# Patient Record
Sex: Male | Born: 1935 | Race: White | Hispanic: No | Marital: Married | Smoking: Never smoker
Health system: Southern US, Community
[De-identification: ages and names within clinical notes are randomized; demographics above are authoritative.]

## PROBLEM LIST (undated history)

## (undated) DIAGNOSIS — N183 Chronic kidney disease, stage 3 (moderate): Secondary | ICD-10-CM

## (undated) DIAGNOSIS — F0280 Dementia in other diseases classified elsewhere without behavioral disturbance: Secondary | ICD-10-CM

## (undated) DIAGNOSIS — Z951 Presence of aortocoronary bypass graft: Secondary | ICD-10-CM

## (undated) DIAGNOSIS — J9690 Respiratory failure, unspecified, unspecified whether with hypoxia or hypercapnia: Secondary | ICD-10-CM

## (undated) DIAGNOSIS — I779 Disorder of arteries and arterioles, unspecified: Secondary | ICD-10-CM

## (undated) DIAGNOSIS — R9430 Abnormal result of cardiovascular function study, unspecified: Secondary | ICD-10-CM

## (undated) DIAGNOSIS — E785 Hyperlipidemia, unspecified: Secondary | ICD-10-CM

## (undated) DIAGNOSIS — I509 Heart failure, unspecified: Secondary | ICD-10-CM

## (undated) DIAGNOSIS — I739 Peripheral vascular disease, unspecified: Secondary | ICD-10-CM

## (undated) DIAGNOSIS — G301 Alzheimer's disease with late onset: Secondary | ICD-10-CM

## (undated) DIAGNOSIS — E039 Hypothyroidism, unspecified: Secondary | ICD-10-CM

## (undated) DIAGNOSIS — E1140 Type 2 diabetes mellitus with diabetic neuropathy, unspecified: Secondary | ICD-10-CM

## (undated) DIAGNOSIS — I2510 Atherosclerotic heart disease of native coronary artery without angina pectoris: Secondary | ICD-10-CM

## (undated) DIAGNOSIS — R0602 Shortness of breath: Secondary | ICD-10-CM

## (undated) DIAGNOSIS — I10 Essential (primary) hypertension: Secondary | ICD-10-CM

## (undated) DIAGNOSIS — IMO0002 Reserved for concepts with insufficient information to code with codable children: Secondary | ICD-10-CM

## (undated) DIAGNOSIS — E119 Type 2 diabetes mellitus without complications: Secondary | ICD-10-CM

## (undated) HISTORY — DX: Dementia in other diseases classified elsewhere without behavioral disturbance: F02.80

## (undated) HISTORY — PX: CARPAL TUNNEL RELEASE: SHX101

## (undated) HISTORY — DX: Atherosclerotic heart disease of native coronary artery without angina pectoris: I25.10

## (undated) HISTORY — DX: Heart failure, unspecified: I50.9

## (undated) HISTORY — DX: Abnormal result of cardiovascular function study, unspecified: R94.30

## (undated) HISTORY — DX: Presence of aortocoronary bypass graft: Z95.1

## (undated) HISTORY — DX: Type 2 diabetes mellitus without complications: E11.9

## (undated) HISTORY — DX: Hyperlipidemia, unspecified: E78.5

## (undated) HISTORY — DX: Type 2 diabetes mellitus with diabetic neuropathy, unspecified: E11.40

## (undated) HISTORY — DX: Alzheimer's disease with late onset: G30.1

## (undated) HISTORY — DX: Reserved for concepts with insufficient information to code with codable children: IMO0002

## (undated) HISTORY — DX: Chronic kidney disease, stage 3 (moderate): N18.3

## (undated) HISTORY — DX: Respiratory failure, unspecified, unspecified whether with hypoxia or hypercapnia: J96.90

## (undated) HISTORY — PX: KNEE ARTHROPLASTY: SHX992

## (undated) HISTORY — DX: Hypothyroidism, unspecified: E03.9

## (undated) HISTORY — PX: CORONARY ARTERY BYPASS GRAFT: SHX141

## (undated) HISTORY — DX: Peripheral vascular disease, unspecified: I73.9

## (undated) HISTORY — DX: Shortness of breath: R06.02

## (undated) HISTORY — DX: Disorder of arteries and arterioles, unspecified: I77.9

## (undated) HISTORY — DX: Essential (primary) hypertension: I10

---

## 2000-03-29 DIAGNOSIS — I2510 Atherosclerotic heart disease of native coronary artery without angina pectoris: Secondary | ICD-10-CM

## 2000-03-29 HISTORY — DX: Atherosclerotic heart disease of native coronary artery without angina pectoris: I25.10

## 2000-04-22 ENCOUNTER — Ambulatory Visit (HOSPITAL_COMMUNITY): Admission: RE | Admit: 2000-04-22 | Discharge: 2000-04-22 | Payer: Self-pay | Admitting: Family Medicine

## 2000-04-22 ENCOUNTER — Encounter: Payer: Self-pay | Admitting: Family Medicine

## 2001-08-16 ENCOUNTER — Encounter: Payer: Self-pay | Admitting: Orthopedic Surgery

## 2001-08-16 ENCOUNTER — Encounter: Admission: RE | Admit: 2001-08-16 | Discharge: 2001-08-16 | Payer: Self-pay | Admitting: Orthopedic Surgery

## 2001-10-17 ENCOUNTER — Encounter: Payer: Self-pay | Admitting: Orthopedic Surgery

## 2001-10-26 ENCOUNTER — Inpatient Hospital Stay (HOSPITAL_COMMUNITY): Admission: RE | Admit: 2001-10-26 | Discharge: 2001-10-27 | Payer: Self-pay | Admitting: Orthopedic Surgery

## 2004-12-11 ENCOUNTER — Ambulatory Visit: Payer: Self-pay | Admitting: Cardiology

## 2006-04-28 ENCOUNTER — Ambulatory Visit: Payer: Self-pay | Admitting: Cardiology

## 2006-05-17 ENCOUNTER — Ambulatory Visit (HOSPITAL_BASED_OUTPATIENT_CLINIC_OR_DEPARTMENT_OTHER): Admission: RE | Admit: 2006-05-17 | Discharge: 2006-05-17 | Payer: Self-pay | Admitting: Urology

## 2006-06-30 ENCOUNTER — Inpatient Hospital Stay (HOSPITAL_COMMUNITY): Admission: EM | Admit: 2006-06-30 | Discharge: 2006-07-14 | Payer: Self-pay | Admitting: Emergency Medicine

## 2006-06-30 ENCOUNTER — Ambulatory Visit: Payer: Self-pay | Admitting: Cardiology

## 2006-06-30 ENCOUNTER — Ambulatory Visit: Payer: Self-pay | Admitting: Pulmonary Disease

## 2006-07-01 ENCOUNTER — Encounter: Payer: Self-pay | Admitting: Cardiology

## 2006-07-11 ENCOUNTER — Ambulatory Visit: Payer: Self-pay | Admitting: Physical Medicine & Rehabilitation

## 2006-08-09 ENCOUNTER — Encounter: Admission: RE | Admit: 2006-08-09 | Discharge: 2006-09-09 | Payer: Self-pay | Admitting: Family Medicine

## 2006-09-15 ENCOUNTER — Ambulatory Visit: Payer: Self-pay | Admitting: Critical Care Medicine

## 2007-04-14 ENCOUNTER — Ambulatory Visit (HOSPITAL_BASED_OUTPATIENT_CLINIC_OR_DEPARTMENT_OTHER): Admission: RE | Admit: 2007-04-14 | Discharge: 2007-04-14 | Payer: Self-pay | Admitting: Orthopedic Surgery

## 2007-06-02 ENCOUNTER — Ambulatory Visit (HOSPITAL_BASED_OUTPATIENT_CLINIC_OR_DEPARTMENT_OTHER): Admission: RE | Admit: 2007-06-02 | Discharge: 2007-06-02 | Payer: Self-pay | Admitting: Orthopedic Surgery

## 2007-06-13 ENCOUNTER — Ambulatory Visit: Payer: Self-pay | Admitting: Cardiology

## 2008-06-14 ENCOUNTER — Encounter: Payer: Self-pay | Admitting: Cardiology

## 2008-06-14 ENCOUNTER — Ambulatory Visit: Payer: Self-pay | Admitting: Cardiology

## 2008-11-19 ENCOUNTER — Encounter (INDEPENDENT_AMBULATORY_CARE_PROVIDER_SITE_OTHER): Payer: Self-pay | Admitting: *Deleted

## 2009-02-03 ENCOUNTER — Encounter: Payer: Self-pay | Admitting: Cardiology

## 2009-02-03 DIAGNOSIS — E119 Type 2 diabetes mellitus without complications: Secondary | ICD-10-CM | POA: Insufficient documentation

## 2009-02-04 ENCOUNTER — Ambulatory Visit: Payer: Self-pay | Admitting: Cardiology

## 2009-03-14 ENCOUNTER — Telehealth: Payer: Self-pay | Admitting: Cardiology

## 2009-03-31 ENCOUNTER — Ambulatory Visit: Payer: Self-pay | Admitting: Cardiology

## 2009-04-01 ENCOUNTER — Telehealth (INDEPENDENT_AMBULATORY_CARE_PROVIDER_SITE_OTHER): Payer: Self-pay | Admitting: *Deleted

## 2009-04-02 ENCOUNTER — Ambulatory Visit: Payer: Self-pay | Admitting: Cardiology

## 2009-04-02 ENCOUNTER — Ambulatory Visit: Payer: Self-pay

## 2009-04-02 ENCOUNTER — Encounter (HOSPITAL_COMMUNITY): Admission: RE | Admit: 2009-04-02 | Discharge: 2009-06-02 | Payer: Self-pay | Admitting: Cardiology

## 2009-04-24 ENCOUNTER — Ambulatory Visit: Payer: Self-pay | Admitting: Cardiology

## 2009-04-24 ENCOUNTER — Ambulatory Visit (HOSPITAL_COMMUNITY): Admission: RE | Admit: 2009-04-24 | Discharge: 2009-04-24 | Payer: Self-pay | Admitting: Cardiology

## 2009-04-24 ENCOUNTER — Ambulatory Visit: Payer: Self-pay

## 2009-04-24 ENCOUNTER — Encounter: Payer: Self-pay | Admitting: Cardiology

## 2010-04-14 ENCOUNTER — Encounter: Payer: Self-pay | Admitting: Cardiology

## 2010-04-15 ENCOUNTER — Encounter: Payer: Self-pay | Admitting: Cardiology

## 2010-04-15 ENCOUNTER — Ambulatory Visit
Admission: RE | Admit: 2010-04-15 | Discharge: 2010-04-15 | Payer: Self-pay | Source: Home / Self Care | Attending: Cardiology | Admitting: Cardiology

## 2010-04-27 ENCOUNTER — Encounter: Payer: Self-pay | Admitting: Cardiology

## 2010-04-27 ENCOUNTER — Ambulatory Visit: Admission: RE | Admit: 2010-04-27 | Discharge: 2010-04-27 | Payer: Self-pay | Source: Home / Self Care

## 2010-04-28 NOTE — Miscellaneous (Signed)
  Clinical Lists Changes  Problems: Added new problem of SHORTNESS OF BREATH (ICD-786.05) Added new problem of CORONARY ARTERY BYPASS GRAFT, HX OF (ICD-V45.81) Observations: Added new observation of PRIMARY MD: Paulita Cradle (03/31/2009 9:47)

## 2010-04-28 NOTE — Assessment & Plan Note (Signed)
Summary: F/U MYOVIEW 04-02-09  Medications Added METOPROLOL SUCCINATE 50 MG XR24H-TAB (METOPROLOL SUCCINATE) 1/2 tab two times a day      Allergies Added: NKDA  Visit Type:  Follow-up Primary Provider:  Paulita Cradle FNP   History of Present Illness: Patient is seen for cardiology followup.  He had some shortness of breath.  We know he has coronary disease.  He underwent a nuclear exercise test.  This showed no ischemia.  There was questionable wall motion abnormality.  2-D echo was done to reassess LV function and valvular function.  I reviewed this study myself today.  Patient has some mild dyskinesis of the basal posterior wall.  The other walls move well.  The ejection fraction is well maintained.  There are no major valvular abnormalities.  Patient also had a chest x-ray after the last visit.  There were no acute abnormalities.  Current Medications (verified): 1)  Lovastatin 20 Mg Tabs (Lovastatin) .... Take 1 Tab By Mouth Daily 2)  Glimepiride 2 Mg Tabs (Glimepiride) .Marland Kitchen.. 1 By Mouth Daily 3)  Metoprolol Succinate 50 Mg Xr24h-Tab (Metoprolol Succinate) .... 1/2 Tab Two Times A Day 4)  Multivitamins   Tabs (Multiple Vitamin) .Marland Kitchen.. 1 Tab Once Daily 5)  Aspirin 325 Mg  Tabs (Aspirin) .... Qd  Allergies (verified): No Known Drug Allergies  Past History:  Past Medical History: Hypertension Hyperlipidemia CAD   ... Cardiolite... 2002.. no ischemia /  nuclear.. January, 2011... no ischemia EF 50-55%... echo... April, 2008  /  echo.... January, 2011   report pending CABG... 1998 Diabetes diabetic neuropathy. Marland Kitchen.Possible vent dependent respiratory failure.... acquired pneumonia... 2008     Review of Systems       Patient denies fever, chills, headache, rash, sweats, change in vision, change in hearing, chest pain, nausea vomiting, urinary symptoms, musculoskeletal problems.  He does admit that he is gaining weight.  All of the systems are reviewed and are negative.  Vital  Signs:  Patient profile:   77 year old male Height:      69 inches Weight:      214 pounds Pulse rate:   64 / minute BP sitting:   138 / 68  (left arm) Cuff size:   regular  Vitals Entered By: Hardin Negus, RMA (April 24, 2009 11:11 AM)  Physical Exam  General:  patient is very stable in general. Eyes:  no xanthelasma. Neck:  no jugular venous distention. Lungs:  lungs are clear.  Respiratory effort is nonlabored. Heart:  cardiac exam reveals S1-S2.  There are no clicks or significant murmurs. Abdomen:  abdomen is soft.  He has gained weight. Extremities:  no peripheral edema. Psych:  patient is oriented to person time and place.  Affect is normal.  He is here with his son today.   Impression & Recommendations:  Problem # 1:  SHORTNESS OF BREATH (ICD-786.05)  His updated medication list for this problem includes:    Metoprolol Succinate 50 Mg Xr24h-tab (Metoprolol succinate) .Marland Kitchen... 1/2 tab two times a day    Aspirin 325 Mg Tabs (Aspirin) ..... Qd I have discussed the findings of the nuclear scan and the echo and the x-ray with the patient and his son.  There is no obvious cardiac problem at this time.  I've encouraged him to lose weight and to increase his activity.  I see him back in one year.  Problem # 2:  ESSENTIAL HYPERTENSION, BENIGN (ICD-401.1)  His updated medication list for this problem includes:  Metoprolol Succinate 50 Mg Xr24h-tab (Metoprolol succinate) .Marland Kitchen... 1/2 tab two times a day    Aspirin 325 Mg Tabs (Aspirin) ..... Qd Blood pressure is controlled today.  No change in therapy.  Problem # 3:  HYPERLIPIDEMIA (ICD-272.4)  The following medications were removed from the medication list:    Lipitor 40 Mg Tabs (Atorvastatin calcium) .Marland Kitchen... 1 tab once daily His updated medication list for this problem includes:    Lovastatin 20 Mg Tabs (Lovastatin) .Marland Kitchen... Take 1 tab by mouth daily Patient is receiving medication for his lipids.  Patient Instructions: 1)   Follow up in 1 year

## 2010-04-28 NOTE — Assessment & Plan Note (Signed)
Summary: Cardiology Nuclear Study  Nuclear Med Background Indications for Stress Test: Evaluation for Ischemia, Graft Patency   History: CABG, Echo, Heart Catheterization, Myocardial Perfusion Study  History Comments: '98 CABG x 5; '02 [id number],  EF=54%; '08 Echo:EF=50-55%  Symptoms: DOE, Fatigue, SOB    Nuclear Pre-Procedure Cardiac Risk Factors: Family History - CAD, Hypertension, Lipids, NIDDM, Obesity Caffeine/Decaff Intake: None NPO After: 8:30 PM Lungs: Clear IV 0.9% NS with Angio Cath: 22g     IV Site: (R) AC IV Started by: Irean Hong RN Chest Size (in) 44     Height (in): 69 Weight (lb): 210 BMI: 31.12  Nuclear Med Study 1 or 2 day study:  1 day     Stress Test Type:  Eugenie Birks Reading MD:  Marca Ancona, MD     Referring MD:  Willa Rough, MD Resting Radionuclide:  Technetium 36m Tetrofosmin     Resting Radionuclide Dose:  10.8 mCi  Stress Radionuclide:  Technetium 87m Tetrofosmin     Stress Radionuclide Dose:  33.0 mCi   Stress Protocol      Predicted Max HR:  132 bpm   Lexiscan: 0.4 mg   Stress Test Technologist:  Rea College CMA-N     Nuclear Technologist:  Burna Mortimer Deal RT-N  Rest Procedure  Myocardial perfusion imaging was performed at rest 45 minutes following the intravenous administration of Myoview Technetium 49m Tetrofosmin.  Stress Procedure  The patient received IV Lexiscan 0.4 mg over 15-seconds.  Myoview injected at 30-seconds.  There were no significant changes with infusion.  Quantitative spect images were obtained after a 45 minute delay.  QPS Raw Data Images:  Normal; no motion artifact; normal heart/lung ratio. Stress Images:  NI: Uniform and normal uptake of tracer in all myocardial segments. Rest Images:  Normal homogeneous uptake in all areas of the myocardium. Subtraction (SDS):  There is no evidence of scar or ischemia. Transient Ischemic Dilatation:  .98  (Normal <1.22)  Lung/Heart Ratio:  .32  (Normal <0.45)  Quantitative  Gated Spect Images QGS EDV:  102 ml QGS ESV:  52 ml QGS EF:  49 % QGS cine images:  Inferior and inferoseptal hypokinesis.    Overall Impression  Exercise Capacity: Lexiscan study.  BP Response: Normal blood pressure response. Clinical Symptoms: Nausea ECG Impression: No significant ST segment change suggestive of ischemia. Overall Impression: No ischemia or infarction on perfusion images.  Gated images show mildly decreased systolic function with inferior and inferoseptal hypokinesis.  Overall Impression Comments: Suggest echo to further assess LV function and wall motion.   Appended Document: Cardiology Nuclear Study [place]  Appended Document: Cardiology Nuclear Study pt aware, will order echo   Clinical Lists Changes  Orders: Added new Referral order of Echocardiogram (Echo) - Signed

## 2010-04-28 NOTE — Miscellaneous (Signed)
Summary: Orders Update  Clinical Lists Changes  Orders: Added new Test order of T-2 View CXR (71020TC) - Signed 

## 2010-04-28 NOTE — Progress Notes (Signed)
Summary: Nuclear Pre-Procedure  Phone Note Outgoing Call Call back at [facility] Phone [phone]   Call placed by: Stanton Kidney, EMT-P,  April 01, 2009 12:07 PM Call placed to: Patient Action Taken: Phone Call Completed Summary of Call: Reviewed information on Myoview Information Sheet (see scanned document for further details).  Spoke with Patient.    Nuclear Med Background Indications for Stress Test: Evaluation for Ischemia, Graft Patency   History: CABG, Echo, Myocardial Perfusion Study  History Comments: '98 CABG '02 MPS: EF=54%, (-) ischemisa '08 Echo: EF=50-55%  Symptoms: DOE, SOB    Nuclear Pre-Procedure Cardiac Risk Factors: Family History - CAD, Hypertension, Lipids, NIDDM Height (in): 69

## 2010-04-28 NOTE — Assessment & Plan Note (Signed)
Summary: c/o sob      Allergies Added: NKDA  Visit Type:  Follow-up Primary Provider:  Paulita Cradle FNP  CC:  shortness of breath.  History of Present Illness: The patient is here for cardiology followup.  He has coronary disease.  He has had some shortness of breath.  He is here with a younger brother who says that his suppressed limited.  The patient has not been having any chest pain.  He is a heart surgery was done in 1998.  Ejection fraction was 5055% in April, 2008.  He did have an episode of vent dependent respiratory failure with acquired pneumonia in 2008. He is not having any chest discomfort.   Current Medications (verified): 1)  Lovastatin 20 Mg Tabs (Lovastatin) .... Take 1 Tab By Mouth Daily 2)  Glimepiride 2 Mg Tabs (Glimepiride) .Marland Kitchen.. 1 By Mouth Daily 3)  Metoprolol Succinate 50 Mg Xr24h-Tab (Metoprolol Succinate) .... 1/2 Tab Once Daily 4)  Multivitamins   Tabs (Multiple Vitamin) .Marland Kitchen.. 1 Tab Once Daily 5)  Aspirin 325 Mg  Tabs (Aspirin) .... Qd 6)  Lipitor 40 Mg Tabs (Atorvastatin Calcium) .Marland Kitchen.. 1 Tab Once Daily 7)  Tylenol 325 Mg Tabs (Acetaminophen) .... As Needed  Allergies (verified): No Known Drug Allergies  Past History:  Past Medical History: Last updated: 02/03/2009 Hypertension Hyperlipidemia CAD   ... Cardiolite... 2002.. no ischemia EF 50-55%... echo... April, 2008 CABG... 1998 Diabetes diabetic neuropathy. Marland Kitchen.Possible vent dependent respiratory failure.... acquired pneumonia... 2008     Review of Systems       Patient denies fever, chills, headache, sweats, rash, change in vision, change in hearing, chest pain, cough, nausea vomiting, urinary symptoms.  He is not complaining of musculoskeletal problems.  All other systems are reviewed and are negative.  Vital Signs:  Patient profile:   77 year old male Height:      69 inches Weight:      212 pounds Pulse rate:   69 / minute BP sitting:   124 / 66  (left arm) Cuff size:   large  Vitals  Entered By: Oswald Hillock (March 31, 2009 2:27 PM)  Physical Exam  General:  The patient is stable but overweight. Head:  head is atraumatic. Eyes:  no xanthelasma. Neck:  no jugular venous distention. Chest Wall:  no chest wall tenderness. Lungs:  lungs are clear.  Respiratory effort is nonlabored. Heart:  cardiac exam reveals S1 and S2.  No clicks or significant murmurs. Abdomen:  abdomen is obese but soft. Msk:  no musculoskeletal deformities. Extremities:  no peripheral edema. Skin:  no skin rashes. Psych:  patient is oriented to person time and place affect is normal.   Impression & Recommendations:  Problem # 1:  CORONARY ARTERY BYPASS GRAFT, HX OF (ICD-V45.81)  The patient has coronary disease.  He has not been having any chest pain.  His ejection fraction was normal 2 years ago.  Orders: Nuclear Stress Test (Nuc Stress Test)  Problem # 2:  SHORTNESS OF BREATH (ICD-786.05)  At this point etiology of his shortness of breath is not clear.  This may well be a limitation from pulmonary disease.  However he does have significant coronary disease by history and his symptoms are exertional.  I carefully reviewed the pros and cons of proceeding with exercise testing.  I do believe that we should do a pharmacologic scan to be sure that we are not dealing with significant ischemia.  This will be arranged he'll also have a  chest x-ray.  He'll then see him for follow up.  Orders: Nuclear Stress Test (Nuc Stress Test)  Patient Instructions: 1)  Your physician has requested that you have a Lexiscan  myoview.  For further information please visit [email or web address]  Please follow instruction sheet, as given. 2)  A chest x-ray takes a picture of the organs and structures inside the chest, including the heart, lungs, and blood vessels. This test can show several things, including, whether the heart is enlarged; whether fluid is building up in the lungs; and whether pacemaker /  defibrillator leads are still in place.  Will need xray same day as myoview. 3)  Follow up in late January

## 2010-04-28 NOTE — Miscellaneous (Signed)
Summary: Outpatient Coinsurance Notice  Outpatient Coinsurance Notice   Imported By: Marylou Mccoy 04/08/2009 16:03:11  _____________________________________________________________________  External Attachment:    Type:   Image     Comment:   External Document

## 2010-04-30 NOTE — Miscellaneous (Signed)
  Clinical Lists Changes  Observations: Added new observation of PAST MED HX: Hypertension Hyperlipidemia CAD   ... Cardiolite... 2002.. no ischemia /  nuclear.. January, 2011... no ischemia EF 50-55%... echo... April, 2008  /  echo.... January, 2011.Marland KitchenMarland KitchenEF 50-55%.. hypokinesis  mid-basal inferoposterior  CABG... 1998 Diabetes diabetic neuropathy. Marland Kitchen.Possible vent dependent respiratory failure.... acquired pneumonia... 2008    (04/14/2010 16:18) Added new observation of PRIMARY MD: Paulita Cradle FNP (04/14/2010 16:18)       Past History:  Past Medical History: Hypertension Hyperlipidemia CAD   ... Cardiolite... 2002.. no ischemia /  nuclear.. January, 2011... no ischemia EF 50-55%... echo... April, 2008  /  echo.... January, 2011.Marland KitchenMarland KitchenEF 50-55%.. hypokinesis  mid-basal inferoposterior  CABG... 1998 Diabetes diabetic neuropathy. Marland Kitchen.Possible vent dependent respiratory failure.... acquired pneumonia... 2008

## 2010-04-30 NOTE — Assessment & Plan Note (Signed)
Summary: f1y  Medications Added ETODOLAC 200 MG CAPS (ETODOLAC) as needed      Allergies Added: NKDA  Visit Type:  Follow-up Primary Provider:  Paulita Cradle FNP  CC:  CAD.  History of Present Illness: The patient is doing remarkably well.  He's had some slight dizziness when standing up.  He's not had any chest pain.  There's been no significant shortness of breath and I saw him last January, 2011.  He had some shortness of breath and we reevaluated him to be sure that it was not ischemia.  He had a nuclear scan showing no ischemia.  2-D echo revealed good LV function overall with an ejection fraction of 50-55% there was hypokinesis of the mid and basal inferoposterior wall.  Current Medications (verified): 1)  Lovastatin 20 Mg Tabs (Lovastatin) .... Take 1 Tab By Mouth Daily 2)  Glimepiride 2 Mg Tabs (Glimepiride) .Marland Kitchen.. 1 By Mouth Daily 3)  Metoprolol Succinate 50 Mg Xr24h-Tab (Metoprolol Succinate) .... 1/2 Tab Two Times A Day 4)  Multivitamins   Tabs (Multiple Vitamin) .Marland Kitchen.. 1 Tab Once Daily 5)  Aspirin 325 Mg  Tabs (Aspirin) .... Qd 6)  Etodolac 200 Mg Caps (Etodolac) .... As Needed  Allergies (verified): No Known Drug Allergies  Past History:  Past Medical History: Hypertension Hyperlipidemia CAD   ... Cardiolite... 2002.. no ischemia /  nuclear.. January, 2011... no ischemia EF 50-55%... echo... April, 2008  /  echo.... January, 2011.Marland KitchenMarland KitchenEF 50-55%.. hypokinesis  mid-basal inferoposterior  CABG... 1998 Diabetes diabetic neuropathy. Marland Kitchen.Possible vent dependent respiratory failure.... acquired pneumonia... 2008 Soft right carotid bruit     Past Surgical History: CABG Knee Arthroplasty-Total  Review of Systems       Patient denies fever, chills, headache, sweats, rash, change in vision, change in hearing, chest pain, cough, nausea vomiting, urinary symptoms.  All other systems are reviewed and are negative.  Vital Signs:  Patient profile:   77 year old male Height:       69 inches Weight:      214 pounds BMI:     31.72 Pulse rate:   54 / minute BP sitting:   136 / 64  (left arm) Cuff size:   large  Vitals Entered By: Hardin Negus, RMA (April 15, 2010 11:16 AM)  Physical Exam  General:  patient looks great. Head:  head is atraumatic. Eyes:  no xanthelasma. Neck:  no jugular venous distention.  There is a soft right carotid bruit. Chest Wall:  no chest wall tenderness. Lungs:  lungs are clear.  Respiratory effort is nonlabored. Heart:  cardiac exam reveals S1 and S2.  No clicks or significant murmurs. Abdomen:  abdomen is soft. Msk:  no musculoskeletal deformities. Extremities:  no peripheral edema. Skin:  no skin rashes. Psych:  patient is oriented to person time and place.  Affect is normal.   Impression & Recommendations:  Problem # 1:  * SOFT RIGHT CAROTID BRUIT The patient has known vascular disease.  I cannot document when a carotid Doppler was done last.  He is fully active.  Carotid Doppler will be done. I will be in touch with him with the results.  Problem # 2:  SHORTNESS OF BREATH (ICD-786.05)  His updated medication list for this problem includes:    Metoprolol Succinate 50 Mg Xr24h-tab (Metoprolol succinate) .Marland Kitchen... 1/2 tab two times a day    Aspirin 325 Mg Tabs (Aspirin) ..... Qd He is not having any significant shortness of breath.  No further workup.  Problem # 3:  ESSENTIAL HYPERTENSION, BENIGN (ICD-401.1)  His updated medication list for this problem includes:    Metoprolol Succinate 50 Mg Xr24h-tab (Metoprolol succinate) .Marland Kitchen... 1/2 tab two times a day    Aspirin 325 Mg Tabs (Aspirin) ..... Qd Blood pressure is well-controlled.  No change in therapy.  Problem # 4:  HYPERLIPIDEMIA (ICD-272.4)  His updated medication list for this problem includes:    Lovastatin 20 Mg Tabs (Lovastatin) .Marland Kitchen... Take 1 tab by mouth daily Lipids are treated.  No change in therapy.  Problem # 5:  CAD (ICD-414.00)  His updated  medication list for this problem includes:    Metoprolol Succinate 50 Mg Xr24h-tab (Metoprolol succinate) .Marland Kitchen... 1/2 tab two times a day    Aspirin 325 Mg Tabs (Aspirin) ..... Qd  Orders: EKG w/ Interpretation (93000) Coronary disease is stable.  EKG is done today and reviewed by me.  There is sinus bradycardia.  There are nonspecific ST-T wave changes which are unchanged.  He is not having any symptoms of bradycardia.  I've chosen not to change his beta blocker dose.  We'll see him back for cardiology follow up in one year.  Other Orders: Carotid Duplex (Carotid Duplex)  Patient Instructions: 1)  Your physician has requested that you have a carotid duplex. This test is an ultrasound of the carotid arteries in your neck. It looks at blood flow through these arteries that supply the brain with blood. Allow one hour for this exam. There are no restrictions or special instructions. 2)  Your physician wants you to follow-up in: 1 year.  You will receive a reminder letter in the mail two months in advance. If you don't receive a letter, please call our office to schedule the follow-up appointment.

## 2010-08-11 NOTE — Assessment & Plan Note (Signed)
[facility]                            CARDIOLOGY OFFICE NOTE   NAME:Canning, SHAWNA KIENER                        MRN:          [id number]  DATE:06/13/2007                            DOB:          07/07/1918    Mr. Macari a returns today for cardiology follow-up.  He is a delightful  77 year old gentleman.  I had seen him last in January 2008 and he is  here for yearly cardiology follow-up.  He underwent CABG in 1998.  He  had a Cardiolite in 2002.  He continues to remain active.  He had had a  major illness in the spring of 2008 with a vent-dependent community-  acquired pneumonia and many other complicating issues.  He stabilized.  He is here today looking well.  He traveled and brought himself by car  from [place].   He is not having any chest pain.  There has been no marked shortness of  breath, syncope or presyncope.  He mentions that he has trace edema at  times.   PAST MEDICAL HISTORY:  Allergies:  No known drug allergies.   Medications:  1. Metoprolol 25 mg b.i.d.  2. Lipitor 20 mg.  3. Glimepiride 2 mg.  4. Once a day vitamin.  5. Ecotrin 325 mg.   Other medical problems:  See the list below.   REVIEW OF SYSTEMS:  Overall he is doing well.  His review of systems is  negative.   PHYSICAL EXAM:  Blood pressure 140/72 with a pulse of 60.  His weight is  205 pounds.  This is a reasonable weight for him.  He is up a few pounds  from his last visit.  Certainly, losing weight overall would be good for  him.  The patient is oriented to person, time and place.  Affect is normal.  HEENT:  No xanthelasma.  He has normal extraocular motion.  LUNGS:  Clear.  Respiratory effort is not labored.  CARDIAC:  An S1 with an S2.  There are no clicks or significant murmurs.  He did have some irregularity to the rhythm.  ABDOMEN:  Soft.  He had no significant peripheral edema.   EKG revealed that he was in sinus rhythm.  He does have PVCs.  He has  old  diffuse ST-T wave changes.   PROBLEMS:  1. Hypertension, treated.  2. Hyperlipidemia, treated.  3. Coronary disease, post coronary artery bypass grafting in 1998 and      a Cardiolite in 2002 that looked good.  4. Diabetes, treated.  5. Possible diabetic neuropathy.  6. Significant pulmonary illness in the spring of 2008.   Overall he is doing well.  No change in his therapy.  I will see him  back in a year.     Luis Abed, MD, [facility]  Electronically Signed    JDK/MedQ  DD: 06/14/2007  DT: 06/14/2007  Job #: [id number]   cc:   Ernestina Penna, M.D.

## 2010-08-11 NOTE — Op Note (Signed)
NAME:  [name] [name], [name] [name] NO.:  [id number]   MEDICAL RECORD NO.:  [id number]          PATIENT TYPE:  AMB   LOCATION:  DSC                          FACILITY:  [facility]   PHYSICIAN:  Katy Fitch. Sypher, M.D. DATE OF BIRTH:  June 22, 1918   DATE OF PROCEDURE:  06/02/2007  DATE OF DISCHARGE:                               OPERATIVE REPORT   PREOPERATIVE DIAGNOSIS:  Chronic left median entrapment neuropathy at  carpal tunnel.   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   OPERATION:  Release of left transverse carpal ligament.   SURGEON:  Katy Fitch. Sypher, M.D.   ASSISTANT:  [name] [name], P.A.-C.   ANESTHESIA:  IV regional.   SUPERVISING ANESTHESIOLOGIST:  Janetta Hora. Gelene Mink, M.D.   INDICATIONS FOR PROCEDURE:  [name] [name] is an 77 year old gentleman  referred by [facility] for evaluation and  management of painful left carpal tunnel syndrome.  He has a history of  significant hand numbness and stiffness.  He is status post release of  his right transverse carpal ligament on April 14, 2007, with  satisfactory short term results.  He was noted to have a persistent  median artery on the right.  He now presents for identical surgery on  the left. Preoperatively, he was reminded of the potential risks and  benefits of surgery.  After his right side surgery, he only used a  minimal amount of pain medication and still has the bulk of his pain  prescription medication provided in January.  He is brought to the  operating room at this time.   PROCEDURE:  [name] [name] is brought to the operating room and placed  in the supine position upon the operating table.  An IV regional block  was placed on the left utilizing a tourniquet pressure between 300 and  320 mmHg due to systolic hypertension and presumed vascular  calcification.  After ten minutes, satisfactory anesthesia was achieved.  Similar to the  right side, we proceed with a short incision in the line  of the ring finger in the palm.  The subcutaneous tissues were carefully  divided around the palmar fascia.  This was carefully released with  scissors, revealing the common sensory branch of the median nerve and  superficial palmar arch.   The common sensory branches were followed back to the median nerve  proper.  Once again, there was noted to be a persistent median artery  and accompanying veins.  A [place] 4 elevator was used to sound the  carpal canal followed by careful release of the transverse carpal  ligament along its ulnar border.  Bleeding points along the margin of  the released ligament and transverse vessels were electrocauterized with  bipolar forceps.  The volar forearm fascia was, likewise, released  subcutaneously extending into the distal forearm.  This widely opened  the carpal canal.  No mass or other predicaments noted.  The wound was  carefully inspected for bleeding points and  hemostasis achieved.  The wound was then repaired with intradermal 3-0  Prolene  and Steri-Strips.  2% lidocaine was infiltrated along the wound  margins for postoperative analgesia.  [name] was awakened from his  sedation and transferred to the recovery room with stable vital signs.      Katy Fitch Sypher, M.D.  Electronically Signed     RVS/MEDQ  D:  06/02/2007  T:  06/02/2007  Job:  [id number]

## 2010-08-11 NOTE — Op Note (Signed)
NAME:  [name] [name], ADD DINAPOLI NO.:  [id number]   MEDICAL RECORD NO.:  [id number]          PATIENT TYPE:  AMB   LOCATION:  DSC                          FACILITY:  [facility]   PHYSICIAN:  Katy Fitch. Sypher, M.D. DATE OF BIRTH:  03/03/1919   DATE OF PROCEDURE:  04/14/2007  DATE OF DISCHARGE:                               OPERATIVE REPORT   PREOPERATIVE DIAGNOSIS:  Severe right carpal tunnel syndrome.   POSTOPERATIVE DIAGNOSIS:  Severe right carpal tunnel syndrome.   OPERATION:  Release of right transverse carpal ligament.   OPERATING SURGEON:  Katy Fitch. Sypher, M.D.   ASSISTANT:  Marveen Reeks. Dasnoit PA-C.   ANESTHESIA:  IV regional, supplemented by IV sedation.   SUPERVISING ANESTHESIOLOGIST:  Judie Petit, M.D.   INDICATIONS:  Kalmen Gallego is an 77 year old gentleman referred through  the courtesy of [facility] for evaluation of  severe hand numbness and pain.  Clinical examination revealed thenar  atrophy and signs of profound carpal tunnel syndrome.  Electrodiagnostic  studies confirmed very severe right carpal tunnel syndrome.  This has  been unresponsive to splinting and activity modification, as well as  steroid injection into the ulnar bursa.   Due to a failure to respond to nonoperative measures, Mr. Oran is  brought to the operating room at this time for release of his right  transverse carpal ligament.   PROCEDURE:  Leveon Stroschein was brought to the operating room and placed in  the supine position upon the operating table.  Following placement of an  IV regional block by the anesthesia service, satisfactory anesthesia was  achieved within 10 minutes.  The right arm was prepped with Betadine  soap and solution and sterilely draped.  A tourniquet pressure of 300  mmHg was required due to systolic hypertension and probable vascular  calcification.   When anesthesia was satisfactory, the procedure commenced with a short  incision in the  line of the ring finger in the palm.  Subcutaneous  tissues were carefully divided, revealing the palmar fascia.  This was  split longitudinally to the common sensory branch of the median nerve.  These were followed back to the transverse carpal ligament, which was  gently isolated from the median nerve.  There was noted to be a  persistent median artery and accompanying veins on the volar surface of  the nerve.  The transverse carpal ligament was released along its ulnar  border, extending into the distal forearm.  This widely opened the  carpal canal.  The tenosynovium of the ulnar bursa was quite edematous  and fibrotic.  Bleeding points along the margin of the released ligament  were electrocauterized with bipolar current, followed by repair of the  skin with intradermal 3-0 Prolene suture.  A compressive dressing was  applied with a volar plaster splint, maintaining the wrist in 5 degrees  of dorsiflexion.   After 20 minutes of tourniquet time, the tourniquet that was released,  with immediate capillary refill to the fingers and thumb.  Mr. Briddell  tolerated the surgery and anesthesia well.  He will be transferred  to  recovery, and then home to the care of his son, with a prescription for  Vicodin 5 mg 1 p.o. q.4-6 h. p.r.n. pain, 20 tablets, without refill.   I will see him back for followup in 1 week for a dressing change and  initiation of an excise program.  We may begin therapy at approximately  2 weeks.      Katy Fitch Sypher, M.D.  Electronically Signed     RVS/MEDQ  D:  04/14/2007  T:  04/14/2007  Job:  [id number]   cc:   Ernestina Penna, M.D.

## 2010-08-11 NOTE — Assessment & Plan Note (Signed)
[facility]                             PULMONARY OFFICE NOTE   NAME:[name] [name], [name] [name]                        MRN:          [id number]  DATE:09/15/2006                            DOB:          1918-04-30    Mr. Dezeeuw is an 77 year old white male seen today as post-hospital  followup.  He was hospitalized between April 3 -17 with acute  respiratory failure, vent-dependent; community-acquired pneumonia;  pulmonary edema; hypertension, renal insufficiency; diabetes mellitus.  He has significant deconditioning following his extubation and was sent  to [facility].  He rehabbed there and recently was  discharged from this facility and now has a maintenance program  available to him.  He is off all pulmonary medications.  Maintains  metoprolol 25 mg b.i.d., Lipitor 20 mg daily, glimepiride daily,  vitamins daily, Ecotrin 325 mg daily.  Overall he has been doing well  with less shortness of breath, decreased cough.   ON EXAM:  VITAL SIGNS:  Temperature 97.  Blood pressure 134/58.  Pulse  49.  Saturation 97% on room air.  CHEST:  Showed distant breath sounds with no evidence of wheezes, rale,  or rhonchi.  CARDIAC EXAM:  Showed a regular rate and rhythm without S3.  Normal S1,  S2.  ABDOMEN:  Soft, nontender.  EXTREMITIES:  Showed no edema or clubbing.  SKIN:  Clear.   Recent chest x-ray showed no active disease process.   IMPRESSION:  Resolved pneumonia and respiratory failure with no primary  pulmonary disease.   PLAN:  Release this patient back to Dr. Christell Constant for further followup.  We  are available to see the patient on an as-needed basis.  Pneumovax was  given today in the office.     Charlcie Cradle Delford Field, MD, [facility]  Electronically Signed    PEW/MedQ  DD: 09/15/2006  DT: 09/15/2006  Job #: [id number]   cc:   Ernestina Penna, M.D.

## 2010-08-14 NOTE — H&P (Signed)
NAME:  [name] [name], [name] [name] NO.:  [id number]   MEDICAL RECORD NO.:  [id number]          PATIENT TYPE:  INP   LOCATION:  2110                         FACILITY:  [facility]   PHYSICIAN:  Isidor Holts, M.D.  DATE OF BIRTH:  08-Apr-1918   DATE OF ADMISSION:  06/30/2006  DATE OF DISCHARGE:                              HISTORY & PHYSICAL   PMD:  Dr. Vernon Prey [facility] practice.   PRIMARY CARDIOLOGIST:  Dr. Myrtis Ser.   CHIEF COMPLAINT:  Shortness of breath, cough, fever, transient vomiting  and diarrhea.   HISTORY OF PRESENT ILLNESS:  This is an 77 year old male. For past  medical history, see below.  According to the patient, he became unwell  on 06/24/2006 with nausea, vomiting and diarrhea. Also had chills. By  06/25/2006, he developed cough and shortness of breath and this got  progressively worse.  Initially he had refused according to his family,  to contact his primary MD. but, he had become increasingly weak and  spent most of the time in bed and by 2:00 a.m. on 06/29/2006, he felt so  unwell that he finally decided to come to the emergency department.   PAST MEDICAL HISTORY:  1. Coronary artery disease status post CABG 1998.  2. Hypertension.  3. Dyslipidemia.  4. Type 2 diabetes mellitus with neuropathy.  5. 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  6. Torn medial meniscus left knee status post arthroscopic partial      meniscectomy 09/2001.   MEDICATIONS:  1. Multivitamin one p.o. daily.  2. Enteric-coated aspirin 325 mg p.o. daily.  3. Lipitor 10 mg p.o. daily.  4. Niacin 500 mg p.o. daily.  5. Glimepiride 2 mg p.o. daily.  6. Metoprolol 25 mg p.o. b.i.d.   ALLERGIES:  NO KNOWN DRUG ALLERGIES.   REVIEW OF SYSTEMS:  As per HPI and chief complaint.  The patient denies  sick contacts, denies foreign travel or ingestion of unusual foods prior  to onset of symptoms.  There is also no clustering of cases.   SOCIAL HISTORY:  The patient is  married.  He works as a Chiropractor.  Nonsmoker, nondrinker.  Has no history of drug abuse.  Has three  offspring.  The patient's parents both had heart disease.  His mother  died at age 41 years from CHF.  His father died at age 61 years status  post MI.   PHYSICAL EXAMINATION:  VITALS:  Temperature 97.4, pulse 91 per minute  regular, respiratory rate 30, BP 120/70 mmHg, pulse oximeter 89% on room  air and 91% on 15 liters per minute with non-rebreather mask.  The  patient is mildly tachypneic at rest,  however, alert, communicative.  Denies chest pain.  HEENT: No clinical pallor, no jaundice.  No conjunctival injection.  Throat is clear.  NECK:  Supple.  JVP is elevated.  CHEST:  Bilateral diffuse crackles all over both lung fields. No  wheezes.  HEART:  S1 and S2 heard, normal regular.  No murmurs.  ABDOMEN:  Full, soft and nontender.  No palpable organomegaly.  No  palpable masses.  Normal bowel sounds, lower extremity examination,  minimal pitting edema, palpable peripheral pulses.  MUSCULOSKELETAL SYSTEM:  Not formally examined.  However, the patient  has noted osteoarthritic changes.  CENTRAL NERVOUS SYSTEM: No focal neurologic deficits on gross  examination.   INVESTIGATIONS/LABORATORY STUDIES:  Still pending at the time of this  evaluation.  Chest x-ray dated 06/30/2006 shows diffuse bilateral  airspace disease, query pneumonia versus pulmonary edema.   ASSESSMENT AND PLAN:  1. Bilateral community acquired pneumonia.  We shall admit the      patient.  Commence him on antibiotic coverage with Rocephin and      Azithromycin.  Send blood cultures for completeness.   1. CHF.  Certainly the patient clinically has superadded CHF.  We      shall manage with diuretics, however, we shall arrange 2-D      echocardiogram to assess LV function, do serial BNPs, do 12-lead      EKG and cycle cardiac enzymes.  The patient's primary cardiologist      is Dr. Myrtis Ser. We shall consult  accordingly.   1. Hypoxic respiratory failure.  This is secondary to #s 1 and 2,      above.  We shall manage as stated above. In addition, utilize      p.r.n. BiPAP, bronchodilator nebulizers.  We shall do arterial      blood gases emergently, and depending on findings, the patient may      require intubation.  We shall, therefore, admit the patient to the      intensive care unit as he is at high risk for intubation and      involve critical care medicine, if appropriate.   1. Type 2 diabetes mellitus, query control.  We shall manage with      sliding scale insulin coverage for now.   1. Gastroenteritis.  Transient. This was likely viral in origin, and      appears to have resolved.   Note: The patient is full code, per my discussion with him and his  family.   Further management will depend on clinical course.      Isidor Holts, M.D.  Electronically Signed     CO/MEDQ  D:  06/30/2006  T:  06/30/2006  Job:  [id number]   cc:   Ernestina Penna, M.D.  Luis Abed, MD, [facility]

## 2010-08-14 NOTE — Op Note (Signed)
TNAME:  [name] [name], [name] [name]                          ACCOUNT NO.:  [id number]   MEDICAL RECORD NO.:  [id number]                   PATIENT TYPE:  INP   LOCATION:  X006                                 FACILITY:  [facility]   PHYSICIAN:  Illene Labrador. [name], M.D.            DATE OF BIRTH:  19-Apr-1918   DATE OF PROCEDURE:  10/26/2001  DATE OF DISCHARGE:                                 OPERATIVE REPORT   PREOPERATIVE DIAGNOSES:  1. Torn medial meniscus.  2. Osteoarthritis, left knee.   POSTOPERATIVE DIAGNOSES:  1. Torn medial and lateral menisci.  2. Loose body.  3. 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   OPERATION:  Left knee arthroscopy with partial medial and lateral  meniscectomy.  Removal of  loose body.  Debridement of medial femoral  condyle.   SURGEON:  Illene Labrador. [name], M.D.   ASSISTANT:  Nurse.   ANESTHESIA:  General.   PATHOLOGY AND INDICATION FOR PROCEDURE:  He injured his left knee over a  year ago and because of persistent pain, had an MRI on Aug 16, 2001  demonstrating the torn medial meniscus as well as the chondromalacia of the  medial femoral condyle.  It was felt because of persistent pain and his  failure to respond to nonsurgical treatment that arthroscopic intervention  was indicated.   DESCRIPTION OF PROCEDURE:  After satisfactory general anesthesia, pneumatic  tourniquet applied but not inflated.  He had had previous vein harvesting  and also had no palpable pedal pulses.  The thigh stabilizer applied and  left knee was prepped with Duraprep and draped in a sterile field.  Superior  and medial saline inflow.  First, through an anteromedial portal, lateral  compartment of the knee joint was evaluated.  There was a moderate synovitis  present, which I resected.  He has a serrated tear around the middle two-  thirds of the lateral meniscus which I pictured and shaved down until smooth  with a 3.5 shaver.  Final pictures were  taken.  I was unable to look up at  the lateral gutter and suprapatellar area; he had an external rotation  deformity of his leg.  Finally, I reversed portals and after cleaning out  some synovium in the medial joint, found a loose body the size of the head  of the 3.5 shaver, which was attached to the medial synovium overlying the  midbody of the medial meniscus.  I resected this with the shaver as part of  additional synovectomy.  I suspect this took origin from a full-thickness  defect on the mid weightbearing portion of the medial femoral condyle, which  I smoothed down.  Posteriorly, he had an extensive tear of the posterior  curve, which was pictured and debrided back to a stable rim with small  baskets and shaved down until smooth with a 3.5 shaver.  I then looked up in  the medial gutter and suprapatellar area with no abnormalities noted.  There  was minimal wear of the patella.  The knee joint was then irrigated until  clear and all fluid possible removed.  The __________  two portals were  closed with 4-0 nylon.  Twenty cc of 0.5% Marcaine again with 4 mg  of morphine were then instilled through the inflow apparatus, which was  removed, and this portal closed with 4-0 nylon as well.  Betadine, Adaptic  and dry sterile dressing were applied.  He tolerated the procedure well and  the time of this dictation, to recovery in satisfactory condition with no  known complications.                                               [name] [name], M.D.    JPA/MEDQ  D:  10/26/2001  T:  11/01/2001  Job:  [id number]

## 2010-08-14 NOTE — Assessment & Plan Note (Signed)
[facility]                            CARDIOLOGY OFFICE NOTE   NAME:Pfeffer, RAJU COPPOLINO                        MRN:          [id number]  DATE:04/28/2006                            DOB:          Jul 08, 1918    Mr. Klas coronary disease and underwent CABG in the past.  He has been  quite stable.  His CABG was in 1998.  He had a Cardiolite in 2002.  He  remains active.  He works in his garden in the summer.  He goes about  full activities.  He is not having any chest pain.  He has no shortness  of breath.  He has not had any syncope or presyncope.   Patient needs to have a circumcision operation, and he is here for  cardiac clearance.   PAST MEDICAL HISTORY:   ALLERGIES:  NO KNOWN DRUG ALLERGIES.   MEDICATIONS:  1. Metoprolol 25 b.i.d.  2. Lipitor.  3. Glimepiride.  4. Vitamin.  5. Ecotrin.   OTHER MEDICAL PROBLEMS:  See the list below.   REVIEW OF SYSTEMS:  A careful review of systems reveals patient is not  having any significant problems.  He has newly diagnosed diabetes that  is being treated.  He had some mild numbness in his hands and feet that  may be related to his diabetes.  Otherwise, his review of systems is  negative.   PHYSICAL EXAMINATION:  Blood pressure is 160/70.  He will need blood  pressure followup.  His weight is 200 pounds.  Patient is oriented to person, time and place, and his affect is normal.  There is no xanthelasma.  There is normal extraocular motion.  There are no carotid bruits.  There is no jugular venous distention.  CARDIAC:  Exam reveals an S1 and S2.  There are no clicks or significant  murmurs.  ABDOMEN:  Soft.  There are no masses or bruits.  There is no peripheral edema.  Patient has 2+ pulse in his right foot,  and I do not feel pulse well in his left foot.  Both feet are warm.  EKG:  Shows diffuse old nonspecific ST, T wave changes, and his resting  heart rate is 50.   PROBLEMS:  1. Hypertension,  treated.  2. Hyperlipidemia, treated.  3. Coronary disease post coronary artery bypass graft in 1998 with a      Cardiolite in 2002 that looked good.  4. Diabetes.  5. Possible diabetic neuropathy.  6. Need for circumcision surgery.   Mr. Churchwell is stable.  I talked with him about the possibility of having  an adenosine Myoview scan.  He would prefer not to at this time.  Because his exercise level is good and he has no symptoms, I do not feel  that I need to push hard for him to have this study.  It has been over 5  years since his last study.  However, he is very knowledgeable  concerning his symptoms, and he is not having any.  With this in mind,  he is cleared for his surgical procedure.  I will see him back in 1 year for cardiology followup.  Patient is  cleared for his circumcision.  THE PATIENT SHOULD BE KEPT ON HIS  METOPROLOL, INCLUDING THE DAY OF SURGERY.     Luis Abed, MD, [facility]  Electronically Signed    JDK/MedQ  DD: 04/28/2006  DT: 04/28/2006  Job #: [id number]   cc:   Excell Seltzer. Annabell Howells, M.D.

## 2010-08-14 NOTE — Op Note (Signed)
NAME:  [name] [name], [name] [name] NO.:  [id number]   MEDICAL RECORD NO.:  [id number]          PATIENT TYPE:  AMB   LOCATION:  NESC                         FACILITY:  [facility]   PHYSICIAN:  Excell Seltzer. Annabell Howells, M.D.    DATE OF BIRTH:  09-13-1918   DATE OF PROCEDURE:  05/17/2006  DATE OF DISCHARGE:                               OPERATIVE REPORT   PROCEDURE:  Circumcision.   PREOPERATIVE DIAGNOSIS:  Phimosis.   POSTOPERATIVE DIAGNOSIS:  Phimosis.   SURGEON:  Dr. Bjorn Pippin.   ANESTHESIA:  MAC and local.   COMPLICATIONS:  None.   INDICATIONS:  Mr. Till is an 77 year old white male with severe  phimosis.  He is to undergo circumcision.   FINDINGS/PROCEDURE:  The patient is taken to the operating room where he  is placed on the table in supine position.  Sedation was induced.  His  genitalia was prepped with Betadine solution, he was draped in the usual  sterile fashion.  His penis was blocked with 13 mL of local anesthetic.  There was a 50/50 mix of 1% lidocaine and 0.25% Marcaine.  He then  underwent dorsal slit followed by sleeve resection of redundant  foreskin.  Once hemostasis was achieved with the Bovie, the skin edges  were reapproximated with a running 4-0 chromic interrupted in the  quadrants.  The penis was cleansed, a dressing of Xeroform gauze and  Coban was applied.  The patient was then moved to the recovery room in  stable condition and there were no complications.      Excell Seltzer. Annabell Howells, M.D.  Electronically Signed     JJW/MEDQ  D:  05/17/2006  T:  05/17/2006  Job:  [id number]   cc:   Ernestina Penna, M.D.  Fax: [phone]

## 2010-08-14 NOTE — Discharge Summary (Signed)
NAME:  [name] [name], [name] [name] NO.:  [id number]   MEDICAL RECORD NO.:  [id number]          PATIENT TYPE:  INP   LOCATION:  3705                         FACILITY:  [facility]   PHYSICIAN:  [name] [name], M.D.DATE OF BIRTH:  [birth year]   DATE OF ADMISSION:  06/30/2006  DATE OF DISCHARGE:  07/14/2006                               DISCHARGE SUMMARY   PRIMARY CARE PHYSICIAN:  Dr. Vernon Prey in [facility].   CARDIOLOGIST:  Dr. Luis Abed.   DISCHARGE DIAGNOSES:  1. Vent dependent respiratory failure - extubated/resolved.  2. Community acquired pneumonia - resolved.  3. Diabetes mellitus.  4. Renal insufficiency.  5. Hypertension.  6. Pulmonary edema - resolved.  7. Hypokalemia - replaced.  8. Hyponatremia - improving.  9. Severe deconditioned state - skilled nursing facility placement for      long term rehab.  10.Coronary artery disease status post coronary artery bypass graft      1998.  11.Hypertension.  12.Dyslipidemia.  13.Status post torn medial meniscus left knee with arthroscopic      partial meniscectomy 09/2001.  14.No known drug allergies.   DISCHARGE MEDICATIONS:  1. Lipitor 40 milligram, p.o. q. h.s.  2. Lantus 10 units, sub cu. q. 12 hours.  3. Protonix 40 milligram, q. day.  4. Lopressor 50 milligram, p.o. b.i.d.  5. Lasix 40 milligram, p.o. b.i.d.  6. Multivitamin, 1 p.o. q. day.  7. Enteric coated aspirin 325 milligram, p.o. q. day.   FOLLOWUP:  Ongoing care will be provided by the attending of record in  the patient's nursing home of choice.   CONSULTATIONS:  Critical care pulmonary/critical care medicine.   HOSPITAL COURSE:  Please note this is an interim discharge summary.  The  initial portion of the hospital stay was primarily by her pulmonary  critical care medicine.  For details concerning this portion of the  hospital stay, please refer to their interim discharge summary.   After being admitted to the  hospital for acute respiratory distress and  severe community acquired Streptococcal pneumonia, the patient was able  to be liberated from a vent and was transferred back to the hospital  service on 07/09/2006.  No further difficulty was encountered concerning  his respiratory failure.  His pneumonia had resolved completely  clinically and antibiotic therapy was able to be discontinued.  Mild  renal insufficiency had stabilized with creatinine around 1.2 on  average.  CBG remained stable with [name] sugars in the 140 to 150 range.  Other medical issues remained stable throughout the second portion of  his hospital stay.   Physical therapy and occupational therapy were consulted to evaluate the  patient.  The patient was found to be severely deconditioned and in a  severely weakened state.  Decision was made that he would not be safe  for discharge directly home.  Attempts were made to place the patient in  inpatient rehab, but however, beds were not available.  Skilled nursing  facility search was then carried out and a facility was located with  which the family was agreeable.  Patient was therefore transferred to  this facility for ongoing strengthening with ultimate goal to be  discharged home.      [name] [name], M.D.  Electronically Signed     JTM/MEDQ  D:  07/13/2006  T:  07/13/2006  Job:  [id number]   cc:   Ernestina Penna, M.D.  Luis Abed, MD, [facility]

## 2010-08-14 NOTE — H&P (Signed)
[facility]  Patient:    [name], [name] Visit Number: [id number] MRN: [id number]          Service Type: Attending:  Illene Labrador. [name], M.D. Dictated by:   [name] [name], P.A. Adm. Date:  10/26/01                           History and Physical  DATE OF BIRTH:  1918/12/31  CHIEF COMPLAINT:  Left knee pain.  HISTORY OF PRESENT ILLNESS:  The patient is a pleasant 77 year old male who was seen in our office for left knee pain.  He had a history of an asymptomatic meniscus tear by history on the right.  He had left knee pain for some quite some time now.  He had undergone aspirations and at least one injection of steroids that had persisted with pain to his knee.  He had an MRI on Aug 16, 2001, which demonstrated medial compartment degenerative changes with radial tear of posterior horn of the medial meniscus.  He is also felt to have a small knee effusion and moderate-sized Bakers cyst.  On physical exam in the office it was noted that he walked with a slight left leg limp.  He had tenderness over the medial joint line posteriorly, increased by the McMurrays maneuver; otherwise, no joint line tenderness, no ligament instability was noted.  Radiographs done in the office revealed slight medial compartment narrowing of the left knee with about 3 mm of joint space versus 6 mm laterally.  It was felt he would benefit from undergoing a left knee arthroscopy.  The risks and benefits as well as the procedure were discussed with the patient, who elected to proceed.  ALLERGIES:  No known drug allergies.  MEDICATIONS: 1. Multivitamin 1 p.o. q.d. 2. Ecotrin 325 1 p.o. q.d. 3. Metoprolol 25 mg 1 p.o. b.i.d. 4. Norvasc 7.5 mg 1 p.o. q.d.  PAST MEDICAL HISTORY:  History of a myocardial infarction with coronary artery bypass graft x 5 in 1998.  His cardiologist is Dr. Myrtis [name].  He sees him about [name] six months.  Reports no recent chest pain.  SOCIAL HISTORY:  The  patient is married.  He denies any alcohol or tobacco use.  He is a retired Scientist, water quality.  He has three children.  His wife will be available to care for him after his surgery.  He lives in a one-level home.  PAST SURGICAL HISTORY:  In 1998, coronary artery bypass graft x 5.  FAMILY HISTORY:  Mother deceased, age 26.  Father deceased, age 43.  Both were in relatively good health.  REVIEW OF SYSTEMS:  GENERAL:  No fevers, chills, night sweats, or bleeding tendencies.  PULMONARY:  No shortness of breath, productive cough, or hemoptysis.  CARDIOVASCULAR:  No recent episodes of chest pain or palpitations.  GENITOURINARY:  No hematuria, dysuria, or discharge. GASTROINTESTINAL:  No nausea, vomiting, diarrhea, or constipation. MUSCULOSKELETAL:  Left knee pain as described up above.  PSYCHIATRIC:  No history of anxiety or depression.  ENDOCRINE: No history of hypo- or hyperthyroidism.  No history of diabetes mellitus.  PHYSICAL EXAMINATION:  GENERAL:  Alert and oriented, well-developed, well-nourished 77 year old male who appears younger than his stated age.  VITAL SIGNS:  Pulse 60, respirations 24, blood pressure 140/80.  HEENT:  Head atraumatic, normocephalic.  Oropharynx is clear.  NECK:  Supple.  Negative for cervical lymphadenopathy.  LUNGS:  Clear to auscultation bilaterally.  No wheezes, rhonchi,  or rales.  BREASTS:  Not pertinent to present illness.  HEART:  S1, S2.  Negative for murmur, rub, or gallop.  ABDOMEN:  Soft and nontender.  Positive bowel sounds.  GENITOURINARY:  Not pertinent to present illness.  EXTREMITIES:  Please see history of present illness for physical exam to the left knee.  SKIN:  Intact.  No rashes or lesions appreciated on exam.  He does have a scar to the left medial lower leg with some slight edema to his left ankle.  LABORATORY DATA:  Preoperative laboratories and x-rays are pending.  IMPRESSION: 1. Torn medial meniscus left knee. 2. History  of myocardial infarction, status post coronary artery bypass graft    in 1998.  PLAN:  The patient is scheduled for a left knee arthroscopy, partial meniscectomy.  He has contacted Dr. Cheron [name] office and is currently waiting for a letter of clearance. Dictated by:   [name] [name], P.A. Attending:  Illene Labrador. [name], M.D. DD:  10/17/01 TD:  10/20/01 Job: [id number]

## 2010-12-17 LAB — BASIC METABOLIC PANEL
Chloride: 104
Creatinine, Ser: 1.07
Potassium: 5

## 2010-12-21 LAB — BASIC METABOLIC PANEL
CO2: 26
Calcium: 9.3
GFR calc Af Amer: >60
Potassium: 4.4
Sodium: 138

## 2011-04-19 DIAGNOSIS — Z23 Encounter for immunization: Secondary | ICD-10-CM | POA: Diagnosis not present

## 2011-04-22 DIAGNOSIS — IMO0001 Reserved for inherently not codable concepts without codable children: Secondary | ICD-10-CM | POA: Diagnosis not present

## 2011-04-22 DIAGNOSIS — E559 Vitamin D deficiency, unspecified: Secondary | ICD-10-CM | POA: Diagnosis not present

## 2011-04-22 DIAGNOSIS — E785 Hyperlipidemia, unspecified: Secondary | ICD-10-CM | POA: Diagnosis not present

## 2011-04-22 DIAGNOSIS — I10 Essential (primary) hypertension: Secondary | ICD-10-CM | POA: Diagnosis not present

## 2011-06-02 ENCOUNTER — Encounter: Payer: Self-pay | Admitting: *Deleted

## 2011-06-02 DIAGNOSIS — R0602 Shortness of breath: Secondary | ICD-10-CM | POA: Insufficient documentation

## 2011-06-02 DIAGNOSIS — E1140 Type 2 diabetes mellitus with diabetic neuropathy, unspecified: Secondary | ICD-10-CM | POA: Insufficient documentation

## 2011-06-02 DIAGNOSIS — I10 Essential (primary) hypertension: Secondary | ICD-10-CM | POA: Insufficient documentation

## 2011-06-02 DIAGNOSIS — E785 Hyperlipidemia, unspecified: Secondary | ICD-10-CM | POA: Insufficient documentation

## 2011-06-02 DIAGNOSIS — I2510 Atherosclerotic heart disease of native coronary artery without angina pectoris: Secondary | ICD-10-CM | POA: Insufficient documentation

## 2011-06-02 DIAGNOSIS — I739 Peripheral vascular disease, unspecified: Secondary | ICD-10-CM

## 2011-06-02 DIAGNOSIS — Z951 Presence of aortocoronary bypass graft: Secondary | ICD-10-CM | POA: Insufficient documentation

## 2011-06-02 DIAGNOSIS — R9430 Abnormal result of cardiovascular function study, unspecified: Secondary | ICD-10-CM | POA: Insufficient documentation

## 2011-06-03 ENCOUNTER — Encounter: Payer: Self-pay | Admitting: Cardiology

## 2011-06-03 ENCOUNTER — Ambulatory Visit (INDEPENDENT_AMBULATORY_CARE_PROVIDER_SITE_OTHER): Payer: Medicare Other | Admitting: Cardiology

## 2011-06-03 DIAGNOSIS — I10 Essential (primary) hypertension: Secondary | ICD-10-CM | POA: Diagnosis not present

## 2011-06-03 DIAGNOSIS — R0602 Shortness of breath: Secondary | ICD-10-CM | POA: Diagnosis not present

## 2011-06-03 DIAGNOSIS — I2510 Atherosclerotic heart disease of native coronary artery without angina pectoris: Secondary | ICD-10-CM

## 2011-06-03 DIAGNOSIS — I779 Disorder of arteries and arterioles, unspecified: Secondary | ICD-10-CM | POA: Diagnosis not present

## 2011-06-03 NOTE — Assessment & Plan Note (Signed)
Patient is not having any significant shortness of breath. No further workup needed.

## 2011-06-03 NOTE — Progress Notes (Signed)
HPI Patient is seen today for cardiology followup area I saw him last in January, 2012. As part of today's evaluation I have carefully reviewed the complete old cardiac records. I have completely updated the patient's new electrolytes medical record.  The patient is feeling well. He's not having any chest pain or shortness of breath. He's had no syncope or presyncope. He is 78 years old and he looks 82.  No Known Allergies  Current Outpatient Prescriptions  Medication Sig Dispense Refill  . aspirin 325 MG tablet Take 325 mg by mouth daily.      Marland Kitchen etodolac (LODINE) 200 MG capsule Take 200 mg by mouth every 8 (eight) hours.      Marland Kitchen glimepiride (AMARYL) 2 MG tablet Take 2 mg by mouth daily before breakfast.      . lovastatin (MEVACOR) 20 MG tablet Take 20 mg by mouth at bedtime.      . metoprolol succinate (TOPROL-XL) 50 MG 24 hr tablet Take 25 mg by mouth daily.         History   Social History  . Marital Status: Married    Spouse Name: N/A    Number of Children: N/A  . Years of Education: N/A   Occupational History  . bricklayer    Social History Main Topics  . Smoking status: Never Smoker   . Smokeless tobacco: Not on file  . Alcohol Use: No  . Drug Use: No  . Sexually Active: Not on file   Other Topics Concern  . Not on file   Social History Narrative  . No narrative on file    Family History  Problem Relation Age of Onset  . Heart disease Father     mother  . Heart failure Mother 8    deceased  . Heart attack Father 71    deceased    Past Medical History  Diagnosis Date  . HTN (hypertension)   . Hyperlipidemia   . CAD (coronary artery disease) 2002    Nuclear, January, 2011, no ischemia  . Diabetic neuropathy     Possible  . Respiratory failure     vent dependent...acquired pneumonia...2008  . Ejection fraction     EF 50-55%, echo, January, 2011, hypokinesis mid--base inferolateral  . Hx of CABG     1998  . Carotid artery disease     Doppler,  February, 2012, 0-39% bilateral  . Shortness of breath     2011    Past Surgical History  Procedure Date  . Coronary artery bypass graft   . Knee arthroplasty     total    ROS Patient denies fever, chills, headache, sweats, rash, change in vision, change in hearing, chest pain, cough, nausea vomiting, urinary symptoms. All other systems are reviewed and are negative.  PHYSICAL EXAM Patient looks great. He is oriented to person time and place. Affect is normal. Head is atraumatic. There is no jugulovenous distention. Lungs are clear.Respiratory effort is nonlabored. Cardiac exam reveals S1 and S2. There no clicks or significant murmurs. The abdomen is soft. Is no peripheral edema. There are no musculoskeletal deformities. There are no skin rashes. Filed Vitals:   06/03/11 1027  BP: 154/64  Pulse: 70  Height: 5\' 8"  (1.727 m)  Weight: 206 lb (93.441 kg)    ASSESSMENT & PLAN

## 2011-06-03 NOTE — Patient Instructions (Signed)
Your physician wants you to follow-up in: 1 year. You will receive a reminder letter in the mail two months in advance. If you don't receive a letter, please call our office to schedule the follow-up appointment.  

## 2011-06-03 NOTE — Assessment & Plan Note (Signed)
Coronary disease is stable. No further workup needed. 

## 2011-06-03 NOTE — Assessment & Plan Note (Signed)
Blood pressure is adequately controlled for this gentleman. It would not be prudent to change his meds.

## 2011-06-03 NOTE — Assessment & Plan Note (Signed)
Doppler was done February, 2012. He had mild minimal disease. He does not need a followup Doppler at this time.

## 2011-07-23 DIAGNOSIS — I10 Essential (primary) hypertension: Secondary | ICD-10-CM | POA: Diagnosis not present

## 2011-07-23 DIAGNOSIS — E785 Hyperlipidemia, unspecified: Secondary | ICD-10-CM | POA: Diagnosis not present

## 2011-07-23 DIAGNOSIS — E119 Type 2 diabetes mellitus without complications: Secondary | ICD-10-CM | POA: Diagnosis not present

## 2011-07-23 DIAGNOSIS — Z0000 Encounter for general adult medical examination without abnormal findings: Secondary | ICD-10-CM | POA: Diagnosis not present

## 2011-07-29 DIAGNOSIS — E785 Hyperlipidemia, unspecified: Secondary | ICD-10-CM | POA: Diagnosis not present

## 2011-07-29 DIAGNOSIS — I10 Essential (primary) hypertension: Secondary | ICD-10-CM | POA: Diagnosis not present

## 2011-08-09 DIAGNOSIS — E119 Type 2 diabetes mellitus without complications: Secondary | ICD-10-CM | POA: Diagnosis not present

## 2011-08-09 DIAGNOSIS — H5210 Myopia, unspecified eye: Secondary | ICD-10-CM | POA: Diagnosis not present

## 2011-08-09 DIAGNOSIS — H52229 Regular astigmatism, unspecified eye: Secondary | ICD-10-CM | POA: Diagnosis not present

## 2011-08-09 DIAGNOSIS — H2589 Other age-related cataract: Secondary | ICD-10-CM | POA: Diagnosis not present

## 2011-11-11 DIAGNOSIS — L851 Acquired keratosis [keratoderma] palmaris et plantaris: Secondary | ICD-10-CM | POA: Diagnosis not present

## 2011-11-11 DIAGNOSIS — B353 Tinea pedis: Secondary | ICD-10-CM | POA: Diagnosis not present

## 2011-11-11 DIAGNOSIS — B351 Tinea unguium: Secondary | ICD-10-CM | POA: Diagnosis not present

## 2011-11-11 DIAGNOSIS — I70209 Unspecified atherosclerosis of native arteries of extremities, unspecified extremity: Secondary | ICD-10-CM | POA: Diagnosis not present

## 2012-06-02 ENCOUNTER — Ambulatory Visit: Payer: PRIVATE HEALTH INSURANCE | Admitting: Cardiology

## 2012-06-29 ENCOUNTER — Ambulatory Visit (INDEPENDENT_AMBULATORY_CARE_PROVIDER_SITE_OTHER): Payer: Medicare Other | Admitting: Cardiology

## 2012-06-29 ENCOUNTER — Encounter: Payer: Self-pay | Admitting: Cardiology

## 2012-06-29 VITALS — BP 175/79 | HR 53 | Ht 69.0 in | Wt 208.4 lb

## 2012-06-29 DIAGNOSIS — I2510 Atherosclerotic heart disease of native coronary artery without angina pectoris: Secondary | ICD-10-CM

## 2012-06-29 DIAGNOSIS — I10 Essential (primary) hypertension: Secondary | ICD-10-CM | POA: Diagnosis not present

## 2012-06-29 DIAGNOSIS — I779 Disorder of arteries and arterioles, unspecified: Secondary | ICD-10-CM

## 2012-06-29 MED ORDER — ETODOLAC 400 MG PO TABS: 400.0000 mg | ORAL_TABLET | Freq: Every day | ORAL | Status: DC

## 2012-06-29 NOTE — Progress Notes (Signed)
HPI  Patient is seen for cardiology followup. He is 79 years old and looks great. He tells me that he finally retired. 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 He is fully active. He had undergone CABG many years ago. He's having no significant symptoms.  No Known Allergies  Current Outpatient Prescriptions  Medication Sig Dispense Refill  . aspirin 81 MG tablet Take 81 mg by mouth daily.      Marland Kitchen etodolac (LODINE) 400 MG tablet Take 400 mg by mouth daily.      Marland Kitchen glimepiride (AMARYL) 2 MG tablet Take 2 mg by mouth daily before breakfast.      . lovastatin (MEVACOR) 20 MG tablet Take 20 mg by mouth at bedtime.      . metoprolol succinate (TOPROL-XL) 50 MG 24 hr tablet Take 50 mg by mouth daily.       . Multiple Vitamin (MULTIVITAMIN) tablet Take 1 tablet by mouth daily.      Marland Kitchen OMEPRAZOLE PO Take by mouth daily.       No current facility-administered medications for this visit.    History   Social History  . Marital Status: Married    Spouse Name: N/A    Number of Children: N/A  . Years of Education: N/A   Occupational History  . bricklayer    Social History Main Topics  . Smoking status: Never Smoker   . Smokeless tobacco: Not on file  . Alcohol Use: No  . Drug Use: No  . Sexually Active: Not on file   Other Topics Concern  . Not on file   Social History Narrative  . No narrative on file    Family History  Problem Relation Age of Onset  . Heart disease Father     mother  . Heart failure Mother 101    deceased  . Heart attack Father 56    deceased    Past Medical History  Diagnosis Date  . HTN (hypertension)   . Hyperlipidemia   . CAD (coronary artery disease) 2002    Nuclear, January, 2011, no ischemia  . Diabetic neuropathy     Possible  . Respiratory failure     vent dependent...acquired pneumonia...2008  . Ejection fraction     EF 50-55%, echo, January, 2011, hypokinesis mid--base inferolateral  . Hx of CABG     1998  . Carotid artery disease      Doppler, February, 2012, 0-39% bilateral  . Shortness of breath     2011    Past Surgical History  Procedure Laterality Date  . Coronary artery bypass graft    . Knee arthroplasty      total    Patient Active Problem List  Diagnosis  . DM  . HTN (hypertension)  . Hyperlipidemia  . CAD (coronary artery disease)  . Diabetic neuropathy  . Hx of CABG  . Carotid artery disease  . Ejection fraction  . Shortness of breath    ROS   Patient denies fever, chills, headache, sweats, rash, change in vision, change in hearing, chest pain, cough, nausea vomiting, urinary symptoms. All other systems are reviewed and are negative.  PHYSICAL EXAM  Patient is here with his wife and his daughter. He is oriented to person time and place. Affect is normal. He looks quite good for age 79. There is no jugulovenous distention. Lungs are clear. Respiratory effort is nonlabored. Cardiac exam reveals S1 and S2. There no clicks or significant murmurs. The  abdomen is soft. Is no peripheral edema.  Filed Vitals:   06/29/12 1533  BP: 175/79  Pulse: 53  Height: 5\' 9"  (1.753 m)  Weight: 208 lb 6.4 oz (94.53 kg)    EKG is done today. There is mild sinus bradycardia. There are nonspecific ST-T wave changes. There is no significant change.  ASSESSMENT & PLAN

## 2012-06-29 NOTE — Assessment & Plan Note (Signed)
Coronary disease is stable. He had a nuclear scan in 2011 with no ischemia.No further workup at this time.

## 2012-06-29 NOTE — Patient Instructions (Addendum)
Your physician wants you to follow-up in: 1 year.   You will receive a reminder letter in the mail two months in advance. If you don't receive a letter, please call our office to schedule the follow-up appointment.  Check your blood pressure periodically and call us with the readings at [phone].

## 2012-06-29 NOTE — Assessment & Plan Note (Signed)
He has minimal carotid disease. He does not need a Doppler now.

## 2012-06-29 NOTE — Assessment & Plan Note (Signed)
Blood pressure is higher today than usual. His daughter we'll measure his pressure at home and call them to [place]. He may need other medications. He is on meds for his arthritis. I am hopeful that this is not playing a significant role with his renal function or hypertension.

## 2012-07-03 ENCOUNTER — Other Ambulatory Visit: Payer: Self-pay

## 2012-07-03 ENCOUNTER — Telehealth: Payer: Self-pay | Admitting: Cardiology

## 2012-07-03 MED ORDER — ETODOLAC 400 MG PO TABS: 400.0000 mg | ORAL_TABLET | Freq: Every day | ORAL | Status: DC

## 2012-07-03 NOTE — Telephone Encounter (Signed)
Refill ok per Dr Myrtis Ser this time with 3 additional refills.  After that PCP needs to be contacted for refills.

## 2012-07-03 NOTE — Telephone Encounter (Signed)
New Prob    Pt has some questions regarding medication. Would like to speak to a nurse.

## 2012-07-03 NOTE — Telephone Encounter (Signed)
Requesting refill of etodolac be sent to [facility] in [place] instead of [facility] in [place].  Refill resent.

## 2012-09-01 ENCOUNTER — Telehealth: Payer: Self-pay | Admitting: Cardiology

## 2012-09-01 MED ORDER — METOPROLOL SUCCINATE ER 50 MG PO TB24: 50.0000 mg | ORAL_TABLET | Freq: Two times a day (BID) | ORAL | Status: DC

## 2012-09-01 NOTE — Telephone Encounter (Signed)
New problem    pts daughter calling to give update on pts b/p-pt also needs refill on b/p medication

## 2012-09-01 NOTE — Telephone Encounter (Signed)
Pts daughter, Aurther Loft, called to report pts BP readings as Dr Argentina Ponder requested at pts last OV. She states that she is out of town a lot and recorded BP's that she could while in town. 07/17/12  BP= 162/59 HR= 50 07/19/12  BP= 154/70 HR= 57 07/24/12  BP= 127/64 HR= 53  I asked Aurther Loft if she could continue to monitor pts BP's and call us with results next Friday, she verbalized understanding and agrees with plan.  Also, she states that the pt has been taking Toprol 50 mg bid for a long time and not daily as we have in chart, corrected in chart and [facility].

## 2012-09-11 ENCOUNTER — Telehealth: Payer: Self-pay | Admitting: Cardiology

## 2012-09-11 NOTE — Telephone Encounter (Signed)
Follow up  Pt daughter is following the advise of Larita Fife from last TE regarding giving an updated BP readings  6.7.14 left arm 130/102 hr 66, rt arm 151/65 hr 62 6.15.14 lt arm 116/62 hr 64, rt arm 122/62 hr 62

## 2012-09-11 NOTE — Telephone Encounter (Signed)
Returned call to patient's daughter Camelia Eng she stated Dr.Katz's nurse Larita Fife wanted her to call back with patient's B/P readings.Stated patient was doing good.Message sent to [facility] Via LPN.

## 2012-10-09 DIAGNOSIS — H524 Presbyopia: Secondary | ICD-10-CM | POA: Diagnosis not present

## 2012-10-09 DIAGNOSIS — E119 Type 2 diabetes mellitus without complications: Secondary | ICD-10-CM | POA: Diagnosis not present

## 2012-10-09 DIAGNOSIS — H2589 Other age-related cataract: Secondary | ICD-10-CM | POA: Diagnosis not present

## 2012-10-30 ENCOUNTER — Other Ambulatory Visit: Payer: Self-pay | Admitting: Cardiology

## 2012-11-03 ENCOUNTER — Other Ambulatory Visit: Payer: Self-pay | Admitting: Cardiology

## 2012-11-25 ENCOUNTER — Other Ambulatory Visit: Payer: Self-pay | Admitting: Nurse Practitioner

## 2012-11-28 NOTE — Telephone Encounter (Signed)
Patient needs to be seen. Has exceeded time since last visit. Needs to bring all medications to next appointment. 2 weeks Rx approved.

## 2012-11-28 NOTE — Telephone Encounter (Signed)
Last seen 07/23/11 DFS  Last glucose 07/24/11

## 2013-01-01 ENCOUNTER — Ambulatory Visit (INDEPENDENT_AMBULATORY_CARE_PROVIDER_SITE_OTHER): Payer: Medicare Other | Admitting: Family Medicine

## 2013-01-01 ENCOUNTER — Encounter: Payer: Self-pay | Admitting: Family Medicine

## 2013-01-01 VITALS — BP 159/69 | HR 60 | Temp 96.3°F | Ht 66.25 in | Wt 208.0 lb

## 2013-01-01 DIAGNOSIS — E119 Type 2 diabetes mellitus without complications: Secondary | ICD-10-CM

## 2013-01-01 DIAGNOSIS — E785 Hyperlipidemia, unspecified: Secondary | ICD-10-CM

## 2013-01-01 DIAGNOSIS — L989 Disorder of the skin and subcutaneous tissue, unspecified: Secondary | ICD-10-CM

## 2013-01-01 DIAGNOSIS — M1990 Unspecified osteoarthritis, unspecified site: Secondary | ICD-10-CM

## 2013-01-01 DIAGNOSIS — M129 Arthropathy, unspecified: Secondary | ICD-10-CM

## 2013-01-01 LAB — POCT GLYCOSYLATED HEMOGLOBIN (HGB A1C): Hemoglobin A1C: 6.6

## 2013-01-01 MED ORDER — LOVASTATIN 20 MG PO TABS: 20.0000 mg | ORAL_TABLET | Freq: Every day | ORAL | Status: DC

## 2013-01-01 MED ORDER — ETODOLAC 200 MG PO CAPS: 200.0000 mg | ORAL_CAPSULE | Freq: Three times a day (TID) | ORAL | Status: DC

## 2013-01-01 MED ORDER — GLIMEPIRIDE 2 MG PO TABS: 2.0000 mg | ORAL_TABLET | Freq: Every day | ORAL | Status: DC

## 2013-01-01 NOTE — Progress Notes (Signed)
  Subjective:    Patient ID: [name] [name], male    DOB: 10-15-1918, 79 y.o.   MRN: [id number]  HPI This 79 y.o. male presents for evaluation of diabetes, hypertension, and hyperlipidemia. He saw Dr. Myrtis Ser Cardiologist for s/p CABG.  He is doing fine.  He needs refills on diabetes medicine. He uses lodine arthritis medicine on occasion.   Review of Systems No chest pain, SOB, HA, dizziness, vision change, N/V, diarrhea, constipation, dysuria, urinary urgency or frequency, myalgias, arthralgias or rash.     Objective:   Physical Exam Vital signs noted  Well developed well nourished male.  HEENT - Head atraumatic Normocephalic                Eyes - PERRLA, Conjuctiva - clear Sclera- Clear EOMI                Ears - EAC's Wnl TM's Wnl Gross Hearing WNL                Nose - Nares patent                 Throat - oropharanx wnl Respiratory - Lungs CTA bilateral Cardiac - RRR S1 and S2 without murmur GI - Abdomen soft Nontender and bowel sounds active x 4 Extremities - No edema. Neuro - Grossly intact.   Results for orders placed in visit on 01/01/13  POCT GLYCOSYLATED HEMOGLOBIN (HGB A1C)      Result Value Range   Hemoglobin A1C 6.6        Assessment & Plan:  Diabetes - Plan: POCT CBC, POCT glycosylated hemoglobin (Hb A1C), CMP14+EGFR, etodolac (LODINE) 200 MG capsule  Arthritis - Plan: Lodine 200mg  po bid prn  Other and unspecified hyperlipidemia - Plan: Lipid panel, lovastatin (MEVACOR) 20 MG tablet  Skin lesion - Refer to Dermatology.  Follow up in 6 months  Deatra Canter FNP

## 2013-01-01 NOTE — Patient Instructions (Addendum)

## 2013-01-02 LAB — LIPID PANEL
Chol/HDL Ratio: 3.9 ratio units (ref 0.0–5.0)
Cholesterol, Total: 130 mg/dL (ref 100–199)
HDL: 33 mg/dL — ABNORMAL LOW (ref >39)
LDL Calculated: 35 mg/dL (ref 0–99)
Triglycerides: 309 mg/dL — ABNORMAL HIGH (ref 0–149)
VLDL Cholesterol Cal: 62 mg/dL — ABNORMAL HIGH (ref 5–40)

## 2013-01-02 LAB — CMP14+EGFR
ALT: 49 IU/L — ABNORMAL HIGH (ref 0–44)
AST: 30 IU/L (ref 0–40)
Albumin/Globulin Ratio: 1.7 (ref 1.1–2.5)
Albumin: 4.1 g/dL (ref 3.2–4.6)
Alkaline Phosphatase: 61 IU/L (ref 39–117)
BUN/Creatinine Ratio: 20 (ref 10–22)
BUN: 21 mg/dL (ref 10–36)
CO2: 25 mmol/L (ref 18–29)
Calcium: 9.8 mg/dL (ref 8.6–10.2)
Chloride: 99 mmol/L (ref 97–108)
Creatinine, Ser: 1.06 mg/dL (ref 0.76–1.27)
GFR calc Af Amer: 70 mL/min/{1.73_m2} (ref >59)
GFR calc non Af Amer: 61 mL/min/{1.73_m2} (ref >59)
Globulin, Total: 2.4 g/dL (ref 1.5–4.5)
Glucose: 191 mg/dL — ABNORMAL HIGH (ref 65–99)
Potassium: 4.7 mmol/L (ref 3.5–5.2)
Sodium: 138 mmol/L (ref 134–144)
Total Bilirubin: 0.6 mg/dL (ref 0.0–1.2)
Total Protein: 6.5 g/dL (ref 6.0–8.5)

## 2013-01-02 LAB — CBC WITH DIFFERENTIAL
Basophils Absolute: 0.1 10*3/uL (ref 0.0–0.2)
Basos: 1 %
Eos: 3 %
Eosinophils Absolute: 0.2 10*3/uL (ref 0.0–0.4)
HCT: 42 % (ref 37.5–51.0)
Hemoglobin: 14.5 g/dL (ref 12.6–17.7)
Immature Grans (Abs): 0 10*3/uL (ref 0.0–0.1)
Immature Granulocytes: 0 %
Lymphocytes Absolute: 1.9 10*3/uL (ref 0.7–3.1)
Lymphs: 28 %
MCH: 33.4 pg — ABNORMAL HIGH (ref 26.6–33.0)
MCHC: 34.5 g/dL (ref 31.5–35.7)
MCV: 97 fL (ref 79–97)
Monocytes Absolute: 0.7 10*3/uL (ref 0.1–0.9)
Monocytes: 11 %
Neutrophils Absolute: 3.8 10*3/uL (ref 1.4–7.0)
Neutrophils Relative %: 57 %
Platelets: 208 10*3/uL (ref 150–379)
RBC: 4.34 x10E6/uL (ref 4.14–5.80)
RDW: 13.9 % (ref 12.3–15.4)
WBC: 6.8 10*3/uL (ref 3.4–10.8)

## 2013-01-10 ENCOUNTER — Other Ambulatory Visit: Payer: Self-pay

## 2013-01-10 ENCOUNTER — Emergency Department (HOSPITAL_COMMUNITY): Payer: Medicare Other

## 2013-01-10 ENCOUNTER — Encounter (HOSPITAL_COMMUNITY): Payer: Medicare Other | Admitting: Anesthesiology

## 2013-01-10 ENCOUNTER — Observation Stay (HOSPITAL_COMMUNITY): Payer: Medicare Other | Admitting: Anesthesiology

## 2013-01-10 ENCOUNTER — Inpatient Hospital Stay (HOSPITAL_COMMUNITY)
Admission: EM | Admit: 2013-01-10 | Discharge: 2013-01-14 | DRG: 340 | Disposition: A | Discharge status: Home / Self Care | Payer: Medicare Other | Attending: General Surgery | Admitting: General Surgery

## 2013-01-10 ENCOUNTER — Ambulatory Visit (INDEPENDENT_AMBULATORY_CARE_PROVIDER_SITE_OTHER): Payer: Medicare Other | Admitting: Family Medicine

## 2013-01-10 ENCOUNTER — Encounter (HOSPITAL_COMMUNITY): Payer: Self-pay | Admitting: Emergency Medicine

## 2013-01-10 ENCOUNTER — Ambulatory Visit (HOSPITAL_COMMUNITY)
Admission: RE | Admit: 2013-01-10 | Discharge: 2013-01-10 | Disposition: A | Discharge status: Home / Self Care | Payer: Medicare Other | Source: Ambulatory Visit | Attending: Family Medicine | Admitting: Family Medicine

## 2013-01-10 ENCOUNTER — Encounter (HOSPITAL_COMMUNITY)
Admission: EM | Disposition: A | Discharge status: Home / Self Care | Payer: Self-pay | Source: Home / Self Care | Attending: General Surgery

## 2013-01-10 VITALS — BP 132/65 | HR 57 | Temp 97.1°F | Wt 200.0 lb

## 2013-01-10 DIAGNOSIS — K3580 Unspecified acute appendicitis: Secondary | ICD-10-CM

## 2013-01-10 DIAGNOSIS — R1031 Right lower quadrant pain: Secondary | ICD-10-CM | POA: Diagnosis not present

## 2013-01-10 DIAGNOSIS — Z8249 Family history of ischemic heart disease and other diseases of the circulatory system: Secondary | ICD-10-CM

## 2013-01-10 DIAGNOSIS — J40 Bronchitis, not specified as acute or chronic: Secondary | ICD-10-CM | POA: Diagnosis not present

## 2013-01-10 DIAGNOSIS — K35209 Acute appendicitis with generalized peritonitis, without abscess, unspecified as to perforation: Secondary | ICD-10-CM | POA: Diagnosis not present

## 2013-01-10 DIAGNOSIS — I10 Essential (primary) hypertension: Secondary | ICD-10-CM | POA: Diagnosis not present

## 2013-01-10 DIAGNOSIS — E1142 Type 2 diabetes mellitus with diabetic polyneuropathy: Secondary | ICD-10-CM | POA: Diagnosis present

## 2013-01-10 DIAGNOSIS — Z6829 Body mass index (BMI) 29.0-29.9, adult: Secondary | ICD-10-CM

## 2013-01-10 DIAGNOSIS — R109 Unspecified abdominal pain: Secondary | ICD-10-CM | POA: Diagnosis not present

## 2013-01-10 DIAGNOSIS — K352 Acute appendicitis with generalized peritonitis, without abscess: Secondary | ICD-10-CM | POA: Diagnosis not present

## 2013-01-10 DIAGNOSIS — I2510 Atherosclerotic heart disease of native coronary artery without angina pectoris: Secondary | ICD-10-CM | POA: Diagnosis not present

## 2013-01-10 DIAGNOSIS — K7689 Other specified diseases of liver: Secondary | ICD-10-CM | POA: Insufficient documentation

## 2013-01-10 DIAGNOSIS — E785 Hyperlipidemia, unspecified: Secondary | ICD-10-CM | POA: Diagnosis present

## 2013-01-10 DIAGNOSIS — E1149 Type 2 diabetes mellitus with other diabetic neurological complication: Secondary | ICD-10-CM | POA: Diagnosis present

## 2013-01-10 DIAGNOSIS — Z951 Presence of aortocoronary bypass graft: Secondary | ICD-10-CM

## 2013-01-10 DIAGNOSIS — R933 Abnormal findings on diagnostic imaging of other parts of digestive tract: Secondary | ICD-10-CM | POA: Insufficient documentation

## 2013-01-10 DIAGNOSIS — E669 Obesity, unspecified: Secondary | ICD-10-CM | POA: Diagnosis present

## 2013-01-10 DIAGNOSIS — R509 Fever, unspecified: Secondary | ICD-10-CM | POA: Insufficient documentation

## 2013-01-10 HISTORY — PX: LAPAROSCOPIC APPENDECTOMY: SHX408

## 2013-01-10 LAB — POCT UA - MICROSCOPIC ONLY
Crystals, Ur, HPF, POC: NEGATIVE
Yeast, UA: NEGATIVE

## 2013-01-10 LAB — GLUCOSE, CAPILLARY
Glucose-Capillary: 130 mg/dL — ABNORMAL HIGH (ref 70–99)
Glucose-Capillary: 160 mg/dL — ABNORMAL HIGH (ref 70–99)

## 2013-01-10 LAB — POCT CBC
Granulocyte percent: 85.8 %G — AB (ref 37–80)
HCT, POC: 42.6 % — AB (ref 43.5–53.7)
MCH, POC: 34 pg — AB (ref 27–31.2)
MPV: 8.3 fL (ref 0–99.8)
POC Granulocyte: 12.6 — AB (ref 2–6.9)
Platelet Count, POC: 205 10*3/uL (ref 142–424)
RDW, POC: 13.3 %
WBC: 14.7 10*3/uL — AB (ref 4.6–10.2)

## 2013-01-10 LAB — POCT URINALYSIS DIPSTICK
Ketones, UA: NEGATIVE
Leukocytes, UA: NEGATIVE
Nitrite, UA: NEGATIVE
Spec Grav, UA: 1.02

## 2013-01-10 LAB — BASIC METABOLIC PANEL
BUN: 17 mg/dL (ref 6–23)
Chloride: 95 mEq/L — ABNORMAL LOW (ref 96–112)
Creatinine, Ser: 1.18 mg/dL (ref 0.50–1.35)
GFR calc Af Amer: 60 mL/min — ABNORMAL LOW (ref >90)
GFR calc non Af Amer: 52 mL/min — ABNORMAL LOW (ref >90)
Glucose, Bld: 147 mg/dL — ABNORMAL HIGH (ref 70–99)
Sodium: 132 mEq/L — ABNORMAL LOW (ref 135–145)

## 2013-01-10 LAB — CBC
HCT: 40.1 % (ref 39.0–52.0)
Hemoglobin: 14.1 g/dL (ref 13.0–17.0)
MCV: 99 fL (ref 78.0–100.0)
RDW: 13.1 % (ref 11.5–15.5)
WBC: 14.4 10*3/uL — ABNORMAL HIGH (ref 4.0–10.5)

## 2013-01-10 LAB — PROTIME-INR
INR: 1.19 (ref 0.00–1.49)
Prothrombin Time: 14.8 seconds (ref 11.6–15.2)

## 2013-01-10 SURGERY — APPENDECTOMY, LAPAROSCOPIC
Anesthesia: General | Site: Abdomen | Wound class: Dirty or Infected

## 2013-01-10 MED ORDER — PANTOPRAZOLE SODIUM 40 MG IV SOLR
40.0000 mg | Freq: Every day | INTRAVENOUS | Status: DC
Start: 2013-01-10 — End: 2013-01-12
  Administered 2013-01-10 – 2013-01-11 (×2): 40 mg via INTRAVENOUS
  Filled 2013-01-10 (×2): qty 40

## 2013-01-10 MED ORDER — LIDOCAINE HCL (PF) 1 % IJ SOLN
INTRAMUSCULAR | Status: AC
  Filled 2013-01-10: qty 5

## 2013-01-10 MED ORDER — SIMVASTATIN 10 MG PO TABS
5.0000 mg | ORAL_TABLET | Freq: Every day | ORAL | Status: DC
  Administered 2013-01-11 – 2013-01-13 (×3): 5 mg via ORAL
  Filled 2013-01-10 (×3): qty 1

## 2013-01-10 MED ORDER — GLIMEPIRIDE 2 MG PO TABS
2.0000 mg | ORAL_TABLET | Freq: Every day | ORAL | Status: DC
  Administered 2013-01-11 – 2013-01-14 (×4): 2 mg via ORAL
  Filled 2013-01-10 (×4): qty 1

## 2013-01-10 MED ORDER — SUCCINYLCHOLINE CHLORIDE 20 MG/ML IJ SOLN
INTRAMUSCULAR | Status: AC
  Filled 2013-01-10: qty 1

## 2013-01-10 MED ORDER — SODIUM CHLORIDE 0.9 % IR SOLN
Status: DC | PRN
  Administered 2013-01-10: 3000 mL

## 2013-01-10 MED ORDER — SODIUM CHLORIDE 0.9 % IV SOLN
INTRAVENOUS | Status: DC | PRN
  Administered 2013-01-10: 20:00:00 via INTRAVENOUS

## 2013-01-10 MED ORDER — SODIUM CHLORIDE 0.9 % IV SOLN
1.0000 g | Freq: Once | INTRAVENOUS | Status: AC
  Administered 2013-01-10: 1 g via INTRAVENOUS
  Filled 2013-01-10: qty 1

## 2013-01-10 MED ORDER — ENOXAPARIN SODIUM 40 MG/0.4ML ~~LOC~~ SOLN
40.0000 mg | Freq: Once | SUBCUTANEOUS | Status: DC
  Filled 2013-01-10: qty 0.4

## 2013-01-10 MED ORDER — DEXAMETHASONE SODIUM PHOSPHATE 4 MG/ML IJ SOLN
INTRAMUSCULAR | Status: DC | PRN
  Administered 2013-01-10: 4 mg via INTRAVENOUS

## 2013-01-10 MED ORDER — SODIUM CHLORIDE 0.9 % IR SOLN
Status: DC | PRN
  Administered 2013-01-10: 1000 mL

## 2013-01-10 MED ORDER — LACTATED RINGERS IV SOLN
INTRAVENOUS | Status: DC
  Administered 2013-01-10 – 2013-01-14 (×7): via INTRAVENOUS

## 2013-01-10 MED ORDER — LACTATED RINGERS IV SOLN
INTRAVENOUS | Status: DC | PRN
  Administered 2013-01-10: 20:00:00 via INTRAVENOUS

## 2013-01-10 MED ORDER — FENTANYL CITRATE 0.05 MG/ML IJ SOLN
INTRAMUSCULAR | Status: AC
  Filled 2013-01-10: qty 5

## 2013-01-10 MED ORDER — NEOSTIGMINE METHYLSULFATE 1 MG/ML IJ SOLN
INTRAMUSCULAR | Status: DC | PRN
  Administered 2013-01-10 (×3): 1 mg via INTRAVENOUS

## 2013-01-10 MED ORDER — DEXAMETHASONE SODIUM PHOSPHATE 4 MG/ML IJ SOLN
INTRAMUSCULAR | Status: AC
  Filled 2013-01-10: qty 1

## 2013-01-10 MED ORDER — ONDANSETRON HCL 4 MG/2ML IJ SOLN
INTRAMUSCULAR | Status: DC | PRN
  Administered 2013-01-10: 4 mg via INTRAMUSCULAR

## 2013-01-10 MED ORDER — AMPICILLIN-SULBACTAM SODIUM 3 (2-1) G IJ SOLR
3.0000 g | Freq: Once | INTRAMUSCULAR | Status: DC
  Administered 2013-01-10: 3 g via INTRAVENOUS
  Filled 2013-01-10: qty 3

## 2013-01-10 MED ORDER — FENTANYL CITRATE 0.05 MG/ML IJ SOLN
INTRAMUSCULAR | Status: DC | PRN
  Administered 2013-01-10 (×2): 50 ug via INTRAVENOUS
  Administered 2013-01-10: 100 ug via INTRAVENOUS
  Administered 2013-01-10: 50 ug via INTRAVENOUS

## 2013-01-10 MED ORDER — ERTAPENEM SODIUM 1 G IJ SOLR
1.0000 g | INTRAMUSCULAR | Status: DC
  Administered 2013-01-11: 1 g via INTRAMUSCULAR
  Filled 2013-01-10: qty 1

## 2013-01-10 MED ORDER — SUCCINYLCHOLINE CHLORIDE 20 MG/ML IJ SOLN
INTRAMUSCULAR | Status: DC | PRN
  Administered 2013-01-10: 150 mg via INTRAVENOUS

## 2013-01-10 MED ORDER — MIDAZOLAM HCL 5 MG/5ML IJ SOLN
INTRAMUSCULAR | Status: DC | PRN
  Administered 2013-01-10: 2 mg via INTRAVENOUS

## 2013-01-10 MED ORDER — GLYCOPYRROLATE 0.2 MG/ML IJ SOLN
INTRAMUSCULAR | Status: AC
  Filled 2013-01-10: qty 2

## 2013-01-10 MED ORDER — MIDAZOLAM HCL 2 MG/2ML IJ SOLN
INTRAMUSCULAR | Status: AC
  Filled 2013-01-10: qty 2

## 2013-01-10 MED ORDER — ONDANSETRON HCL 4 MG/2ML IJ SOLN: 4.0000 mg | Freq: Four times a day (QID) | INTRAMUSCULAR | Status: DC | PRN

## 2013-01-10 MED ORDER — GLYCOPYRROLATE 0.2 MG/ML IJ SOLN
INTRAMUSCULAR | Status: AC
  Filled 2013-01-10: qty 1

## 2013-01-10 MED ORDER — ENOXAPARIN SODIUM 40 MG/0.4ML ~~LOC~~ SOLN
40.0000 mg | SUBCUTANEOUS | Status: DC
  Administered 2013-01-10 – 2013-01-13 (×4): 40 mg via SUBCUTANEOUS
  Filled 2013-01-10 (×3): qty 0.4

## 2013-01-10 MED ORDER — BUPIVACAINE HCL (PF) 0.5 % IJ SOLN
INTRAMUSCULAR | Status: AC
  Filled 2013-01-10: qty 30

## 2013-01-10 MED ORDER — GLYCOPYRROLATE 0.2 MG/ML IJ SOLN
INTRAMUSCULAR | Status: DC | PRN
  Administered 2013-01-10 (×2): 0.2 mg via INTRAVENOUS

## 2013-01-10 MED ORDER — ROCURONIUM BROMIDE 100 MG/10ML IV SOLN
INTRAVENOUS | Status: DC | PRN
  Administered 2013-01-10: 35 mg via INTRAVENOUS

## 2013-01-10 MED ORDER — ETOMIDATE 2 MG/ML IV SOLN
INTRAVENOUS | Status: DC | PRN
  Administered 2013-01-10: 12 mg via INTRAVENOUS

## 2013-01-10 MED ORDER — IOHEXOL 300 MG/ML  SOLN
100.0000 mL | Freq: Once | INTRAMUSCULAR | Status: AC | PRN
  Administered 2013-01-10: 100 mL via INTRAVENOUS

## 2013-01-10 MED ORDER — ETOMIDATE 2 MG/ML IV SOLN
INTRAVENOUS | Status: AC
  Filled 2013-01-10: qty 10

## 2013-01-10 MED ORDER — BUPIVACAINE HCL 0.5 % IJ SOLN
INTRAMUSCULAR | Status: DC | PRN
  Administered 2013-01-10: 10 mL

## 2013-01-10 MED ORDER — LIDOCAINE HCL 1 % IJ SOLN
INTRAMUSCULAR | Status: DC | PRN
  Administered 2013-01-10: 50 mg via INTRADERMAL

## 2013-01-10 MED ORDER — MORPHINE SULFATE 2 MG/ML IJ SOLN: 1.0000 mg | INTRAMUSCULAR | Status: DC | PRN

## 2013-01-10 MED ORDER — ONDANSETRON HCL 4 MG/2ML IJ SOLN
INTRAMUSCULAR | Status: AC
  Filled 2013-01-10: qty 2

## 2013-01-10 MED ORDER — METOPROLOL SUCCINATE ER 50 MG PO TB24
50.0000 mg | ORAL_TABLET | Freq: Two times a day (BID) | ORAL | Status: DC
  Administered 2013-01-10 – 2013-01-14 (×8): 50 mg via ORAL
  Filled 2013-01-10 (×8): qty 1

## 2013-01-10 SURGICAL SUPPLY — 58 items
APL SKNCLS STERI-STRIP NONHPOA (GAUZE/BANDAGES/DRESSINGS) ×1
BAG HAMPER (MISCELLANEOUS) ×2 IMPLANT
BAG SPEC RTRVL LRG 6X4 10 (ENDOMECHANICALS) ×1
BENZOIN TINCTURE PRP APPL 2/3 (GAUZE/BANDAGES/DRESSINGS) ×2 IMPLANT
CLOTH BEACON ORANGE TIMEOUT ST (SAFETY) ×2 IMPLANT
COVER LIGHT HANDLE STERIS (MISCELLANEOUS) ×4 IMPLANT
CUTTER FLEX LINEAR 45M (STAPLE) ×2 IMPLANT
CUTTER LINEAR ENDO 35 ETS (STAPLE) ×1 IMPLANT
DECANTER SPIKE VIAL GLASS SM (MISCELLANEOUS) ×2 IMPLANT
DEVICE TROCAR PUNCTURE CLOSURE (ENDOMECHANICALS) ×2 IMPLANT
DURAPREP 26ML APPLICATOR (WOUND CARE) ×2 IMPLANT
ELECT REM PT RETURN 9FT ADLT (ELECTROSURGICAL) ×2
ELECTRODE REM PT RTRN 9FT ADLT (ELECTROSURGICAL) ×1 IMPLANT
EVACUATOR DRAINAGE 10X20 100CC (DRAIN) IMPLANT
EVACUATOR SILICONE 100CC (DRAIN) ×2
FILTER SMOKE EVAC LAPAROSHD (FILTER) ×2 IMPLANT
FORMALIN 10 PREFIL 120ML (MISCELLANEOUS) ×2 IMPLANT
GLOVE BIOGEL PI IND STRL 7.0 (GLOVE) IMPLANT
GLOVE BIOGEL PI IND STRL 7.5 (GLOVE) ×1 IMPLANT
GLOVE BIOGEL PI INDICATOR 7.0 (GLOVE) ×1
GLOVE BIOGEL PI INDICATOR 7.5 (GLOVE) ×1
GLOVE ECLIPSE 6.5 STRL STRAW (GLOVE) ×1 IMPLANT
GLOVE ECLIPSE 7.0 STRL STRAW (GLOVE) ×2 IMPLANT
GLOVE EXAM NITRILE MD LF STRL (GLOVE) ×1 IMPLANT
GOWN STRL REIN XL XLG (GOWN DISPOSABLE) ×4 IMPLANT
INST SET LAPROSCOPIC AP (KITS) ×2 IMPLANT
IV NS IRRIG 3000ML ARTHROMATIC (IV SOLUTION) ×1 IMPLANT
KIT ROOM TURNOVER APOR (KITS) ×2 IMPLANT
MANIFOLD NEPTUNE II (INSTRUMENTS) ×2 IMPLANT
NDL INSUFFLATION 14GA 120MM (NEEDLE) ×1 IMPLANT
NEEDLE INSUFFLATION 14GA 120MM (NEEDLE) ×2 IMPLANT
NS IRRIG 1000ML POUR BTL (IV SOLUTION) ×2 IMPLANT
PACK LAP CHOLE LZT030E (CUSTOM PROCEDURE TRAY) ×2 IMPLANT
PAD ARMBOARD 7.5X6 YLW CONV (MISCELLANEOUS) ×2 IMPLANT
POUCH SPECIMEN RETRIEVAL 10MM (ENDOMECHANICALS) ×2 IMPLANT
RELOAD 45 VASCULAR/THIN (ENDOMECHANICALS) IMPLANT
RELOAD STAPLE 45 2.5 WHT GRN (ENDOMECHANICALS) IMPLANT
RELOAD STAPLE 45 3.5 BLU ETS (ENDOMECHANICALS) IMPLANT
RELOAD STAPLE TA45 3.5 REG BLU (ENDOMECHANICALS) IMPLANT
SEALER TISSUE G2 CVD JAW 35 (ENDOMECHANICALS) ×1 IMPLANT
SEALER TISSUE G2 CVD JAW 45CM (ENDOMECHANICALS) ×1
SET BASIN LINEN APH (SET/KITS/TRAYS/PACK) ×2 IMPLANT
SET TUBE IRRIG SUCTION NO TIP (IRRIGATION / IRRIGATOR) ×1 IMPLANT
SLEEVE ENDOPATH XCEL 5M (ENDOMECHANICALS) ×1 IMPLANT
SPONGE DRAIN TRACH 4X4 STRL 2S (GAUZE/BANDAGES/DRESSINGS) ×1 IMPLANT
SPONGE GAUZE 2X2 8PLY STRL LF (GAUZE/BANDAGES/DRESSINGS) ×3 IMPLANT
STAPLER VISISTAT (STAPLE) ×1 IMPLANT
STRIP CLOSURE SKIN 1/2X4 (GAUZE/BANDAGES/DRESSINGS) ×2 IMPLANT
SUT ETHILON 3 0 FSL (SUTURE) ×1 IMPLANT
SUT MNCRL AB 4-0 PS2 18 (SUTURE) ×2 IMPLANT
SUT VIC AB 2-0 CT2 27 (SUTURE) ×2 IMPLANT
TAPE CLOTH SURG 4X10 WHT LF (GAUZE/BANDAGES/DRESSINGS) ×1 IMPLANT
TRAY FOLEY CATH 16FR SILVER (SET/KITS/TRAYS/PACK) ×2 IMPLANT
TROCAR ENDO BLADELESS 11MM (ENDOMECHANICALS) ×1 IMPLANT
TROCAR ENDO BLADELESS 12MM (ENDOMECHANICALS) ×1 IMPLANT
TROCAR XCEL NON-BLD 5MMX100MML (ENDOMECHANICALS) ×1 IMPLANT
TUBING INSUFFLATION (TUBING) ×2 IMPLANT
WARMER LAPAROSCOPE (MISCELLANEOUS) ×2 IMPLANT

## 2013-01-10 NOTE — Anesthesia Postprocedure Evaluation (Signed)
  Anesthesia Post-op Note  Patient: [name] [name]  Procedure(s) Performed: Procedure(s): APPENDECTOMY LAPAROSCOPIC (N/A)  Patient Location: PACU  Anesthesia Type:General  Level of Consciousness: awake, alert , oriented and patient cooperative  Airway and Oxygen Therapy: Patient Spontanous Breathing and Patient connected to face mask oxygen  Post-op Pain: 4 /10, moderate  Post-op Assessment: Post-op Vital signs reviewed, Patient's Cardiovascular Status Stable, Respiratory Function Stable, Patent Airway, No signs of Nausea or vomiting and Pain level controlled  Post-op Vital Signs: Reviewed and stable  Complications: No apparent anesthesia complications

## 2013-01-10 NOTE — Progress Notes (Signed)
From ER. Admitted to PACU to be monitored. Awake. Oriented. Denies pain. IV infusing without difficulty. Daughter at side.

## 2013-01-10 NOTE — ED Notes (Signed)
Pt c/o r sided abd pain since yesterday.  Went to PCP today and was sent here for CT.  CT reports shows ruptured appendix.

## 2013-01-10 NOTE — ED Notes (Signed)
Pt going to recovery room to be monitored until ready for or

## 2013-01-10 NOTE — ED Notes (Signed)
Dr. Leticia Penna in to evaluate pt

## 2013-01-10 NOTE — Anesthesia Procedure Notes (Signed)
Procedure Name: Intubation Date/Time: 01/10/2013 8:13 PM Performed by: Despina Hidden Pre-anesthesia Checklist: Emergency Drugs available, Patient identified, Suction available and Patient being monitored Patient Re-evaluated:Patient Re-evaluated prior to inductionOxygen Delivery Method: Circle system utilized Preoxygenation: Pre-oxygenation with 100% oxygen Intubation Type: IV induction and Rapid sequence Ventilation: Mask ventilation without difficulty and Oral airway inserted - appropriate to patient size Laryngoscope Size: Mac and 3 Grade View: Grade I Tube type: Oral Number of attempts: 1 Airway Equipment and Method: Stylet and Oral airway Placement Confirmation: ETT inserted through vocal cords under direct vision,  positive ETCO2 and breath sounds checked- equal and bilateral Secured at: 22 cm Tube secured with: Tape Dental Injury: Teeth and Oropharynx as per pre-operative assessment

## 2013-01-10 NOTE — ED Provider Notes (Addendum)
CSN: [id number]     Arrival date & time 01/10/13  1656 History   First MD Initiated Contact with Patient 01/10/13 1714     Chief Complaint  Patient presents with  . ruptured appendix    (Consider location/radiation/quality/duration/timing/severity/associated sxs/prior Treatment) HPI Comments: 79 year old male with a history of hypertension, coronary disease, diabetes, history of coronary artery bypass grafting. He presents with a complaint of lower abdominal pain which started yesterday, has been persistent, is gradually worsening and is associated with a decreased appetite. There is no complaints of fever or vomiting. The symptoms are persistent, worse with palpation of the abdomen.  The history is provided by the patient, a relative and medical records.    Past Medical History  Diagnosis Date  . HTN (hypertension)   . Hyperlipidemia   . CAD (coronary artery disease) 2002    Nuclear, January, 2011, no ischemia  . Diabetic neuropathy     Possible  . Respiratory failure     vent dependent...acquired pneumonia...2008  . Ejection fraction     EF 50-55%, echo, January, 2011, hypokinesis mid--base inferolateral  . Hx of CABG     1998  . Carotid artery disease     Doppler, February, 2012, 0-39% bilateral  . Shortness of breath     2011   Past Surgical History  Procedure Laterality Date  . Coronary artery bypass graft    . Knee arthroplasty      total  . Carpal tunnel release     Family History  Problem Relation Age of Onset  . Heart disease Father     mother  . Heart failure Mother 52    deceased  . Heart attack Father 55    deceased   History  Substance Use Topics  . Smoking status: Never Smoker   . Smokeless tobacco: Not on file  . Alcohol Use: No    Review of Systems  All other systems reviewed and are negative.    Allergies  Review of patient's allergies indicates no known allergies.  Home Medications   Current Outpatient Rx  Name  Route  Sig  Dispense   Refill  . aspirin 81 MG tablet   Oral   Take 81 mg by mouth daily.         Marland Kitchen etodolac (LODINE) 200 MG capsule   Oral   Take 1 capsule (200 mg total) by mouth every 8 (eight) hours. 1 tablet 2 x week   60 capsule   11   . glimepiride (AMARYL) 2 MG tablet   Oral   Take 1 tablet (2 mg total) by mouth daily before breakfast.   30 tablet   11   . lovastatin (MEVACOR) 20 MG tablet   Oral   Take 1 tablet (20 mg total) by mouth at bedtime.   30 tablet   11   . metoprolol succinate (TOPROL-XL) 50 MG 24 hr tablet   Oral   Take 1 tablet (50 mg total) by mouth 2 (two) times daily.   60 tablet   3   . Multiple Vitamin (MULTIVITAMIN) tablet   Oral   Take 1 tablet by mouth daily.         Marland Kitchen OMEPRAZOLE PO   Oral   Take by mouth daily.          BP 118/94  Pulse 78  Temp(Src) 98.4 F (36.9 C) (Oral)  Resp 20  Ht 5\' 9"  (1.753 m)  Wt 200 lb (90.719 kg)  BMI  29.52 kg/m2  SpO2 100% Physical Exam  Nursing note and vitals reviewed. Constitutional: He appears well-developed and well-nourished. No distress.  HENT:  Head: Normocephalic and atraumatic.  Mouth/Throat: Oropharynx is clear and moist. No oropharyngeal exudate.  Eyes: Conjunctivae and EOM are normal. Pupils are equal, round, and reactive to light. Right eye exhibits no discharge. Left eye exhibits no discharge. No scleral icterus.  Neck: Normal range of motion. Neck supple. No JVD present. No thyromegaly present.  Cardiovascular: Normal rate, regular rhythm, normal heart sounds and intact distal pulses.  Exam reveals no gallop and no friction rub.   No murmur heard. Pulmonary/Chest: Effort normal and breath sounds normal. No respiratory distress. He has no wheezes. He has no rales.  Abdominal: Soft. Bowel sounds are normal. He exhibits no distension and no mass. There is tenderness ( Right lower quadrant tenderness with guarding, moderate left lower quadrant tenderness, no upper abdominal tenderness, no peritoneal  signs).  Musculoskeletal: Normal range of motion. He exhibits no edema and no tenderness.  Lymphadenopathy:    He has no cervical adenopathy.  Neurological: He is alert. Coordination normal.  Skin: Skin is warm and dry. No rash noted. No erythema.  Psychiatric: He has a normal mood and affect. His behavior is normal.    ED Course  Procedures (including critical care time) Labs Review Labs Reviewed  CBC  BASIC METABOLIC PANEL  PROTIME-INR  TYPE AND SCREEN   Imaging Review Ct Abdomen Pelvis W Contrast  01/10/2013   CLINICAL DATA:  right-sided abdominal pain with low-grade fever  EXAM: CT ABDOMEN AND PELVIS WITH CONTRAST  TECHNIQUE: Multidetector CT imaging of the abdomen and pelvis was performed using the standard protocol following bolus administration of intravenous contrast. Oral contrast was also administered.  CONTRAST:  OMNIPAQUE IOHEXOL 300 MG/ML  SOLN  COMPARISON:  None.  FINDINGS: There is scarring in the anterior lung base. There is extensive coronary artery calcification.  There is fatty change in the liver. No focal liver lesions are identified. There is no biliary duct dilatation. Gallbladder wall is not thickened.  The spleen, pancreas, and adrenals appear normal. The kidneys bilaterally show no mass or hydronephrosis on either side.  In the pelvis, the appendix is dilated with extensive surrounding inflammation consistent with acute appendicitis. There is no well-defined abscess. Elsewhere in the pelvis, there is no mass or fluid collection. Prostate is mildly enlarged.  There is no bowel obstruction. No free air or portal venous air.  There is no ascites, adenopathy, or abscess in the abdomen or pelvis.  There is extensive atherosclerotic change in the aorta but no aneurysm. There is extensive arthropathy throughout the lumbar spine. There are no blastic or lytic bone lesions.  IMPRESSION: Evidence of acute appendicitis. No frank abscess. No bowel obstruction.  Fatty liver.   Extensive lumbar arthropathy. Extensive atherosclerotic change.  Critical Value/emergent results were called by telephone at the time of interpretation on 01/10/2013 at 4:47 PM to Dr.Donald Christell Constant , who verbally acknowledged these results.   Electronically Signed   By: Bretta Bang M.D.   On: 01/10/2013 16:53   Dg Chest Port 1 View  01/10/2013   CLINICAL DATA:  Initial encounter for current episode of shortness of breath. Current history of coronary artery disease, prior CABG. Current history of hypertension, diabetes, and hyperlipidemia.  EXAM: PORTABLE CHEST - 1 VIEW  COMPARISON:  Two-view chest x-ray 04/02/2009 and portable chest x-rays 07/12/2006 dating back to 06/30/2006.  FINDINGS: Prior sternotomy for CABG. Cardiac silhouette  mildly to moderately enlarged but stable, allowing for differences in technique. Pulmonary vascularity normal without evidence of pulmonary edema. Mildly prominent bronchovascular markings diffusely and mild to moderate central peribronchial thickening, more so than on the prior examination from 03/2009. No confluent airspace consolidation. No pleural effusions. No pneumothorax.  IMPRESSION: Mild to moderate changes of acute bronchitis and/or asthma without localized airspace pneumonia. Stable mild to moderate cardiomegaly without pulmonary edema.   Electronically Signed   By: Hulan Saas M.D.   On: 01/10/2013 17:38    EKG Interpretation   None       MDM   1. Acute appendicitis    On exam the patient has significant tenderness in the right lower quadrant. His CT scan does reveal acute appendicitis without signs of rupture or abscess. We'll discuss with the general surgeon, obtain preop labs and EKG and chest x-ray. He does appear hemodynamically stable, is afebrile and has a normal pulse and blood pressure.  Discussed Alverio with Dr. Leticia Penna who agrees with admission to the hospital and will take the patient to the operating room after his current procedure.  Requests Invanz for antibiotics.  ED ECG REPORT  I personally interpreted this EKG   Date: 01/10/2013   Rate: 69  Rhythm: normal sinus rhythm  QRS Axis: left  Intervals: normal  ST/T Wave abnormalities: nonspecific ST/T changes  Conduction Disutrbances:none  Narrative Interpretation: LVH present  Old EKG Reviewed: unchanged    Vida Roller, MD 01/10/13 1742  Vida Roller, MD 01/10/13 [phone]

## 2013-01-10 NOTE — Progress Notes (Signed)
  Subjective:    Patient ID: [name] [name], male    DOB: Oct 19, 1918, 79 y.o.   MRN: [id number]  HPI Pt presents today with RLQ pain x 2 days Pt lives at home with his wife, with frequent visits from childrent.  Pt is here with daughter today.  Daughter states that her brother noticed pt with frequent complaint of RLQ TTP yesterday that has persisted.  Per daughter, pt has a very high pain threshold and only reports pain when it is signficant.  Pt, family deny any recent infections or illness.  No known trauma.  Pain is described as constant and sharp. Tender to touch.  No burning or paresthesias.  No rash.  Pt states that his bowel movements are regular.  No dysuria or diarrhea.     Review of Systems  All other systems reviewed and are negative.       Objective:   Physical Exam  Constitutional: He appears well-developed and well-nourished.  HENT:  Head: Normocephalic and atraumatic.  Eyes: Conjunctivae are normal. Pupils are equal, round, and reactive to light.  Neck: Normal range of motion.  Cardiovascular: Normal rate and regular rhythm.   Pulmonary/Chest: Effort normal.  Abdominal: Soft. Bowel sounds are normal.  + marked RLQ TTP, reproducible.  ? Ventral hernia     Filed Vitals:   01/10/13 1145  BP: 132/65  Pulse: 57  Temp: 97.1 F (36.2 C)    Lab Results  Component Value Date   WBC 14.7* 01/10/2013   HGB 14.8 01/10/2013   HCT 42.6* 01/10/2013   MCV 97.4* 01/10/2013   PLT 208 01/01/2013   Urinalysis    Component Value Date/Time   BILIRUBINUR neg 01/10/2013 1311   UROBILINOGEN negative 01/10/2013 1311   NITRITE neg 01/10/2013 1311   LEUKOCYTESUR Negative 01/10/2013 1311            Assessment & Plan:  Abdominal  pain, other specified site - Plan: POCT UA - Microscopic Only, POCT urinalysis dipstick, CT Abdomen Pelvis W Contrast, POCT CBC  DDX for sxs is fairly broad including colitis, appendicitis, nephrolithiasis, mass, obstruction. Overall  well appearing and jovial apart from reprducible RLQ TTP.  Will send for CT abd and pelvis with iv and oral contrast to better assess anatomy.  Noted leukocytosis today. Unclear etiology in otherwise hemodynamically stable, afebrile pt pending CT.  Hemodynamically stable.  UA WNL.  Urine culture given age. Will hold on abx pending CT.  Discussed general and GU/GI red flags.

## 2013-01-10 NOTE — H&P (Signed)
[name] [name] is an 79 y.o. male.   Chief Complaint: Right lower quadrant pain HPI: Patient is a 79 year old male who presented from [facility] for CT evaluation of his abdomen and pelvis with several day history of increasing right lower quadrant abdominal pain. Pain is increased insidiously. It is been constant. No significant radiation. Exacerbated with palpation and movement. Appetite is diminished although he has had a diet with breakfast morning. Since that time he has had nothing else the or drink. He has had associated nausea but no emesis. No change in bowel movements with no melena or hematochezia. No similar symptomatology in the past. Subjective fevers and chills. No sick contacts. No unusual travel.  Past Medical History  Diagnosis Date  . HTN (hypertension)   . Hyperlipidemia   . CAD (coronary artery disease) 2002    Nuclear, January, 2011, no ischemia  . Diabetic neuropathy     Possible  . Respiratory failure     vent dependent...acquired pneumonia...2008  . Ejection fraction     EF 50-55%, echo, January, 2011, hypokinesis mid--base inferolateral  . Hx of CABG     1998  . Carotid artery disease     Doppler, February, 2012, 0-39% bilateral  . Shortness of breath     2011    Past Surgical History  Procedure Laterality Date  . Coronary artery bypass graft    . Knee arthroplasty      total  . Carpal tunnel release      Family History  Problem Relation Age of Onset  . Heart disease Father     mother  . Heart failure Mother 1    deceased  . Heart attack Father 79    deceased   Social History:  reports that he has never smoked. He does not have any smokeless tobacco history on file. He reports that he does not drink alcohol or use illicit drugs.  Allergies: No Known Allergies   (Not in a hospital admission)  Results for orders placed during the hospital encounter of 01/10/13 (from the past 48 hour(s))  CBC     Status: Abnormal   Collection  Time    01/10/13  5:55 PM      Result Value Range   WBC 14.4 (*) 4.0 - 10.5 K/uL   RBC 4.05 (*) 4.22 - 5.81 MIL/uL   Hemoglobin 14.1  13.0 - 17.0 g/dL   HCT 96.0  45.4 - 09.8 %   MCV 99.0  78.0 - 100.0 fL   MCH 34.8 (*) 26.0 - 34.0 pg   MCHC 35.2  30.0 - 36.0 g/dL   RDW 11.9  14.7 - 82.9 %   Platelets 200  150 - 400 K/uL  BASIC METABOLIC PANEL     Status: Abnormal   Collection Time    01/10/13  5:55 PM      Result Value Range   Sodium 132 (*) 135 - 145 mEq/L   Potassium 3.9  3.5 - 5.1 mEq/L   Chloride 95 (*) 96 - 112 mEq/L   CO2 27  19 - 32 mEq/L   Glucose, Bld 147 (*) 70 - 99 mg/dL   BUN 17  6 - 23 mg/dL   Creatinine, Ser 5.62  0.50 - 1.35 mg/dL   Calcium 9.5  8.4 - 13.0 mg/dL   GFR calc non Af Amer 52 (*) >90 mL/min   GFR calc Af Amer 60 (*) >90 mL/min   Comment: (NOTE)     The  eGFR has been calculated using the CKD EPI equation.     This calculation has not been validated in all clinical situations.     eGFR's persistently <90 mL/min signify possible Chronic Kidney     Disease.  TYPE AND SCREEN     Status: None   Collection Time    01/10/13  5:55 PM      Result Value Range   ABO/RH(D) AB NEG     Antibody Screen PENDING     Sample Expiration 01/13/2013    PROTIME-INR     Status: None   Collection Time    01/10/13  5:55 PM      Result Value Range   Prothrombin Time 14.8  11.6 - 15.2 seconds   INR 1.19  0.00 - 1.49   Ct Abdomen Pelvis W Contrast  01/10/2013   CLINICAL DATA:  right-sided abdominal pain with low-grade fever  EXAM: CT ABDOMEN AND PELVIS WITH CONTRAST  TECHNIQUE: Multidetector CT imaging of the abdomen and pelvis was performed using the standard protocol following bolus administration of intravenous contrast. Oral contrast was also administered.  CONTRAST:  OMNIPAQUE IOHEXOL 300 MG/ML  SOLN  COMPARISON:  None.  FINDINGS: There is scarring in the anterior lung base. There is extensive coronary artery calcification.  There is fatty change in the liver.  No focal liver lesions are identified. There is no biliary duct dilatation. Gallbladder wall is not thickened.  The spleen, pancreas, and adrenals appear normal. The kidneys bilaterally show no mass or hydronephrosis on either side.  In the pelvis, the appendix is dilated with extensive surrounding inflammation consistent with acute appendicitis. There is no well-defined abscess. Elsewhere in the pelvis, there is no mass or fluid collection. Prostate is mildly enlarged.  There is no bowel obstruction. No free air or portal venous air.  There is no ascites, adenopathy, or abscess in the abdomen or pelvis.  There is extensive atherosclerotic change in the aorta but no aneurysm. There is extensive arthropathy throughout the lumbar spine. There are no blastic or lytic bone lesions.  IMPRESSION: Evidence of acute appendicitis. No frank abscess. No bowel obstruction.  Fatty liver.  Extensive lumbar arthropathy. Extensive atherosclerotic change.  Critical Value/emergent results were called by telephone at the time of interpretation on 01/10/2013 at 4:47 PM to Dr.Donald Christell Constant , who verbally acknowledged these results.   Electronically Signed   By: Bretta Bang M.D.   On: 01/10/2013 16:53   Dg Chest Port 1 View  01/10/2013   CLINICAL DATA:  Initial encounter for current episode of shortness of breath. Current history of coronary artery disease, prior CABG. Current history of hypertension, diabetes, and hyperlipidemia.  EXAM: PORTABLE CHEST - 1 VIEW  COMPARISON:  Two-view chest x-ray 04/02/2009 and portable chest x-rays 07/12/2006 dating back to 06/30/2006.  FINDINGS: Prior sternotomy for CABG. Cardiac silhouette mildly to moderately enlarged but stable, allowing for differences in technique. Pulmonary vascularity normal without evidence of pulmonary edema. Mildly prominent bronchovascular markings diffusely and mild to moderate central peribronchial thickening, more so than on the prior examination from 03/2009. No  confluent airspace consolidation. No pleural effusions. No pneumothorax.  IMPRESSION: Mild to moderate changes of acute bronchitis and/or asthma without localized airspace pneumonia. Stable mild to moderate cardiomegaly without pulmonary edema.   Electronically Signed   By: Hulan Saas M.D.   On: 01/10/2013 17:38    Review of Systems  Constitutional: Positive for fever and chills.  HENT: Negative.   Eyes: Negative.  Respiratory: Negative.   Cardiovascular: Negative.   Gastrointestinal: Positive for nausea, vomiting and abdominal pain (right lower quadrant). Negative for diarrhea, constipation, blood in stool and melena.  Genitourinary: Negative.   Musculoskeletal: Negative.   Skin: Negative.   Neurological: Negative.   Endo/Heme/Allergies: Negative.   Psychiatric/Behavioral: Negative.     Blood pressure 118/94, pulse 78, temperature 98.4 F (36.9 C), temperature source Oral, resp. rate 20, height 5\' 9"  (1.753 m), weight 90.719 kg (200 lb), SpO2 100.00%. Physical Exam  Constitutional: He is oriented to person, place, and time. He appears well-developed and well-nourished. No distress.  Elderly, moderately obese  HENT:  Head: Normocephalic and atraumatic.  Eyes: Conjunctivae and EOM are normal. Pupils are equal, round, and reactive to light.  Neck: Normal range of motion. Neck supple. No tracheal deviation present. No thyromegaly present.  Cardiovascular: Normal rate and regular rhythm.   Respiratory: Effort normal and breath sounds normal.  GI: He exhibits no distension and no mass. There is tenderness (right lower quadrant at McBurney's point.). There is rebound and guarding.  Lymphadenopathy:    He has no cervical adenopathy.  Neurological: He is alert and oriented to person, place, and time.  Skin: Skin is warm and dry.     Assessment/Plan Acute appendicitis. At this time based on his clinical as well as radiographic images there is new high suspicion of perforation  however it is been consulted is a laparoscopic possible open appendectomy for both acute appendicitis as well as perforated appendicitis have been discussed at length with the patient and family. Risk including but not limited to risk of bleeding, infection, appendiceal stump leak, intraoperative cardiac component events have all been discussed with patient and family. Patient is a slightly higher risk due to his history of coronary artery disease however this appears to be well-controlled stable with no acute episodes or issues in the last several years. At this point but continued n.p.o. status continue IV fluid hydration continued on IV antibiotics. Pincus Sanes has been given to the patient in the emergency department. Continue DVT prophylaxis. We'll proceed to the operating room.  Story Vanvranken C 01/10/2013, 6:53 PM

## 2013-01-10 NOTE — Anesthesia Preprocedure Evaluation (Addendum)
Anesthesia Evaluation  Patient identified by MRN, date of birth, ID band Patient awake    Reviewed: Allergy & Precautions, H&P , NPO status , Patient's Chart, lab work & pertinent test results, reviewed documented beta blocker date and time   Airway Mallampati: I TM Distance: >3 FB     Dental  (+) Teeth Intact   Pulmonary shortness of breath, pneumonia -, resolved, COPD breath sounds clear to auscultation        Cardiovascular hypertension, Pt. on medications and Pt. on home beta blockers + CAD and + CABG Rhythm:Regular Rate:Normal + Carotid Bruit    Neuro/Psych    GI/Hepatic Patient received Oral Contrast Agents,  Endo/Other  diabetes, Well Controlled, Oral Hypoglycemic Agents  Renal/GU      Musculoskeletal  (+) Arthritis -,   Abdominal (+) + obese,  Abdomen: soft. Bowel sounds: absent.  Peds  Hematology   Anesthesia Other Findings   Reproductive/Obstetrics                          Anesthesia Physical Anesthesia Plan  ASA: III and emergent  Anesthesia Plan: General   Post-op Pain Management:    Induction: Intravenous, Rapid sequence and Cricoid pressure planned  Airway Management Planned: Oral ETT  Additional Equipment:   Intra-op Plan:   Post-operative Plan: Extubation in OR  Informed Consent:   Dental advisory given  Plan Discussed with:   Anesthesia Plan Comments:         Anesthesia Quick Evaluation

## 2013-01-10 NOTE — Transfer of Care (Signed)
Immediate Anesthesia Transfer of Care Note  Patient: [name] [name]  Procedure(s) Performed: Procedure(s): APPENDECTOMY LAPAROSCOPIC (N/A)  Patient Location: PACU  Anesthesia Type:General  Level of Consciousness: awake  Airway & Oxygen Therapy: Patient Spontanous Breathing and Patient connected to face mask oxygen  Post-op Assessment: Report given to PACU RN, Post -op Vital signs reviewed and stable and Patient moving all extremities  Post vital signs: Reviewed and stable  Complications: No apparent anesthesia complications

## 2013-01-11 LAB — BASIC METABOLIC PANEL
Chloride: 98 mEq/L (ref 96–112)
GFR calc Af Amer: 55 mL/min — ABNORMAL LOW (ref >90)
Potassium: 4.3 mEq/L (ref 3.5–5.1)
Sodium: 134 mEq/L — ABNORMAL LOW (ref 135–145)

## 2013-01-11 LAB — CBC
HCT: 37.2 % — ABNORMAL LOW (ref 39.0–52.0)
Hemoglobin: 12.6 g/dL — ABNORMAL LOW (ref 13.0–17.0)
RBC: 3.73 MIL/uL — ABNORMAL LOW (ref 4.22–5.81)
WBC: 14.3 10*3/uL — ABNORMAL HIGH (ref 4.0–10.5)

## 2013-01-11 NOTE — Progress Notes (Signed)
UR Chart Review Completed  

## 2013-01-11 NOTE — Anesthesia Postprocedure Evaluation (Signed)
  Anesthesia Post-op Note  Patient: [name] [name]  Procedure(s) Performed: Procedure(s): APPENDECTOMY LAPAROSCOPIC (N/A)  Patient Location: Room 310  Anesthesia Type:General  Level of Consciousness: awake, alert , oriented and patient cooperative  Airway and Oxygen Therapy: Patient Spontanous Breathing and Patient connected to nasal cannula oxygen  Post-op Pain: mild  Post-op Assessment: Post-op Vital signs reviewed, Patient's Cardiovascular Status Stable, Respiratory Function Stable, Patent Airway, No signs of Nausea or vomiting and Pain level controlled  Post-op Vital Signs: Reviewed and stable  Complications: No apparent anesthesia complications

## 2013-01-11 NOTE — Care Management Note (Signed)
    Page 1 of 1   01/11/2013     3:37:19 PM   CARE MANAGEMENT NOTE 01/11/2013  Patient:  [name] [name], [name] [name]   Account Number:  [id number]  Date Initiated:  01/11/2013  Documentation initiated by:  Sharrie Rothman  Subjective/Objective Assessment:   Pt admitted from home s/p appendectomy due to ruptured appendix. Pt lives with his wife and will return home at discharge. Pt stated that he was independent with ADL's.     Action/Plan:   No CM needs noted.   Anticipated DC Date:  01/11/2013   Anticipated DC Plan:  HOME/SELF CARE      DC Planning Services  CM consult      Choice offered to / List presented to:             Status of service:  Completed, signed off Medicare Important Message given?   (If response is "NO", the following Medicare IM given date fields will be blank) Date Medicare IM given:   Date Additional Medicare IM given:    Discharge Disposition:  HOME/SELF CARE  Per UR Regulation:    If discussed at Long Length of Stay Meetings, dates discussed:    Comments:  01/11/13 1540 Arlyss Queen, RN BSN CM

## 2013-01-11 NOTE — Progress Notes (Signed)
Inpatient Diabetes Program Recommendations  AACE/ADA: New Consensus Statement on Inpatient Glycemic Control (2013)  Target Ranges:  Prepandial:   less than 140 mg/dL      Peak postprandial:   less than 180 mg/dL (1-2 hours)      Critically ill patients:  140 - 180 mg/dL   Results for [name], [name] BOER (MRN [id number]) as of 01/11/2013 11:29  Ref. Range 01/10/2013 19:04 01/10/2013 21:55  Glucose-Capillary Latest Range: 70-99 mg/dL 409 (H) 811 (H)  Results for [name], [name] BARTNICK (MRN [id number]) as of 01/11/2013 11:29  Ref. Range 01/11/2013 05:16  Glucose Latest Range: 70-99 mg/dL 213 (H)    Inpatient Diabetes Program Recommendations Correction (SSI): Please order CBGs ACHS with Novolog sensitive correction while inpatient.  Note: Patient has a history of diabetes and takes Amaryl 2 mg QAM as an outpatient for diabetes management.  Currently, patient is ordered to receive Amaryl 2 mg QAM for inpatient glycemic control.  Fasting glucose this morning on labs was noted to be 209 mg/dl.  There is not a current order for CBGs.  While inpatient, please order CBGs ACHS with Novolog sensitive correction scale.  Will continue to follow.  Thanks, Orlando Penner, RN, MSN, CCRN Diabetes Coordinator Inpatient Diabetes Program [phone] (Team Pager) [phone] (AP office) [phone] [facility] office)

## 2013-01-12 ENCOUNTER — Encounter (HOSPITAL_COMMUNITY): Payer: Self-pay | Admitting: General Surgery

## 2013-01-12 LAB — URINE CULTURE

## 2013-01-12 MED ORDER — INFLUENZA VAC SPLIT QUAD 0.5 ML IM SUSP
0.5000 mL | Freq: Once | INTRAMUSCULAR | Status: AC
  Administered 2013-01-12: 0.5 mL via INTRAMUSCULAR
  Filled 2013-01-12: qty 0.5

## 2013-01-12 MED ORDER — SODIUM CHLORIDE 0.9 % IV SOLN
1.0000 g | INTRAVENOUS | Status: DC
  Administered 2013-01-12 – 2013-01-13 (×2): 1 g via INTRAVENOUS
  Filled 2013-01-12 (×3): qty 1

## 2013-01-12 MED ORDER — PANTOPRAZOLE SODIUM 40 MG PO TBEC
40.0000 mg | DELAYED_RELEASE_TABLET | Freq: Every day | ORAL | Status: DC
  Administered 2013-01-12 – 2013-01-13 (×2): 40 mg via ORAL
  Filled 2013-01-12 (×2): qty 1

## 2013-01-12 NOTE — Progress Notes (Signed)
2 Days Post-Op  Subjective: Pain about the same. No fevers or chills. No nausea.  Objective: Vital signs in last 24 hours: Temp:  [97.4 F (36.3 C)-98.4 F (36.9 C)] 98.4 F (36.9 C) (10/17 0507) Pulse Rate:  [62-86] 63 (10/17 0507) Resp:  [20] 20 (10/17 0507) BP: (118-171)/(53-77) 140/61 mmHg (10/17 0507) SpO2:  [90 %-96 %] 96 % (10/17 0507) Last BM Date: 01/10/13  Intake/Output from previous day: 10/16 0701 - 10/17 0700 In: 2151.3 [P.O.:480; I.V.:1671.3] Out: 725 [Urine:500; Drains:225] Intake/Output this shift: Total I/O In: 460 [P.O.:460] Out: 325 [Urine:325]  General appearance: alert and no distress GI: Intermittent but relatively quiet bowel sounds, soft, mild distention, JP is slightly cloudy and serious.  Lab Results:   Recent Labs  01/10/13 1755 01/11/13 0516  WBC 14.4* 14.3*  HGB 14.1 12.6*  HCT 40.1 37.2*  PLT 200 173   BMET  Recent Labs  01/10/13 1755 01/11/13 0516  NA 132* 134*  K 3.9 4.3  CL 95* 98  CO2 27 26  GLUCOSE 147* 209*  BUN 17 17  CREATININE 1.18 1.27  CALCIUM 9.5 8.8   PT/INR  Recent Labs  01/10/13 1755  LABPROT 14.8  INR 1.19   ABG No results found for this basename: PHART, PCO2, PO2, HCO3,  in the last 72 hours  Studies/Results: Ct Abdomen Pelvis W Contrast  01/10/2013   CLINICAL DATA:  right-sided abdominal pain with low-grade fever  EXAM: CT ABDOMEN AND PELVIS WITH CONTRAST  TECHNIQUE: Multidetector CT imaging of the abdomen and pelvis was performed using the standard protocol following bolus administration of intravenous contrast. Oral contrast was also administered.  CONTRAST:  OMNIPAQUE IOHEXOL 300 MG/ML  SOLN  COMPARISON:  None.  FINDINGS: There is scarring in the anterior lung base. There is extensive coronary artery calcification.  There is fatty change in the liver. No focal liver lesions are identified. There is no biliary duct dilatation. Gallbladder wall is not thickened.  The spleen, pancreas, and  adrenals appear normal. The kidneys bilaterally show no mass or hydronephrosis on either side.  In the pelvis, the appendix is dilated with extensive surrounding inflammation consistent with acute appendicitis. There is no well-defined abscess. Elsewhere in the pelvis, there is no mass or fluid collection. Prostate is mildly enlarged.  There is no bowel obstruction. No free air or portal venous air.  There is no ascites, adenopathy, or abscess in the abdomen or pelvis.  There is extensive atherosclerotic change in the aorta but no aneurysm. There is extensive arthropathy throughout the lumbar spine. There are no blastic or lytic bone lesions.  IMPRESSION: Evidence of acute appendicitis. No frank abscess. No bowel obstruction.  Fatty liver.  Extensive lumbar arthropathy. Extensive atherosclerotic change.  Critical Value/emergent results were called by telephone at the time of interpretation on 01/10/2013 at 4:47 PM to Dr.Donald Christell Constant , who verbally acknowledged these results.   Electronically Signed   By: Bretta Bang M.D.   On: 01/10/2013 16:53   Dg Chest Port 1 View  01/10/2013   CLINICAL DATA:  Initial encounter for current episode of shortness of breath. Current history of coronary artery disease, prior CABG. Current history of hypertension, diabetes, and hyperlipidemia.  EXAM: PORTABLE CHEST - 1 VIEW  COMPARISON:  Two-view chest x-ray 04/02/2009 and portable chest x-rays 07/12/2006 dating back to 06/30/2006.  FINDINGS: Prior sternotomy for CABG. Cardiac silhouette mildly to moderately enlarged but stable, allowing for differences in technique. Pulmonary vascularity normal without evidence of pulmonary edema.  Mildly prominent bronchovascular markings diffusely and mild to moderate central peribronchial thickening, more so than on the prior examination from 03/2009. No confluent airspace consolidation. No pleural effusions. No pneumothorax.  IMPRESSION: Mild to moderate changes of acute bronchitis and/or  asthma without localized airspace pneumonia. Stable mild to moderate cardiomegaly without pulmonary edema.   Electronically Signed   By: Hulan Saas M.D.   On: 01/10/2013 17:38    Anti-infectives: Anti-infectives   Start     Dose/Rate Route Frequency Ordered Stop   01/11/13 1800  ertapenem [facility]) injection 1 g     1 g Intramuscular Every 24 hours 01/10/13 2230     01/10/13 1745  ertapenem (INVANZ) 1 g in sodium chloride 0.9 % 50 mL IVPB     1 g 100 mL/hr over 30 Minutes Intravenous  Once 01/10/13 1741 01/10/13 1829   01/10/13 1730  Ampicillin-Sulbactam (UNASYN) 3 g in sodium chloride 0.9 % 100 mL IVPB  Status:  Discontinued     3 g 100 mL/hr over 60 Minutes Intravenous  Once 01/10/13 1715 01/10/13 1741      Assessment/Plan: s/p Procedure(s): APPENDECTOMY LAPAROSCOPIC (N/A) Continue IV antibiotics. Continue bowel rest for now. And a with assistance only.  LOS: 2 days    Dain Laseter C 01/12/2013

## 2013-01-12 NOTE — Anesthesia Postprocedure Evaluation (Signed)
  Anesthesia Post-op Note  Patient: [name] [name]  Procedure(s) Performed: Procedure(s): APPENDECTOMY LAPAROSCOPIC (N/A)  Patient Location: Room 310  Anesthesia Type:General  Level of Consciousness: awake, alert , oriented and patient cooperative  Airway and Oxygen Therapy: Patient Spontanous Breathing  Post-op Pain: mild  Post-op Assessment: Post-op Vital signs reviewed, Patient's Cardiovascular Status Stable, Respiratory Function Stable, Patent Airway, No signs of Nausea or vomiting and Pain level controlled  Post-op Vital Signs: Reviewed and stable  Complications: No apparent anesthesia complications

## 2013-01-12 NOTE — Evaluation (Signed)
Physical Therapy Evaluation Patient Details Name: [name] [name] MRN: [id number] DOB: 1918-04-07 Today's Date: 01/12/2013 Time: 0865-7846 PT Time Calculation (min): 35 min  PT Assessment / Plan / Recommendation History of Present Illness  Pt is admitted with a ruptured appendix and underwent laproscopic surgery on 01-10-13.  He currently has  a J tube in abdomen...he denies any pain and feels well today.  He normally is independent at home with no assistive device but had a fall last night when aide was walking him to the bathroom.  Clinical Impression   Pt is seen for evaluation.  He is mildly deconditioned from recent surgery and now has diminished standing dynamic balance.  He currently needs a walker for safe gait..he tends to fall laterally when attempting to walk without assistive device.  Because of decreased balance, I would recommend HHPT at d/c.  He has a walker at home.    PT Assessment  Patient needs continued PT services    Follow Up Recommendations  Home health PT    Does the patient have the potential to tolerate intense rehabilitation      Barriers to Discharge        Equipment Recommendations  None recommended by PT    Recommendations for Other Services     Frequency Min 3X/week    Precautions / Restrictions Precautions Precautions: Fall Restrictions Weight Bearing Restrictions: No   Pertinent Vitals/Pain       Mobility  Bed Mobility Bed Mobility: Left Sidelying to Sit Left Sidelying to Sit: 6: Modified independent (Device/Increase time);HOB flat Details for Bed Mobility Assistance: transfer is slow and labored but he did not need any assistance Transfers Transfers: Sit to Stand;Stand to Sit Sit to Stand: 4: Min guard;From bed;With upper extremity assist Stand to Sit: 4: Min guard;To chair/3-in-1 Details for Transfer Assistance: pt was slightly unsteady upon rising from the bed Ambulation/Gait Ambulation/Gait Assistance: 5: Supervision Ambulation  Distance (Feet): 190 Feet Assistive device: Rolling walker Ambulation/Gait Assistance Details: pt needs a walker for gait due to decreased dynamic standing balance Gait Pattern: Within Functional Limits Gait velocity: WNL General Gait Details: pt leans fairly heavily on the walker Stairs: No Wheelchair Mobility Wheelchair Mobility: No    Exercises     PT Diagnosis: Difficulty walking;Generalized weakness  PT Problem List: Decreased activity tolerance;Decreased balance;Decreased knowledge of use of DME PT Treatment Interventions: Gait training;Stair training;Balance training     PT Goals(Current goals can be found in the care plan section) Acute Rehab PT Goals Patient Stated Goal: return home PT Goal Formulation: With patient Time For Goal Achievement: 01/26/13 Potential to Achieve Goals: Good  Visit Information  Last PT Received On: 01/12/13 History of Present Illness: Pt is admitted with a ruptured appendix and underwent laproscopic surgery on 01-10-13.  He currently has  a J tube in abdomen...he denies any pain and feels well today.  He normally is independent at home with no assistive device but had a fall last night when aide was walking him to the bathroom.       Prior Functioning  Home Living Family/patient expects to be discharged to:: Private residence Living Arrangements: Spouse/significant other Available Help at Discharge: Family;Available 24 hours/day Type of Home: House Home Access: Stairs to enter [employer] of Steps: 3 Entrance Stairs-Rails: Right;Left Home Layout: One level Home Equipment: Walker - 2 wheels;Cane - single point Prior Function Level of Independence: Independent Communication Communication: HOH    Cognition  Cognition Arousal/Alertness: Awake/alert Behavior During Therapy: WFL for tasks  assessed/performed Overall Cognitive Status: Within Functional Limits for tasks assessed    Extremity/Trunk Assessment Upper Extremity  Assessment Upper Extremity Assessment: Overall WFL for tasks assessed Lower Extremity Assessment Lower Extremity Assessment: Overall WFL for tasks assessed   Balance Balance Balance Assessed: Yes Static Standing Balance Static Standing - Balance Support: No upper extremity supported Static Standing - Level of Assistance: 5: Stand by assistance Dynamic Standing Balance Dynamic Standing - Balance Support: No upper extremity supported Dynamic Standing - Level of Assistance: 3: Mod assist (pt falls laterally)  End of Session PT - End of Session Equipment Utilized During Treatment: Gait belt Activity Tolerance: Patient tolerated treatment well Patient left: in chair;with call bell/phone within reach;with chair alarm set Nurse Communication: Mobility status  GP     Myrlene Broker L 01/12/2013, 11:26 AM

## 2013-01-12 NOTE — Progress Notes (Signed)
Patient was being accompanied to the bathroom, lost his balance, and fell. Fall was witnessed by this nurse. Patient did not hit his head. Small skin tear to left hand noted. Says he feels "fine" otherwise. Charge nurse, supervisor, and MD on call notified. Will continue to monitor closely.

## 2013-01-12 NOTE — Progress Notes (Signed)
2 Days Post-Op  Subjective: Pain about the same. No nausea. No fevers or chills.  Objective: Vital signs in last 24 hours: Temp:  [97.4 F (36.3 C)-98.4 F (36.9 C)] 98.4 F (36.9 C) (10/17 0507) Pulse Rate:  [62-86] 63 (10/17 0507) Resp:  [20] 20 (10/17 0507) BP: (118-171)/(53-77) 140/61 mmHg (10/17 0507) SpO2:  [90 %-96 %] 96 % (10/17 0507) Last BM Date: 01/10/13  Intake/Output from previous day: 10/16 0701 - 10/17 0700 In: 2151.3 [P.O.:480; I.V.:1671.3] Out: 725 [Urine:500; Drains:225] Intake/Output this shift: Total I/O In: 460 [P.O.:460] Out: 325 [Urine:325]  General appearance: alert and no distress GI: Quiet, soft, expected postoperative tenderness. No diffuse peritoneal signs. Dressings are clean dry and intact. JP drain demonstrates some purulent discharge.  Lab Results:   Recent Labs  01/10/13 1755 01/11/13 0516  WBC 14.4* 14.3*  HGB 14.1 12.6*  HCT 40.1 37.2*  PLT 200 173   BMET  Recent Labs  01/10/13 1755 01/11/13 0516  NA 132* 134*  K 3.9 4.3  CL 95* 98  CO2 27 26  GLUCOSE 147* 209*  BUN 17 17  CREATININE 1.18 1.27  CALCIUM 9.5 8.8   PT/INR  Recent Labs  01/10/13 1755  LABPROT 14.8  INR 1.19   ABG No results found for this basename: PHART, PCO2, PO2, HCO3,  in the last 72 hours  Studies/Results: Ct Abdomen Pelvis W Contrast  01/10/2013   CLINICAL DATA:  right-sided abdominal pain with low-grade fever  EXAM: CT ABDOMEN AND PELVIS WITH CONTRAST  TECHNIQUE: Multidetector CT imaging of the abdomen and pelvis was performed using the standard protocol following bolus administration of intravenous contrast. Oral contrast was also administered.  CONTRAST:  OMNIPAQUE IOHEXOL 300 MG/ML  SOLN  COMPARISON:  None.  FINDINGS: There is scarring in the anterior lung base. There is extensive coronary artery calcification.  There is fatty change in the liver. No focal liver lesions are identified. There is no biliary duct dilatation. Gallbladder  wall is not thickened.  The spleen, pancreas, and adrenals appear normal. The kidneys bilaterally show no mass or hydronephrosis on either side.  In the pelvis, the appendix is dilated with extensive surrounding inflammation consistent with acute appendicitis. There is no well-defined abscess. Elsewhere in the pelvis, there is no mass or fluid collection. Prostate is mildly enlarged.  There is no bowel obstruction. No free air or portal venous air.  There is no ascites, adenopathy, or abscess in the abdomen or pelvis.  There is extensive atherosclerotic change in the aorta but no aneurysm. There is extensive arthropathy throughout the lumbar spine. There are no blastic or lytic bone lesions.  IMPRESSION: Evidence of acute appendicitis. No frank abscess. No bowel obstruction.  Fatty liver.  Extensive lumbar arthropathy. Extensive atherosclerotic change.  Critical Value/emergent results were called by telephone at the time of interpretation on 01/10/2013 at 4:47 PM to Dr.Donald Christell Constant , who verbally acknowledged these results.   Electronically Signed   By: Bretta Bang M.D.   On: 01/10/2013 16:53   Dg Chest Port 1 View  01/10/2013   CLINICAL DATA:  Initial encounter for current episode of shortness of breath. Current history of coronary artery disease, prior CABG. Current history of hypertension, diabetes, and hyperlipidemia.  EXAM: PORTABLE CHEST - 1 VIEW  COMPARISON:  Two-view chest x-ray 04/02/2009 and portable chest x-rays 07/12/2006 dating back to 06/30/2006.  FINDINGS: Prior sternotomy for CABG. Cardiac silhouette mildly to moderately enlarged but stable, allowing for differences in technique. Pulmonary vascularity  normal without evidence of pulmonary edema. Mildly prominent bronchovascular markings diffusely and mild to moderate central peribronchial thickening, more so than on the prior examination from 03/2009. No confluent airspace consolidation. No pleural effusions. No pneumothorax.  IMPRESSION:  Mild to moderate changes of acute bronchitis and/or asthma without localized airspace pneumonia. Stable mild to moderate cardiomegaly without pulmonary edema.   Electronically Signed   By: Hulan Saas M.D.   On: 01/10/2013 17:38    Anti-infectives: Anti-infectives   Start     Dose/Rate Route Frequency Ordered Stop   01/11/13 1800  ertapenem [facility]) injection 1 g     1 g Intramuscular Every 24 hours 01/10/13 2230     01/10/13 1745  ertapenem (INVANZ) 1 g in sodium chloride 0.9 % 50 mL IVPB     1 g 100 mL/hr over 30 Minutes Intravenous  Once 01/10/13 1741 01/10/13 1829   01/10/13 1730  Ampicillin-Sulbactam (UNASYN) 3 g in sodium chloride 0.9 % 100 mL IVPB  Status:  Discontinued     3 g 100 mL/hr over 60 Minutes Intravenous  Once 01/10/13 1715 01/10/13 1741      Assessment/Plan: s/p Procedure(s): APPENDECTOMY LAPAROSCOPIC (N/A) Status post ruptured appendicitis. Continue IV antibiotics. Continue hours for now. Continue IV fluid hydration.  LOS: 2 days    Rebakah Cokley C 01/12/2013

## 2013-01-13 LAB — CBC
HCT: 38.5 % — ABNORMAL LOW (ref 39.0–52.0)
Hemoglobin: 13.2 g/dL (ref 13.0–17.0)
MCH: 34 pg (ref 26.0–34.0)
MCV: 99.2 fL (ref 78.0–100.0)
Platelets: 233 10*3/uL (ref 150–400)
RBC: 3.88 MIL/uL — ABNORMAL LOW (ref 4.22–5.81)
WBC: 9.9 10*3/uL (ref 4.0–10.5)

## 2013-01-13 LAB — BASIC METABOLIC PANEL
CO2: 27 mEq/L (ref 19–32)
Calcium: 9.1 mg/dL (ref 8.4–10.5)
Chloride: 99 mEq/L (ref 96–112)
Creatinine, Ser: 1.18 mg/dL (ref 0.50–1.35)
GFR calc Af Amer: 60 mL/min — ABNORMAL LOW (ref >90)
Glucose, Bld: 148 mg/dL — ABNORMAL HIGH (ref 70–99)

## 2013-01-13 MED ORDER — POLYETHYLENE GLYCOL 3350 17 G PO PACK
17.0000 g | PACK | Freq: Every day | ORAL | Status: DC
  Administered 2013-01-13 – 2013-01-14 (×2): 17 g via ORAL
  Filled 2013-01-13 (×2): qty 1

## 2013-01-13 NOTE — Progress Notes (Signed)
3 Days Post-Op  Subjective: No complaints of pain. Sitting comfortably in [name] chair.  Objective: Vital signs in last 24 hours: Temp:  [97.6 F (36.4 C)-98.2 F (36.8 C)] 98.2 F (36.8 C) (10/18 0410) Pulse Rate:  [62-70] 62 (10/18 0410) Resp:  [18] 18 (10/18 0410) BP: (124-145)/(67-79) 124/67 mmHg (10/18 0410) SpO2:  [92 %-95 %] 95 % (10/18 0410) Last BM Date: 01/10/13  Intake/Output from previous day: 10/17 0701 - 10/18 0700 In: 3698.8 [P.O.:1765; I.V.:1883.8; IV Piggyback:50] Out: 1445 [Urine:1225; Drains:220] Intake/Output this shift:    General appearance: alert, cooperative and appears stated age Resp: clear to auscultation bilaterally Cardio: regular rate and rhythm, S1, S2 normal, no murmur, click, rub or gallop GI: Soft, dressings dry and intact. JP drainage serosanguineous. No rigidity noted. Occasional bowel sounds appreciated.  Lab Results:   Recent Labs  01/11/13 0516 01/13/13 0623  WBC 14.3* 9.9  HGB 12.6* 13.2  HCT 37.2* 38.5*  PLT 173 233   BMET  Recent Labs  01/11/13 0516 01/13/13 0623  NA 134* 135  K 4.3 4.2  CL 98 99  CO2 26 27  GLUCOSE 209* 148*  BUN 17 19  CREATININE 1.27 1.18  CALCIUM 8.8 9.1   PT/INR  Recent Labs  01/10/13 1755  LABPROT 14.8  INR 1.19    Studies/Results: No results found.  Anti-infectives: Anti-infectives   Start     Dose/Rate Route Frequency Ordered Stop   01/12/13 1800  ertapenem (INVANZ) 1 g in sodium chloride 0.9 % 50 mL IVPB     1 g 100 mL/hr over 30 Minutes Intravenous Every 24 hours 01/12/13 1129     01/11/13 1800  ertapenem [facility]) injection 1 g  Status:  Discontinued     1 g Intramuscular Every 24 hours 01/10/13 2230 01/12/13 1128   01/10/13 1745  ertapenem (INVANZ) 1 g in sodium chloride 0.9 % 50 mL IVPB     1 g 100 mL/hr over 30 Minutes Intravenous  Once 01/10/13 1741 01/10/13 1829   01/10/13 1730  Ampicillin-Sulbactam (UNASYN) 3 g in sodium chloride 0.9 % 100 mL IVPB  Status:  Discontinued      3 g 100 mL/hr over 60 Minutes Intravenous  Once 01/10/13 1715 01/10/13 1741      Assessment/Plan: s/p Procedure(s): APPENDECTOMY LAPAROSCOPIC Impression: Stable, status post laparoscopic appendectomy for perforated appendicitis. Continues to progress well. Patient states he did have [name] bowel movement this morning which was small in nature. Plan: We'll advance to full liquid diet. We'll remove Foley catheter today. Continue IV antibiotics.  LOS: 3 days    [name] [name] 01/13/2013

## 2013-01-14 LAB — BASIC METABOLIC PANEL
BUN: 15 mg/dL (ref 6–23)
CO2: 28 mEq/L (ref 19–32)
Calcium: 8.7 mg/dL (ref 8.4–10.5)
Chloride: 100 mEq/L (ref 96–112)
Creatinine, Ser: 1.1 mg/dL (ref 0.50–1.35)

## 2013-01-14 LAB — CBC
HCT: 39.1 % (ref 39.0–52.0)
MCH: 34 pg (ref 26.0–34.0)
MCHC: 34.5 g/dL (ref 30.0–36.0)
MCV: 98.5 fL (ref 78.0–100.0)
RDW: 12.9 % (ref 11.5–15.5)
WBC: 8.9 10*3/uL (ref 4.0–10.5)

## 2013-01-14 MED ORDER — AMOXICILLIN-POT CLAVULANATE 875-125 MG PO TABS: 1.0000 | ORAL_TABLET | Freq: Two times a day (BID) | ORAL | Status: DC

## 2013-01-14 NOTE — Discharge Summary (Signed)
Physician Discharge Summary  Patient ID: [name] [name] MRN: [id number] DOB/AGE: 1918/06/18 79 y.o.  Admit date: 01/10/2013 Discharge date: 01/14/2013  Admission Diagnoses: Acute appendicitis with perforation  Discharge Diagnoses: Same Active Problems:   * No active hospital problems. *   Discharged Condition: good  Hospital Course: Patient is a 79 year old white male presented emergency room with worsening lower abdominal pain. CT scan the abdomen and pelvis revealed acute appendicitis. Dr. Leticia Penna take the patient to the operating room on 01/10/2013 and a laparoscopic appendectomy for perforated appendicitis. He tolerated the surgery well. His postoperative course has been for the most part unremarkable. His diet was advanced without difficulty once his bowel function returned. His Jackson-Pratt drainage has been serous in nature. No significant leukocytosis over the last few days. His Jackson-Pratt drain has been removed and the patient is being discharged home on 01/14/2013 in good improving condition.  Treatments: surgery: Laparoscopic appendectomy on 01/10/2013  Discharge Exam: Blood pressure 138/72, pulse 62, temperature 97.7 F (36.5 C), temperature source Oral, resp. rate 18, height 5\' 9"  (1.753 m), weight 98.249 kg (216 lb 9.6 oz), SpO2 100.00%. General appearance: alert, cooperative, appears stated age and no distress Resp: clear to auscultation bilaterally Cardio: regular rate and rhythm, S1, S2 normal, no murmur, click, rub or gallop GI: Soft. Incision is healing well. Active bowel sounds appreciated.  Disposition:  home     Medication List         amoxicillin-clavulanate 875-125 MG per tablet  Commonly known as:  AUGMENTIN  Take 1 tablet by mouth 2 (two) times daily.     aspirin 81 MG tablet  Take 81 mg by mouth daily.     etodolac 200 MG capsule  Commonly known as:  LODINE  Take 1 capsule (200 mg total) by mouth every 8 (eight) hours. 1 tablet 2 x week     glimepiride 2 MG tablet  Commonly known as:  AMARYL  Take 1 tablet (2 mg total) by mouth daily before breakfast.     lovastatin 20 MG tablet  Commonly known as:  MEVACOR  Take 1 tablet (20 mg total) by mouth at bedtime.     metoprolol succinate 50 MG 24 hr tablet  Commonly known as:  TOPROL-XL  Take 1 tablet (50 mg total) by mouth 2 (two) times daily.     multivitamin tablet  Take 1 tablet by mouth daily.           Follow-up Information   Follow up with Fabio Bering, MD. Schedule an appointment as soon as possible for a visit on 01/23/2013.   Specialty:  General Surgery   Contact information:   [address] [place] [address] [phone]       Signed: Franky Macho A 01/14/2013, 9:00 AM

## 2013-01-14 NOTE — Discharge Planning (Signed)
DC order modified to request home PT for pt.  However, Dr. Lovell Sheehan was unable to complete face to face before pt DC. Orders and face sheet were faxed to [facility] and message was left on Tammy Blackwell's voice mail that face to face may still need to be completed on Monday (10/20).  Also called [facility] to confirm orders were received.

## 2013-01-14 NOTE — Discharge Planning (Signed)
Pt stated he was ready to go home and he had no pain.  Both IV 's were removed and DC paper were given and educated on S/Sx as to when to call doctor or return to hospital.  Family was in room when DC was completed.  Pt was also given script for anbx and told of FU appointment.  Pt will be wheeled to car by PCT and family when ready.

## 2013-01-14 NOTE — Discharge Planning (Signed)
Paged Dr. Lovell Sheehan to get Home health PT order for [facility] to come out to pt's home.  Also confirming removal of JP drain.

## 2013-01-15 NOTE — Progress Notes (Signed)
UR chart review completed.  

## 2013-01-16 DIAGNOSIS — E1142 Type 2 diabetes mellitus with diabetic polyneuropathy: Secondary | ICD-10-CM | POA: Diagnosis not present

## 2013-01-16 DIAGNOSIS — IMO0001 Reserved for inherently not codable concepts without codable children: Secondary | ICD-10-CM | POA: Diagnosis not present

## 2013-01-16 DIAGNOSIS — Z9181 History of falling: Secondary | ICD-10-CM | POA: Diagnosis not present

## 2013-01-16 DIAGNOSIS — I2510 Atherosclerotic heart disease of native coronary artery without angina pectoris: Secondary | ICD-10-CM | POA: Diagnosis not present

## 2013-01-16 DIAGNOSIS — I10 Essential (primary) hypertension: Secondary | ICD-10-CM | POA: Diagnosis not present

## 2013-01-16 DIAGNOSIS — E1149 Type 2 diabetes mellitus with other diabetic neurological complication: Secondary | ICD-10-CM | POA: Diagnosis not present

## 2013-01-18 ENCOUNTER — Other Ambulatory Visit: Payer: Self-pay | Admitting: Cardiology

## 2013-01-19 DIAGNOSIS — I2510 Atherosclerotic heart disease of native coronary artery without angina pectoris: Secondary | ICD-10-CM | POA: Diagnosis not present

## 2013-01-19 DIAGNOSIS — IMO0001 Reserved for inherently not codable concepts without codable children: Secondary | ICD-10-CM | POA: Diagnosis not present

## 2013-01-19 DIAGNOSIS — I10 Essential (primary) hypertension: Secondary | ICD-10-CM | POA: Diagnosis not present

## 2013-01-19 DIAGNOSIS — E1142 Type 2 diabetes mellitus with diabetic polyneuropathy: Secondary | ICD-10-CM | POA: Diagnosis not present

## 2013-01-19 DIAGNOSIS — E1149 Type 2 diabetes mellitus with other diabetic neurological complication: Secondary | ICD-10-CM | POA: Diagnosis not present

## 2013-01-22 DIAGNOSIS — E1142 Type 2 diabetes mellitus with diabetic polyneuropathy: Secondary | ICD-10-CM | POA: Diagnosis not present

## 2013-01-22 DIAGNOSIS — I10 Essential (primary) hypertension: Secondary | ICD-10-CM | POA: Diagnosis not present

## 2013-01-22 DIAGNOSIS — I2510 Atherosclerotic heart disease of native coronary artery without angina pectoris: Secondary | ICD-10-CM | POA: Diagnosis not present

## 2013-01-22 DIAGNOSIS — IMO0001 Reserved for inherently not codable concepts without codable children: Secondary | ICD-10-CM | POA: Diagnosis not present

## 2013-01-22 DIAGNOSIS — E1149 Type 2 diabetes mellitus with other diabetic neurological complication: Secondary | ICD-10-CM | POA: Diagnosis not present

## 2013-01-24 DIAGNOSIS — I10 Essential (primary) hypertension: Secondary | ICD-10-CM | POA: Diagnosis not present

## 2013-01-24 DIAGNOSIS — E1149 Type 2 diabetes mellitus with other diabetic neurological complication: Secondary | ICD-10-CM | POA: Diagnosis not present

## 2013-01-24 DIAGNOSIS — IMO0001 Reserved for inherently not codable concepts without codable children: Secondary | ICD-10-CM | POA: Diagnosis not present

## 2013-01-24 DIAGNOSIS — E1142 Type 2 diabetes mellitus with diabetic polyneuropathy: Secondary | ICD-10-CM | POA: Diagnosis not present

## 2013-01-24 DIAGNOSIS — I2510 Atherosclerotic heart disease of native coronary artery without angina pectoris: Secondary | ICD-10-CM | POA: Diagnosis not present

## 2013-01-26 DIAGNOSIS — E1142 Type 2 diabetes mellitus with diabetic polyneuropathy: Secondary | ICD-10-CM | POA: Diagnosis not present

## 2013-01-26 DIAGNOSIS — IMO0001 Reserved for inherently not codable concepts without codable children: Secondary | ICD-10-CM | POA: Diagnosis not present

## 2013-01-26 DIAGNOSIS — E1149 Type 2 diabetes mellitus with other diabetic neurological complication: Secondary | ICD-10-CM | POA: Diagnosis not present

## 2013-01-26 DIAGNOSIS — I10 Essential (primary) hypertension: Secondary | ICD-10-CM | POA: Diagnosis not present

## 2013-01-26 DIAGNOSIS — I2510 Atherosclerotic heart disease of native coronary artery without angina pectoris: Secondary | ICD-10-CM | POA: Diagnosis not present

## 2013-01-30 DIAGNOSIS — IMO0001 Reserved for inherently not codable concepts without codable children: Secondary | ICD-10-CM | POA: Diagnosis not present

## 2013-01-30 DIAGNOSIS — I10 Essential (primary) hypertension: Secondary | ICD-10-CM | POA: Diagnosis not present

## 2013-01-30 DIAGNOSIS — I2510 Atherosclerotic heart disease of native coronary artery without angina pectoris: Secondary | ICD-10-CM | POA: Diagnosis not present

## 2013-01-30 DIAGNOSIS — E1149 Type 2 diabetes mellitus with other diabetic neurological complication: Secondary | ICD-10-CM | POA: Diagnosis not present

## 2013-01-30 DIAGNOSIS — E1142 Type 2 diabetes mellitus with diabetic polyneuropathy: Secondary | ICD-10-CM | POA: Diagnosis not present

## 2013-02-02 DIAGNOSIS — IMO0001 Reserved for inherently not codable concepts without codable children: Secondary | ICD-10-CM | POA: Diagnosis not present

## 2013-02-02 DIAGNOSIS — L819 Disorder of pigmentation, unspecified: Secondary | ICD-10-CM | POA: Diagnosis not present

## 2013-02-02 DIAGNOSIS — E1142 Type 2 diabetes mellitus with diabetic polyneuropathy: Secondary | ICD-10-CM | POA: Diagnosis not present

## 2013-02-02 DIAGNOSIS — D235 Other benign neoplasm of skin of trunk: Secondary | ICD-10-CM | POA: Diagnosis not present

## 2013-02-02 DIAGNOSIS — E1149 Type 2 diabetes mellitus with other diabetic neurological complication: Secondary | ICD-10-CM | POA: Diagnosis not present

## 2013-02-02 DIAGNOSIS — I2510 Atherosclerotic heart disease of native coronary artery without angina pectoris: Secondary | ICD-10-CM | POA: Diagnosis not present

## 2013-02-02 DIAGNOSIS — D485 Neoplasm of uncertain behavior of skin: Secondary | ICD-10-CM | POA: Diagnosis not present

## 2013-02-02 DIAGNOSIS — I10 Essential (primary) hypertension: Secondary | ICD-10-CM | POA: Diagnosis not present

## 2013-02-02 DIAGNOSIS — L821 Other seborrheic keratosis: Secondary | ICD-10-CM | POA: Diagnosis not present

## 2013-02-07 DIAGNOSIS — IMO0001 Reserved for inherently not codable concepts without codable children: Secondary | ICD-10-CM | POA: Diagnosis not present

## 2013-02-07 DIAGNOSIS — I2510 Atherosclerotic heart disease of native coronary artery without angina pectoris: Secondary | ICD-10-CM | POA: Diagnosis not present

## 2013-02-07 DIAGNOSIS — I10 Essential (primary) hypertension: Secondary | ICD-10-CM | POA: Diagnosis not present

## 2013-02-07 DIAGNOSIS — E1142 Type 2 diabetes mellitus with diabetic polyneuropathy: Secondary | ICD-10-CM | POA: Diagnosis not present

## 2013-02-07 DIAGNOSIS — E1149 Type 2 diabetes mellitus with other diabetic neurological complication: Secondary | ICD-10-CM | POA: Diagnosis not present

## 2013-05-22 ENCOUNTER — Other Ambulatory Visit: Payer: Self-pay | Admitting: Cardiology

## 2013-06-14 DIAGNOSIS — E1149 Type 2 diabetes mellitus with other diabetic neurological complication: Secondary | ICD-10-CM | POA: Diagnosis not present

## 2013-06-14 DIAGNOSIS — L851 Acquired keratosis [keratoderma] palmaris et plantaris: Secondary | ICD-10-CM | POA: Diagnosis not present

## 2013-06-14 DIAGNOSIS — B351 Tinea unguium: Secondary | ICD-10-CM | POA: Diagnosis not present

## 2013-06-16 ENCOUNTER — Other Ambulatory Visit: Payer: Self-pay | Admitting: Cardiology

## 2013-07-23 ENCOUNTER — Ambulatory Visit (INDEPENDENT_AMBULATORY_CARE_PROVIDER_SITE_OTHER): Payer: Medicare Other | Admitting: Cardiology

## 2013-07-23 ENCOUNTER — Encounter: Payer: Self-pay | Admitting: Cardiology

## 2013-07-23 VITALS — BP 160/80 | HR 80

## 2013-07-23 DIAGNOSIS — I779 Disorder of arteries and arterioles, unspecified: Secondary | ICD-10-CM

## 2013-07-23 DIAGNOSIS — I739 Peripheral vascular disease, unspecified: Secondary | ICD-10-CM

## 2013-07-23 DIAGNOSIS — I10 Essential (primary) hypertension: Secondary | ICD-10-CM

## 2013-07-23 DIAGNOSIS — I2510 Atherosclerotic heart disease of native coronary artery without angina pectoris: Secondary | ICD-10-CM

## 2013-07-23 MED ORDER — METOPROLOL SUCCINATE ER 50 MG PO TB24
50.0000 mg | ORAL_TABLET | Freq: Two times a day (BID) | ORAL | Status: DC
Start: 2013-07-23 — End: 2014-06-27

## 2013-07-23 NOTE — Assessment & Plan Note (Signed)
He had only slight carotid disease in the recent past. We do not need a followup Doppler at this time. Overall he is stable. I'll see him back in a year.

## 2013-07-23 NOTE — Patient Instructions (Signed)
Continue same medication    Your physician wants you to follow-up in: 1 year. You will receive a reminder letter in the mail two months in advance. If you don't receive a letter, please call our office to schedule the follow-up appointment.  

## 2013-07-23 NOTE — Assessment & Plan Note (Signed)
His systolic pressure is slightly elevated by current guidelines. Last sugar was higher and we had his pressures checked at home after the visit. All of those pressures were normal. Therefore I am not inclined to change his medicine at this time.

## 2013-07-23 NOTE — Assessment & Plan Note (Signed)
His coronary disease is stable. He's not having any symptoms. He is on aspirin and a small dose of a statin. He is stable. I'm not inclined to change his medicines.

## 2013-07-23 NOTE — Progress Notes (Signed)
Patient ID: [name] [name], male   DOB: 02-04-1919, 80 y.o.   MRN: [id number]    HPI  Patient is seen today to followup his coronary disease. He underwent CABG many years ago. He has been quite stable. Since I saw him last he was treated for acute appendicitis he had successful laparoscopic appendectomy and did well. He has not been having any chest pain.  No Known Allergies  Current Outpatient Prescriptions  Medication Sig Dispense Refill  . aspirin 81 MG tablet Take 81 mg by mouth daily.      Marland Kitchen etodolac (LODINE) 200 MG capsule Take 400 mg by mouth. 400 MG Monday and Thursdays      . glimepiride (AMARYL) 2 MG tablet Take 1 tablet (2 mg total) by mouth daily before breakfast.  30 tablet  11  . lovastatin (MEVACOR) 20 MG tablet Take 1 tablet (20 mg total) by mouth at bedtime.  30 tablet  11  . metoprolol succinate (TOPROL-XL) 50 MG 24 hr tablet TAKE 1 TABLET (50 MG TOTAL) BY MOUTH 2 (TWO) TIMES DAILY.  60 tablet  3  . Multiple Vitamin (MULTIVITAMIN) tablet Take 1 tablet by mouth daily.      Marland Kitchen omeprazole (PRILOSEC) 20 MG capsule Take 20 mg by mouth. Given on Mondays and Weds       No current facility-administered medications for this visit.    History   Social History  . Marital Status: Married    Spouse Name: N/A    Number of Children: N/A  . Years of Education: N/A   Occupational History  . bricklayer    Social History Main Topics  . Smoking status: Never Smoker   . Smokeless tobacco: Not on file  . Alcohol Use: No  . Drug Use: No  . Sexual Activity: Not on file   Other Topics Concern  . Not on file   Social History Narrative  . No narrative on file    Family History  Problem Relation Age of Onset  . Heart disease Father     mother  . Heart failure Mother 11    deceased  . Heart attack Father 48    deceased    Past Medical History  Diagnosis Date  . HTN (hypertension)   . Hyperlipidemia   . CAD (coronary artery disease) 2002    Nuclear, January, 2011, no  ischemia  . Diabetic neuropathy     Possible  . Respiratory failure     vent dependent...acquired pneumonia...2008  . Ejection fraction     EF 50-55%, echo, January, 2011, hypokinesis mid--base inferolateral  . Hx of CABG     1998  . Carotid artery disease     Doppler, February, 2012, 0-39% bilateral  . Shortness of breath     2011    Past Surgical History  Procedure Laterality Date  . Coronary artery bypass graft    . Knee arthroplasty      total  . Carpal tunnel release    . Laparoscopic appendectomy N/A 01/10/2013    Procedure: APPENDECTOMY LAPAROSCOPIC;  Surgeon: Donato Heinz, MD;  Location: [facility];  Service: General;  Laterality: N/A;    Patient Active Problem List   Diagnosis Date Noted  . HTN (hypertension)   . Hyperlipidemia   . CAD (coronary artery disease)   . Diabetic neuropathy   . Hx of CABG   . Carotid artery disease   . Ejection fraction   . Shortness of breath   .  DM 02/03/2009    ROS   Patient denies fever, chills, headache, sweats, rash, change in vision, change in hearing, chest pain, cough, nausea vomiting, urinary symptoms. All other systems are reviewed and are negative.  PHYSICAL EXAM   Patient oriented to person time and place. Affect is normal. He looks good. He is here with his wife and his daughter. There is no jugulovenous distention. Lungs are clear. Respiratory effort is not labored. Head is atraumatic. Conjunctiva are normal. Cardiac exam reveals S1 and S2. There no clicks or significant murmurs. The abdomen is soft. There is no peripheral edema. There are no musculoskeletal deformities. There are no skin rashes.  Filed Vitals:   07/23/13 1420  BP: 160/80  Pulse: 80     ASSESSMENT & PLAN

## 2013-08-08 ENCOUNTER — Other Ambulatory Visit: Payer: Self-pay | Admitting: *Deleted

## 2013-08-08 DIAGNOSIS — E785 Hyperlipidemia, unspecified: Secondary | ICD-10-CM

## 2013-08-08 DIAGNOSIS — M1990 Unspecified osteoarthritis, unspecified site: Secondary | ICD-10-CM

## 2013-08-08 MED ORDER — GLIMEPIRIDE 2 MG PO TABS: 2.0000 mg | ORAL_TABLET | Freq: Every day | ORAL | Status: DC

## 2013-08-08 MED ORDER — LOVASTATIN 20 MG PO TABS: 20.0000 mg | ORAL_TABLET | Freq: Every day | ORAL | Status: DC

## 2013-08-23 DIAGNOSIS — L97509 Non-pressure chronic ulcer of other part of unspecified foot with unspecified severity: Secondary | ICD-10-CM | POA: Diagnosis not present

## 2013-09-20 DIAGNOSIS — L97509 Non-pressure chronic ulcer of other part of unspecified foot with unspecified severity: Secondary | ICD-10-CM | POA: Diagnosis not present

## 2013-10-02 ENCOUNTER — Telehealth: Payer: Self-pay | Admitting: Family Medicine

## 2013-10-02 NOTE — Telephone Encounter (Signed)
appt scheduled

## 2013-10-03 ENCOUNTER — Ambulatory Visit (INDEPENDENT_AMBULATORY_CARE_PROVIDER_SITE_OTHER): Payer: Medicare Other | Admitting: Family Medicine

## 2013-10-03 ENCOUNTER — Ambulatory Visit (INDEPENDENT_AMBULATORY_CARE_PROVIDER_SITE_OTHER): Payer: Medicare Other

## 2013-10-03 ENCOUNTER — Encounter: Payer: Self-pay | Admitting: Family Medicine

## 2013-10-03 VITALS — BP 153/70 | HR 73 | Temp 96.9°F | Ht 69.0 in | Wt 197.8 lb

## 2013-10-03 DIAGNOSIS — I2510 Atherosclerotic heart disease of native coronary artery without angina pectoris: Secondary | ICD-10-CM | POA: Diagnosis not present

## 2013-10-03 DIAGNOSIS — M79609 Pain in unspecified limb: Secondary | ICD-10-CM

## 2013-10-03 DIAGNOSIS — M79671 Pain in right foot: Secondary | ICD-10-CM

## 2013-10-03 NOTE — Progress Notes (Signed)
   Subjective:    Patient ID: Hope Pigeon Mclane, male    DOB: [birth year], 80 y.o.   MRN: [id number]  HPI  This 80 y.o. male presents for evaluation of right foot discomfort.  He states he was walking and stumbled the other day and since he has had some right foot discomfort.  He c/o pain with dorsi flexion.  He has hx of lower extremity neuropathy.  Review of Systems C/o right foot pain No chest pain, SOB, HA, dizziness, vision change, N/V, diarrhea, constipation, dysuria, urinary urgency or frequency, myalgias, arthralgias or rash.     Objective:   Physical Exam  Vital signs noted  Well developed well nourished male.  HEENT - Head atraumatic Normocephalic                Eyes - PERRLA, Conjuctiva - clear Sclera- Clear EOMI                Ears - EAC's Wnl TM's Wnl Gross Hearing WNL                Throat - oropharanx wnl Respiratory - Lungs CTA bilateral Cardiac - RRR S1 and S2 without murmur GI - Abdomen soft Nontender and bowel sounds active x 4 MS - TTP right anterior foot  Xray of right foot - no fracture Prelimnary reading by Marin:  Right foot pain - Plan: DG Foot Complete Right Reassured no fracture and follow up if discomfort is not resolving.  Lysbeth Penner FNP

## 2013-11-21 ENCOUNTER — Other Ambulatory Visit: Payer: Self-pay | Admitting: Family Medicine

## 2013-11-23 NOTE — Telephone Encounter (Signed)
Last seen 10/03/13 B [place]  Last glucose 01/14/13

## 2013-11-29 DIAGNOSIS — B351 Tinea unguium: Secondary | ICD-10-CM | POA: Diagnosis not present

## 2013-11-29 DIAGNOSIS — L851 Acquired keratosis [keratoderma] palmaris et plantaris: Secondary | ICD-10-CM | POA: Diagnosis not present

## 2013-11-29 DIAGNOSIS — E1149 Type 2 diabetes mellitus with other diabetic neurological complication: Secondary | ICD-10-CM | POA: Diagnosis not present

## 2014-02-05 ENCOUNTER — Other Ambulatory Visit: Payer: Self-pay | Admitting: Family Medicine

## 2014-02-28 DIAGNOSIS — L84 Corns and callosities: Secondary | ICD-10-CM | POA: Diagnosis not present

## 2014-02-28 DIAGNOSIS — B351 Tinea unguium: Secondary | ICD-10-CM | POA: Diagnosis not present

## 2014-02-28 DIAGNOSIS — E1142 Type 2 diabetes mellitus with diabetic polyneuropathy: Secondary | ICD-10-CM | POA: Diagnosis not present

## 2014-04-16 ENCOUNTER — Other Ambulatory Visit: Payer: Self-pay | Admitting: Family Medicine

## 2014-05-15 ENCOUNTER — Other Ambulatory Visit: Payer: Self-pay | Admitting: Family Medicine

## 2014-05-21 ENCOUNTER — Telehealth: Payer: Self-pay | Admitting: Family Medicine

## 2014-05-21 MED ORDER — GLIMEPIRIDE 2 MG PO TABS: ORAL_TABLET | ORAL | Status: DC

## 2014-05-21 NOTE — Telephone Encounter (Signed)
done

## 2014-05-27 ENCOUNTER — Encounter: Payer: Self-pay | Admitting: Family Medicine

## 2014-05-27 ENCOUNTER — Ambulatory Visit (INDEPENDENT_AMBULATORY_CARE_PROVIDER_SITE_OTHER): Payer: Medicare Other | Admitting: Family Medicine

## 2014-05-27 VITALS — BP 160/61 | HR 66 | Temp 96.9°F | Ht 69.0 in | Wt 197.0 lb

## 2014-05-27 DIAGNOSIS — I10 Essential (primary) hypertension: Secondary | ICD-10-CM

## 2014-05-27 DIAGNOSIS — E119 Type 2 diabetes mellitus without complications: Secondary | ICD-10-CM | POA: Diagnosis not present

## 2014-05-27 DIAGNOSIS — E785 Hyperlipidemia, unspecified: Secondary | ICD-10-CM | POA: Diagnosis not present

## 2014-05-27 DIAGNOSIS — R5383 Other fatigue: Secondary | ICD-10-CM | POA: Diagnosis not present

## 2014-05-27 LAB — POCT CBC
Granulocyte percent: 69.3 %G (ref 37–80)
HEMATOCRIT: 46.1 % (ref 43.5–53.7)
Hemoglobin: 14.3 g/dL (ref 14.1–18.1)
Lymph, poc: 2.4 (ref 0.6–3.4)
MCH: 31.2 pg (ref 27–31.2)
MCHC: 31 g/dL — AB (ref 31.8–35.4)
MCV: 100.4 fL — AB (ref 80–97)
MPV: 8.4 fL (ref 0–99.8)
POC Granulocyte: 5.6 (ref 2–6.9)
POC LYMPH PERCENT: 29.2 %L (ref 10–50)
Platelet Count, POC: 238 10*3/uL (ref 142–424)
RBC: 4.59 M/uL — AB (ref 4.69–6.13)
RDW, POC: 13.5 %
WBC: 8.1 10*3/uL (ref 4.6–10.2)

## 2014-05-27 LAB — POCT GLYCOSYLATED HEMOGLOBIN (HGB A1C)

## 2014-05-27 MED ORDER — VALSARTAN 80 MG PO TABS: 80.0000 mg | ORAL_TABLET | Freq: Every day | ORAL | Status: DC

## 2014-05-27 NOTE — Progress Notes (Signed)
Subjective:  Patient ID: [name] [name], male    DOB: [birth year]  Age: 81 y.o. MRN: [id number]  CC: Diabetes and Hypertension   HPI [name] [name] presents for Patient in for follow-up of elevated cholesterol. Doing well without complaints on current medication. Denies side effects of statin including myalgia and arthralgia and nausea. Also in today for liver function testing. Currently no chest pain, shortness of breath or other cardiovascular related symptoms noted.  Patient in for follow-up of hypertension. Patient has no history of headache chest pain or shortness of breath or recent cough. Patient also denies symptoms of TIA such as numbness weakness lateralizing. Patient checks  blood pressure at home and has not had any elevated readings recently. Patient denies side effects from his medication. States taking it regularly.   Patient does not check blood sugar at home Patient denies symptoms such as polyuria, polydipsia, excessive hunger, nausea No significant hypoglycemic spells noted. Medications as noted below. Taking them regularly without complication/adverse reaction being reported today.  History [name] [name] has a past medical history of HTN (hypertension); Hyperlipidemia; CAD (coronary artery disease) (2002); Diabetic neuropathy; Respiratory failure; Ejection fraction; CABG; Carotid artery disease; and Shortness of breath.   He has past surgical history that includes Coronary artery bypass graft; Knee Arthroplasty; Carpal tunnel release; and laparoscopic appendectomy (N/A, 01/10/2013).   His family history includes Heart attack (age of onset: 60) in his father; Heart disease in his father; Heart failure (age of onset: 29) in his mother.He reports that he has never smoked. He does not have any smokeless tobacco history on file. He reports that he does not drink alcohol or use illicit drugs.  Current Outpatient Prescriptions on File Prior to Visit  Medication Sig Dispense Refill  .  aspirin 81 MG tablet Take 81 mg by mouth daily.    Marland Kitchen etodolac (LODINE) 200 MG capsule Take 400 mg by mouth. 400 MG Monday and Thursdays    . glimepiride (AMARYL) 2 MG tablet TAKE 1 TABLET (2 MG TOTAL) BY MOUTH DAILY BEFORE BREAKFAST. 30 tablet 0  . lovastatin (MEVACOR) 20 MG tablet Take 1 tablet (20 mg total) by mouth at bedtime. 90 tablet 0  . metoprolol succinate (TOPROL-XL) 50 MG 24 hr tablet Take 1 tablet (50 mg total) by mouth 2 (two) times daily. Take with or immediately following a meal. 180 tablet 3  . Multiple Vitamin (MULTIVITAMIN) tablet Take 1 tablet by mouth daily.    Marland Kitchen omeprazole (PRILOSEC) 20 MG capsule Take 20 mg by mouth. Given on Mondays and Thurs     No current facility-administered medications on file prior to visit.    ROS Review of Systems  Constitutional: Negative for fever, chills, diaphoresis and unexpected weight change.  HENT: Negative for congestion, hearing loss, rhinorrhea, sore throat and trouble swallowing.   Respiratory: Negative for cough, chest tightness, shortness of breath and wheezing.   Gastrointestinal: Negative for nausea, vomiting, abdominal pain, diarrhea, constipation and abdominal distention.  Endocrine: Negative for cold intolerance and heat intolerance.  Genitourinary: Negative for dysuria, hematuria and flank pain.  Musculoskeletal: Negative for joint swelling and arthralgias.  Skin: Negative for rash.  Neurological: Negative for dizziness and headaches.  Psychiatric/Behavioral: Negative for dysphoric mood, decreased concentration and agitation. The patient is not nervous/anxious.     Objective:  BP 160/61 mmHg  Pulse 66  Temp(Src) 96.9 F (36.1 C) (Oral)  Ht 5' 9"  (1.753 m)  Wt 197 lb (89.359 kg)  BMI 29.08 kg/m2  BP Readings  from Last 3 Encounters:  05/27/14 160/61  10/03/13 153/70  07/23/13 160/80    Wt Readings from Last 3 Encounters:  05/27/14 197 lb (89.359 kg)  10/03/13 197 lb 12.8 oz (89.721 kg)  01/10/13 216 lb 9.6 oz  (98.249 kg)     Physical Exam  Constitutional: He is oriented to person, place, and time. He appears well-developed and well-nourished. No distress.  HENT:  Head: Normocephalic and atraumatic.  Right Ear: External ear normal.  Left Ear: External ear normal.  Nose: Nose normal.  Mouth/Throat: Oropharynx is clear and moist.  Eyes: Conjunctivae and EOM are normal. Pupils are equal, round, and reactive to light.  Neck: Normal range of motion. Neck supple. No thyromegaly present.  Cardiovascular: Normal rate, regular rhythm and normal heart sounds.   No murmur heard. Pulmonary/Chest: Effort normal and breath sounds normal. No respiratory distress. He has no wheezes. He has no rales.  Abdominal: Soft. Bowel sounds are normal. He exhibits no distension. There is no tenderness.  Lymphadenopathy:    He has no cervical adenopathy.  Neurological: He is alert and oriented to person, place, and time. He has normal reflexes.  Skin: Skin is warm and dry.  Psychiatric: He has a normal mood and affect. His behavior is normal. Judgment and thought content normal.    Lab Results  Component Value Date   HGBA1C 6.4% 05/27/2014   HGBA1C 6.6 01/01/2013    Lab Results  Component Value Date   WBC 8.1 05/27/2014   HGB 14.3 05/27/2014   HCT 46.1 05/27/2014   PLT 265 01/14/2013   GLUCOSE 149* 01/14/2013   CHOL 130 01/01/2013   TRIG 309* 01/01/2013   HDL 33* 01/01/2013   LDLCALC 35 01/01/2013   ALT 49* 01/01/2013   AST 30 01/01/2013   NA 138 01/14/2013   K 3.7 01/14/2013   CL 100 01/14/2013   CREATININE 1.10 01/14/2013   BUN 15 01/14/2013   CO2 28 01/14/2013   INR 1.19 01/10/2013   HGBA1C 6.4% 05/27/2014    Ct Abdomen Pelvis W Contrast  01/10/2013   CLINICAL DATA:  right-sided abdominal pain with low-grade fever  EXAM: CT ABDOMEN AND PELVIS WITH CONTRAST  TECHNIQUE: Multidetector CT imaging of the abdomen and pelvis was performed using the standard protocol following bolus administration  of intravenous contrast. Oral contrast was also administered.  CONTRAST:  195m OMNIPAQUE IOHEXOL 300 MG/ML  SOLN  COMPARISON:  None.  FINDINGS: There is scarring in the anterior lung base. There is extensive coronary artery calcification.  There is fatty change in the liver. No focal liver lesions are identified. There is no biliary duct dilatation. Gallbladder wall is not thickened.  The spleen, pancreas, and adrenals appear normal. The kidneys bilaterally show no mass or hydronephrosis on either side.  In the pelvis, the appendix is dilated with extensive surrounding inflammation consistent with acute appendicitis. There is no well-defined abscess. Elsewhere in the pelvis, there is no mass or fluid collection. Prostate is mildly enlarged.  There is no bowel obstruction. No free air or portal venous air.  There is no ascites, adenopathy, or abscess in the abdomen or pelvis.  There is extensive atherosclerotic change in the aorta but no aneurysm. There is extensive arthropathy throughout the lumbar spine. There are no blastic or lytic bone lesions.  IMPRESSION: Evidence of acute appendicitis. No frank abscess. No bowel obstruction.  Fatty liver.  Extensive lumbar arthropathy. Extensive atherosclerotic change.  Critical Value/emergent results were called by telephone at the time  of interpretation on 01/10/2013 at 4:47 PM to Dr.Donald Laurance Flatten , who verbally acknowledged these results.   Electronically Signed   By: Lowella Grip M.D.   On: 01/10/2013 16:53   Dg Chest Port 1 View  01/10/2013   CLINICAL DATA:  Initial encounter for current episode of shortness of breath. Current history of coronary artery disease, prior CABG. Current history of hypertension, diabetes, and hyperlipidemia.  EXAM: PORTABLE CHEST - 1 VIEW  COMPARISON:  Two-view chest x-ray 04/02/2009 and portable chest x-rays 07/12/2006 dating back to 06/30/2006.  FINDINGS: Prior sternotomy for CABG. Cardiac silhouette mildly to moderately enlarged  but stable, allowing for differences in technique. Pulmonary vascularity normal without evidence of pulmonary edema. Mildly prominent bronchovascular markings diffusely and mild to moderate central peribronchial thickening, more so than on the prior examination from 03/2009. No confluent airspace consolidation. No pleural effusions. No pneumothorax.  IMPRESSION: Mild to moderate changes of acute bronchitis and/or asthma without localized airspace pneumonia. Stable mild to moderate cardiomegaly without pulmonary edema.   Electronically Signed   By: Evangeline Dakin M.D.   On: 01/10/2013 17:38    Assessment & Plan:   Manpreet was seen today for diabetes and hypertension.  Diagnoses and all orders for this visit:  Diabetes mellitus without complication Orders: -     POCT glycosylated hemoglobin (Hb A1C) -     POCT UA - Microalbumin -     CMP14+EGFR  Essential hypertension Orders: -     POCT CBC  Hyperlipidemia Orders: -     Lipid panel  Other fatigue Orders: -     Thyroid Panel With TSH -     Walker standard  Other orders -     valsartan (DIOVAN) 80 MG tablet; Take 1 tablet (80 mg total) by mouth daily.   I am having [name] [name] start on valsartan. I am also having him maintain his multivitamin, aspirin, omeprazole, etodolac, metoprolol succinate, lovastatin, and glimepiride.  Meds ordered this encounter  Medications  . valsartan (DIOVAN) 80 MG tablet    Sig: Take 1 tablet (80 mg total) by mouth daily.    Dispense:  30 tablet    Refill:  2    Comments: Patient was encouraged to check his blood sugar periodically fasting and postprandial. He is to record those on the log sheet given today and bring those with him to his follow-up  Follow-up: Return in about 1 month (around 06/25/2014).  Claretta Fraise, M.D.

## 2014-05-28 ENCOUNTER — Other Ambulatory Visit: Payer: Self-pay | Admitting: Family Medicine

## 2014-05-28 LAB — CMP14+EGFR
A/G RATIO: 1.5 (ref 1.1–2.5)
ALBUMIN: 4 g/dL (ref 3.2–4.6)
ALK PHOS: 58 IU/L (ref 39–117)
ALT: 32 IU/L (ref 0–44)
AST: 29 IU/L (ref 0–40)
BILIRUBIN TOTAL: 0.4 mg/dL (ref 0.0–1.2)
BUN / CREAT RATIO: 12 (ref 10–22)
BUN: 17 mg/dL (ref 10–36)
CO2: 23 mmol/L (ref 18–29)
Calcium: 9.5 mg/dL (ref 8.6–10.2)
Chloride: 103 mmol/L (ref 97–108)
Creatinine, Ser: 1.38 mg/dL — ABNORMAL HIGH (ref 0.76–1.27)
GFR, EST AFRICAN AMERICAN: 51 mL/min/{1.73_m2} — AB (ref >59)
GFR, EST NON AFRICAN AMERICAN: 44 mL/min/{1.73_m2} — AB (ref >59)
GLUCOSE: 116 mg/dL — AB (ref 65–99)
Globulin, Total: 2.6 g/dL (ref 1.5–4.5)
Potassium: 5 mmol/L (ref 3.5–5.2)
Sodium: 141 mmol/L (ref 134–144)
Total Protein: 6.6 g/dL (ref 6.0–8.5)

## 2014-05-28 LAB — THYROID PANEL WITH TSH
FREE THYROXINE INDEX: 2 (ref 1.2–4.9)
T3 UPTAKE RATIO: 32 % (ref 24–39)
T4, Total: 6.1 ug/dL (ref 4.5–12.0)
TSH: 6.21 u[IU]/mL — ABNORMAL HIGH (ref 0.450–4.500)

## 2014-05-28 LAB — LIPID PANEL
Chol/HDL Ratio: 4.7 ratio units (ref 0.0–5.0)
Cholesterol, Total: 150 mg/dL (ref 100–199)
HDL: 32 mg/dL — ABNORMAL LOW (ref >39)
LDL Calculated: 53 mg/dL (ref 0–99)
Triglycerides: 327 mg/dL — ABNORMAL HIGH (ref 0–149)
VLDL Cholesterol Cal: 65 mg/dL — ABNORMAL HIGH (ref 5–40)

## 2014-05-28 MED ORDER — LEVOTHYROXINE SODIUM 50 MCG PO TABS: 50.0000 ug | ORAL_TABLET | Freq: Every day | ORAL | Status: DC

## 2014-05-29 ENCOUNTER — Telehealth: Payer: Self-pay | Admitting: *Deleted

## 2014-05-29 NOTE — Telephone Encounter (Signed)
-----   Message from Claretta Fraise, MD sent at 05/28/2014  1:59 PM EST ----- [name] [name], Overall your blood work looked pretty good. However, there is a significant underactivity in the thyroid gland. This is very likely secondary to age. A thyroid supplement is important for your health. I have sent a prescription to your drugstore. Let's recheck it in about 6 weeks. The mild abnormalities in your cholesterol panel may well improve after you get on your thyroid supplement. Overall, however, the cholesterol was good. The kidneys show some weakening. This is again age-related. For now increasing water intake should help. This should also be monitored every few months. The most common symptom if it becomes a problem is significant swelling in the feet and ankles Best Regards, Claretta Fraise, M.D.

## 2014-05-29 NOTE — Telephone Encounter (Signed)
Pt notified of results Verbalizes understanding 

## 2014-06-24 ENCOUNTER — Other Ambulatory Visit: Payer: Self-pay | Admitting: Family Medicine

## 2014-06-27 ENCOUNTER — Encounter: Payer: Self-pay | Admitting: Family Medicine

## 2014-06-27 ENCOUNTER — Ambulatory Visit (INDEPENDENT_AMBULATORY_CARE_PROVIDER_SITE_OTHER): Payer: Medicare Other | Admitting: Family Medicine

## 2014-06-27 VITALS — BP 122/63 | HR 78 | Temp 98.9°F | Ht 69.0 in | Wt 195.0 lb

## 2014-06-27 DIAGNOSIS — E785 Hyperlipidemia, unspecified: Secondary | ICD-10-CM

## 2014-06-27 DIAGNOSIS — I10 Essential (primary) hypertension: Secondary | ICD-10-CM

## 2014-06-27 MED ORDER — VALSARTAN 80 MG PO TABS: 80.0000 mg | ORAL_TABLET | Freq: Every day | ORAL | Status: DC

## 2014-06-27 MED ORDER — BLOOD GLUCOSE MONITOR KIT: PACK | Status: DC

## 2014-06-27 MED ORDER — LOVASTATIN 20 MG PO TABS: 20.0000 mg | ORAL_TABLET | Freq: Every day | ORAL | Status: DC

## 2014-06-27 MED ORDER — GLIMEPIRIDE 2 MG PO TABS: ORAL_TABLET | ORAL | Status: DC

## 2014-06-27 MED ORDER — METOPROLOL SUCCINATE ER 50 MG PO TB24: 50.0000 mg | ORAL_TABLET | Freq: Two times a day (BID) | ORAL | Status: DC

## 2014-06-27 NOTE — Patient Instructions (Signed)
Check glucose daily before breakfast and again 2 hours after supper

## 2014-06-27 NOTE — Progress Notes (Signed)
Subjective:  Patient ID: [name] [name], male    DOB: [birth year]  Age: 81 y.o. MRN: [id number]  CC: Hypertension   HPI [name] [name] presents for  follow-up of hypertension. Patient has no history of headache chest pain or shortness of breath or recent cough. Patient also denies symptoms of TIA such as numbness weakness lateralizing. Patient checks  blood pressure at home and has not had any elevated readings recently. Patient denies side effects from his medication. States taking it regularly.  Patient in for follow-up of elevated cholesterol. Doing well without complaints on current medication. Denies side effects of statin including myalgia and arthralgia and nausea. Also in today for liver function testing. Currently no chest pain, shortness of breath or other cardiovascular related symptoms noted.  History [name] [name] has a past medical history of HTN (hypertension); Hyperlipidemia; CAD (coronary artery disease) (2002); Diabetic neuropathy; Respiratory failure; Ejection fraction; CABG; Carotid artery disease; and Shortness of breath.   He has past surgical history that includes Coronary artery bypass graft; Knee Arthroplasty; Carpal tunnel release; and laparoscopic appendectomy (N/A, 01/10/2013).   His family history includes Heart attack (age of onset: 29) in his father; Heart disease in his father; Heart failure (age of onset: 30) in his mother.He reports that he has never smoked. He does not have any smokeless tobacco history on file. He reports that he does not drink alcohol or use illicit drugs.  Current Outpatient Prescriptions on File Prior to Visit  Medication Sig Dispense Refill  . aspirin 81 MG tablet Take 81 mg by mouth daily.    Marland Kitchen etodolac (LODINE) 200 MG capsule Take 400 mg by mouth. 400 MG Monday and Thursdays    . levothyroxine (SYNTHROID, LEVOTHROID) 50 MCG tablet Take 1 tablet (50 mcg total) by mouth daily. 90 tablet 3  . Multiple Vitamin (MULTIVITAMIN) tablet Take 1 tablet  by mouth daily.    Marland Kitchen omeprazole (PRILOSEC) 20 MG capsule Take 20 mg by mouth. Given on Mondays and Thurs     No current facility-administered medications on file prior to visit.    ROS Review of Systems  Constitutional: Negative for fever, chills, diaphoresis and unexpected weight change.  HENT: Negative for congestion, hearing loss, rhinorrhea, sore throat and trouble swallowing.   Respiratory: Negative for cough, chest tightness, shortness of breath and wheezing.   Gastrointestinal: Negative for nausea, vomiting, abdominal pain, diarrhea, constipation and abdominal distention.  Endocrine: Negative for cold intolerance and heat intolerance.  Genitourinary: Negative for dysuria, hematuria and flank pain.  Musculoskeletal: Negative for joint swelling and arthralgias.  Skin: Negative for rash.  Neurological: Negative for dizziness and headaches.  Psychiatric/Behavioral: Negative for dysphoric mood, decreased concentration and agitation. The patient is not nervous/anxious.     Objective:  BP 122/63 mmHg  Pulse 78  Temp(Src) 98.9 F (37.2 C) (Oral)  Ht 5' 9"  (1.753 m)  Wt 195 lb (88.451 kg)  BMI 28.78 kg/m2  BP Readings from Last 3 Encounters:  06/27/14 122/63  05/27/14 160/61  10/03/13 153/70    Wt Readings from Last 3 Encounters:  06/27/14 195 lb (88.451 kg)  05/27/14 197 lb (89.359 kg)  10/03/13 197 lb 12.8 oz (89.721 kg)     Physical Exam  Constitutional: He is oriented to person, place, and time. He appears well-developed and well-nourished. No distress.  HENT:  Head: Normocephalic and atraumatic.  Right Ear: External ear normal.  Left Ear: External ear normal.  Nose: Nose normal.  Mouth/Throat: Oropharynx is clear and moist.  Eyes: Conjunctivae and EOM are normal. Pupils are equal, round, and reactive to light.  Neck: Normal range of motion. Neck supple. No thyromegaly present.  Cardiovascular: Normal rate, regular rhythm and normal heart sounds.   No murmur  heard. Pulmonary/Chest: Effort normal and breath sounds normal. No respiratory distress. He has no wheezes. He has no rales.  Abdominal: Soft. Bowel sounds are normal. He exhibits no distension. There is no tenderness.  Lymphadenopathy:    He has no cervical adenopathy.  Neurological: He is alert and oriented to person, place, and time. He has normal reflexes.  Skin: Skin is warm and dry.  Psychiatric: He has a normal mood and affect. His behavior is normal. Judgment and thought content normal.    Lab Results  Component Value Date   HGBA1C 6.4% 05/27/2014   HGBA1C 6.6 01/01/2013    Lab Results  Component Value Date   WBC 8.1 05/27/2014   HGB 14.3 05/27/2014   HCT 46.1 05/27/2014   PLT 265 01/14/2013   GLUCOSE 116* 05/27/2014   CHOL 150 05/27/2014   TRIG 327* 05/27/2014   HDL 32* 05/27/2014   LDLCALC 53 05/27/2014   ALT 32 05/27/2014   AST 29 05/27/2014   NA 141 05/27/2014   K 5.0 05/27/2014   CL 103 05/27/2014   CREATININE 1.38* 05/27/2014   BUN 17 05/27/2014   CO2 23 05/27/2014   TSH 6.210* 05/27/2014   INR 1.19 01/10/2013   HGBA1C 6.4% 05/27/2014    Ct Abdomen Pelvis W Contrast  01/10/2013   CLINICAL DATA:  right-sided abdominal pain with low-grade fever  EXAM: CT ABDOMEN AND PELVIS WITH CONTRAST  TECHNIQUE: Multidetector CT imaging of the abdomen and pelvis was performed using the standard protocol following bolus administration of intravenous contrast. Oral contrast was also administered.  CONTRAST:  138m OMNIPAQUE IOHEXOL 300 MG/ML  SOLN  COMPARISON:  None.  FINDINGS: There is scarring in the anterior lung base. There is extensive coronary artery calcification.  There is fatty change in the liver. No focal liver lesions are identified. There is no biliary duct dilatation. Gallbladder wall is not thickened.  The spleen, pancreas, and adrenals appear normal. The kidneys bilaterally show no mass or hydronephrosis on either side.  In the pelvis, the appendix is dilated  with extensive surrounding inflammation consistent with acute appendicitis. There is no well-defined abscess. Elsewhere in the pelvis, there is no mass or fluid collection. Prostate is mildly enlarged.  There is no bowel obstruction. No free air or portal venous air.  There is no ascites, adenopathy, or abscess in the abdomen or pelvis.  There is extensive atherosclerotic change in the aorta but no aneurysm. There is extensive arthropathy throughout the lumbar spine. There are no blastic or lytic bone lesions.  IMPRESSION: Evidence of acute appendicitis. No frank abscess. No bowel obstruction.  Fatty liver.  Extensive lumbar arthropathy. Extensive atherosclerotic change.  Critical Value/emergent results were called by telephone at the time of interpretation on 01/10/2013 at 4:47 PM to Dr.Donald MLaurance Flatten, who verbally acknowledged these results.   Electronically Signed   By: WLowella GripM.D.   On: 01/10/2013 16:53   Dg Chest Port 1 View  01/10/2013   CLINICAL DATA:  Initial encounter for current episode of shortness of breath. Current history of coronary artery disease, prior CABG. Current history of hypertension, diabetes, and hyperlipidemia.  EXAM: PORTABLE CHEST - 1 VIEW  COMPARISON:  Two-view chest x-ray 04/02/2009 and portable chest x-rays 07/12/2006 dating back to 06/30/2006.  FINDINGS: Prior sternotomy for CABG. Cardiac silhouette mildly to moderately enlarged but stable, allowing for differences in technique. Pulmonary vascularity normal without evidence of pulmonary edema. Mildly prominent bronchovascular markings diffusely and mild to moderate central peribronchial thickening, more so than on the prior examination from 03/2009. No confluent airspace consolidation. No pleural effusions. No pneumothorax.  IMPRESSION: Mild to moderate changes of acute bronchitis and/or asthma without localized airspace pneumonia. Stable mild to moderate cardiomegaly without pulmonary edema.   Electronically Signed    By: Evangeline Dakin M.D.   On: 01/10/2013 17:38    Assessment & Plan:   [name] [name] was seen today for hypertension.  Diagnoses and all orders for this visit:  Hyperlipemia Orders: -     lovastatin (MEVACOR) 20 MG tablet; Take 1 tablet (20 mg total) by mouth at bedtime. -     TSH; Future -     T4, free; Future  Essential hypertension  Other orders -     glimepiride (AMARYL) 2 MG tablet; TAKE 1 TABLET (2 MG TOTAL) BY MOUTH DAILY BEFORE BREAKFAST. -     metoprolol succinate (TOPROL-XL) 50 MG 24 hr tablet; Take 1 tablet (50 mg total) by mouth 2 (two) times daily. Take with or immediately following a meal. -     valsartan (DIOVAN) 80 MG tablet; Take 1 tablet (80 mg total) by mouth daily. -     blood glucose meter kit and supplies KIT; Dispense based on patient and insurance preference. Use up to four times daily as directed. (FOR ICD-9 250.00, 250.01).   I am having [name] [name] start on blood glucose meter kit and supplies. I am also having him maintain his multivitamin, aspirin, omeprazole, etodolac, levothyroxine, glimepiride, lovastatin, metoprolol succinate, and valsartan.  Meds ordered this encounter  Medications  . glimepiride (AMARYL) 2 MG tablet    Sig: TAKE 1 TABLET (2 MG TOTAL) BY MOUTH DAILY BEFORE BREAKFAST.    Dispense:  30 tablet    Refill:  2    Please don't fill until pt requests  . lovastatin (MEVACOR) 20 MG tablet    Sig: Take 1 tablet (20 mg total) by mouth at bedtime.    Dispense:  90 tablet    Refill:  3    Please don't fill until pt requests  . metoprolol succinate (TOPROL-XL) 50 MG 24 hr tablet    Sig: Take 1 tablet (50 mg total) by mouth 2 (two) times daily. Take with or immediately following a meal.    Dispense:  180 tablet    Refill:  3    Please don't fill until pt requests  . valsartan (DIOVAN) 80 MG tablet    Sig: Take 1 tablet (80 mg total) by mouth daily.    Dispense:  30 tablet    Refill:  2    Please don't fill until pt requests  . blood glucose  meter kit and supplies KIT    Sig: Dispense based on patient and insurance preference. Use up to four times daily as directed. (FOR ICD-9 250.00, 250.01).    Dispense:  1 each    Refill:  0    Order Specific Question:  Number of strips    Answer:  100    Order Specific Question:  Number of lancets    Answer:  100     Follow-up: Return in about 2 months (around 08/27/2014).  Claretta Fraise, M.D. d

## 2014-07-01 ENCOUNTER — Telehealth: Payer: Self-pay | Admitting: Family Medicine

## 2014-07-01 MED ORDER — BLOOD GLUCOSE MONITOR KIT: PACK | Status: DC

## 2014-07-01 NOTE — Telephone Encounter (Signed)
done

## 2014-07-11 DIAGNOSIS — B351 Tinea unguium: Secondary | ICD-10-CM | POA: Diagnosis not present

## 2014-07-11 DIAGNOSIS — L84 Corns and callosities: Secondary | ICD-10-CM | POA: Diagnosis not present

## 2014-07-11 DIAGNOSIS — E1142 Type 2 diabetes mellitus with diabetic polyneuropathy: Secondary | ICD-10-CM | POA: Diagnosis not present

## 2014-07-26 ENCOUNTER — Encounter: Payer: Self-pay | Admitting: Cardiology

## 2014-07-26 ENCOUNTER — Ambulatory Visit (INDEPENDENT_AMBULATORY_CARE_PROVIDER_SITE_OTHER): Payer: Medicare Other | Admitting: Cardiology

## 2014-07-26 VITALS — BP 124/66 | HR 53 | Ht 69.0 in | Wt 196.2 lb

## 2014-07-26 DIAGNOSIS — I739 Peripheral vascular disease, unspecified: Secondary | ICD-10-CM

## 2014-07-26 DIAGNOSIS — I2510 Atherosclerotic heart disease of native coronary artery without angina pectoris: Secondary | ICD-10-CM | POA: Diagnosis not present

## 2014-07-26 DIAGNOSIS — E038 Other specified hypothyroidism: Secondary | ICD-10-CM

## 2014-07-26 DIAGNOSIS — E785 Hyperlipidemia, unspecified: Secondary | ICD-10-CM

## 2014-07-26 DIAGNOSIS — E039 Hypothyroidism, unspecified: Secondary | ICD-10-CM

## 2014-07-26 DIAGNOSIS — I779 Disorder of arteries and arterioles, unspecified: Secondary | ICD-10-CM | POA: Diagnosis not present

## 2014-07-26 DIAGNOSIS — I10 Essential (primary) hypertension: Secondary | ICD-10-CM

## 2014-07-26 HISTORY — DX: Hypothyroidism, unspecified: E03.9

## 2014-07-26 MED ORDER — METOPROLOL SUCCINATE ER 50 MG PO TB24: 50.0000 mg | ORAL_TABLET | Freq: Two times a day (BID) | ORAL | Status: DC

## 2014-07-26 MED ORDER — VALSARTAN 80 MG PO TABS
80.0000 mg | ORAL_TABLET | Freq: Every day | ORAL | Status: DC
Start: 2014-07-26 — End: 2016-01-14

## 2014-07-26 MED ORDER — LOVASTATIN 20 MG PO TABS: 20.0000 mg | ORAL_TABLET | Freq: Every day | ORAL | Status: DC

## 2014-07-26 NOTE — Progress Notes (Signed)
Cardiology Office Note   Date:  07/26/2014   ID:  [name] [name], DOB July 21, 1918, MRN [id number]  PCP:  [name] Fraise, MD  Cardiologist:  [name] Argyle, MD   Chief Complaint  Patient presents with  . Appointment    Follow-up coronary artery disease      History of Present Illness: [name] [name] is a 81 y.o. male who presents today for the follow-up of coronary artery disease. He is quite stable. He can still drive. He's here with his wife and his daughter. I take care of both of them. They will follow-up in the future with Dr. Percival Spanish in the [place] office. He is a delightful gentleman. He is not having any chest pain or shortness of breath.    Past Medical History  Diagnosis Date  . HTN (hypertension)   . Hyperlipidemia   . CAD (coronary artery disease) 2002    Nuclear, January, 2011, no ischemia  . Diabetic neuropathy     Possible  . Respiratory failure     vent dependent...acquired pneumonia...2008  . Ejection fraction     EF 50-55%, echo, January, 2011, hypokinesis mid--base inferolateral  . Hx of CABG     1998  . Carotid artery disease     Doppler, February, 2012, 0-39% bilateral  . Shortness of breath     2011    Past Surgical History  Procedure Laterality Date  . Coronary artery bypass graft    . Knee arthroplasty      total  . Carpal tunnel release    . Laparoscopic appendectomy N/A 01/10/2013    Procedure: APPENDECTOMY LAPAROSCOPIC;  Surgeon: Donato Heinz, MD;  Location: [facility];  Service: General;  Laterality: N/A;    Patient Active Problem List   Diagnosis Date Noted  . HTN (hypertension)   . Hyperlipidemia   . CAD (coronary artery disease)   . Diabetic neuropathy   . Hx of CABG   . Carotid artery disease   . Ejection fraction   . Shortness of breath   . Diabetes mellitus without complication 46/27/0350      Current Outpatient Prescriptions  Medication Sig Dispense Refill  . aspirin 81 MG tablet Take 81 mg by mouth daily.    . blood  glucose meter kit and supplies KIT Dispense based on patient and insurance preference. Use up to four times daily as directed. Dx E11.9 1 each 0  . etodolac (LODINE) 200 MG capsule Take 400 mg by mouth. 400 MG Monday and Thursdays    . glimepiride (AMARYL) 2 MG tablet TAKE 1 TABLET (2 MG TOTAL) BY MOUTH DAILY BEFORE BREAKFAST. 30 tablet 2  . levothyroxine (SYNTHROID, LEVOTHROID) 50 MCG tablet Take 1 tablet (50 mcg total) by mouth daily. 90 tablet 3  . lovastatin (MEVACOR) 20 MG tablet Take 1 tablet (20 mg total) by mouth at bedtime. 90 tablet 3  . metoprolol succinate (TOPROL-XL) 50 MG 24 hr tablet Take 1 tablet (50 mg total) by mouth 2 (two) times daily. Take with or immediately following a meal. 180 tablet 3  . Multiple Vitamin (MULTIVITAMIN) tablet Take 1 tablet by mouth daily.    Marland Kitchen omeprazole (PRILOSEC) 20 MG capsule Take 20 mg by mouth. Given on Mondays and Thurs    . valsartan (DIOVAN) 80 MG tablet Take 1 tablet (80 mg total) by mouth daily. 90 tablet 3   No current facility-administered medications for this visit.    Allergies:   Review of patient's allergies indicates no known  allergies.    Social History:  The patient  reports that he has never smoked. He does not have any smokeless tobacco history on file. He reports that he does not drink alcohol or use illicit drugs.   Family History:  The patient's family history includes Heart attack (age of onset: 33) in his father; Heart disease in his father; Heart failure (age of onset: 65) in his mother.    ROS:  Please see the history of present illness.     Patient denies fever, chills, headache, sweats, rash, change in vision, change in hearing, chest pain, cough, nausea or vomiting, urinary symptoms. All other systems are reviewed and are negative.   PHYSICAL EXAM: VS:  BP 124/66 mmHg  Pulse 53  Ht 5' 9"  (1.753 m)  Wt 196 lb 3.2 oz (88.996 kg)  BMI 28.96 kg/m2 , Patient looks quite good. Head is atraumatic. Sclera and conjunctiva  are normal. He is oriented to person time and place. Affect is normal. He is here with his wife and his daughter. There is no jugular venous distention. Lungs are clear. Respiratory effort is not labored. Cardiac exam reveals S1 and S2. The abdomen is soft. There is no peripheral edema. There are no skin rashes.  EKG:   EKG is done today and reviewed by me. There is sinus bradycardia with nonspecific ST-T wave changes.   Recent Labs: 05/27/2014: ALT 32; BUN 17; Creatinine 1.38*; Hemoglobin 14.3; Potassium 5.0; Sodium 141; TSH 6.210*    Lipid Panel    Component Value Date/Time   CHOL 150 05/27/2014 1704   TRIG 327* 05/27/2014 1704   HDL 32* 05/27/2014 1704   CHOLHDL 4.7 05/27/2014 1704   LDLCALC 53 05/27/2014 1704      Wt Readings from Last 3 Encounters:  07/26/14 196 lb 3.2 oz (88.996 kg)  06/27/14 195 lb (88.451 kg)  05/27/14 197 lb (89.359 kg)      Current medicines are reviewed  The patient's daughter understands his medications.     ASSESSMENT AND PLAN:

## 2014-07-26 NOTE — Assessment & Plan Note (Signed)
He had slight carotid artery disease in the past. No further workup.

## 2014-07-26 NOTE — Assessment & Plan Note (Signed)
Coronary disease is stable. The patient had a nuclear study in 2011 with no ischemia. No further workup is needed.

## 2014-07-26 NOTE — Patient Instructions (Signed)
Medication Instructions:  None  Labwork: None  Testing/Procedures: None  Follow-Up: Your physician wants you to follow-up in: 1 year. You will receive a reminder letter in the mail two months in advance. If you don't receive a letter, please call our office to schedule the follow-up appointment.

## 2014-07-26 NOTE — Assessment & Plan Note (Signed)
Patient has been started on thyroid replacement medication this year.

## 2014-07-26 NOTE — Assessment & Plan Note (Signed)
Blood pressures controlled. No change in therapy. 

## 2014-09-04 ENCOUNTER — Ambulatory Visit (INDEPENDENT_AMBULATORY_CARE_PROVIDER_SITE_OTHER): Payer: Medicare Other | Admitting: Family Medicine

## 2014-09-04 ENCOUNTER — Encounter: Payer: Self-pay | Admitting: Family Medicine

## 2014-09-04 VITALS — BP 153/63 | HR 58 | Temp 96.7°F | Ht 69.0 in | Wt 197.2 lb

## 2014-09-04 DIAGNOSIS — E119 Type 2 diabetes mellitus without complications: Secondary | ICD-10-CM | POA: Diagnosis not present

## 2014-09-04 DIAGNOSIS — I2510 Atherosclerotic heart disease of native coronary artery without angina pectoris: Secondary | ICD-10-CM | POA: Diagnosis not present

## 2014-09-04 DIAGNOSIS — I10 Essential (primary) hypertension: Secondary | ICD-10-CM

## 2014-09-04 LAB — POCT GLYCOSYLATED HEMOGLOBIN (HGB A1C): HEMOGLOBIN A1C: 6.6

## 2014-09-04 MED ORDER — METOPROLOL SUCCINATE ER 100 MG PO TB24: 100.0000 mg | ORAL_TABLET | Freq: Every day | ORAL | Status: DC

## 2014-09-04 NOTE — Progress Notes (Signed)
Subjective:  Patient ID: [name] [name], male    DOB: [birth year]  Age: 81 y.o. MRN: [id number]  CC: Hypertension and Diabetes   HPI Yousif Edelson Booton presents for  follow-up of hypertension. Patient has no history of headache chest pain or shortness of breath or recent cough. Patient also denies symptoms of TIA such as numbness weakness lateralizing. Patient checks  blood pressure at home and has not had any elevated readings recently. Patient denies side effects from his medication. States taking it regularly.  Patient also  in for follow-up of elevated cholesterol. Doing well without complaints on current medication. Denies side effects of statin including myalgia and arthralgia and nausea. Also in today for liver function testing. Currently no chest pain, shortness of breath or other cardiovascular related symptoms noted.  Follow-up of diabetes. Patient does not routinely check blood sugar at home. Patient denies symptoms such as polyuria, polydipsia, excessive hunger, nausea No significant hypoglycemic spells noted. Medications as noted below. Taking them regularly without complication/adverse reaction being reported today.    History Puneet has a past medical history of HTN (hypertension); Hyperlipidemia; CAD (coronary artery disease) (2002); Diabetic neuropathy; Respiratory failure; Ejection fraction; CABG; Carotid artery disease; and Shortness of breath.   He has past surgical history that includes Coronary artery bypass graft; Knee Arthroplasty; Carpal tunnel release; and laparoscopic appendectomy (N/A, 01/10/2013).   His family history includes Heart attack (age of onset: 56) in his father; Heart disease in his father; Heart failure (age of onset: 41) in his mother.He reports that he has never smoked. He does not have any smokeless tobacco history on file. He reports that he does not drink alcohol or use illicit drugs.  Current Outpatient Prescriptions on File Prior to Visit    Medication Sig Dispense Refill  . aspirin 81 MG tablet Take 81 mg by mouth daily.    . blood glucose meter kit and supplies KIT Dispense based on patient and insurance preference. Use up to four times daily as directed. Dx E11.9 1 each 0  . etodolac (LODINE) 200 MG capsule Take 400 mg by mouth. 400 MG Monday and Thursdays    . glimepiride (AMARYL) 2 MG tablet TAKE 1 TABLET (2 MG TOTAL) BY MOUTH DAILY BEFORE BREAKFAST. 30 tablet 2  . levothyroxine (SYNTHROID, LEVOTHROID) 50 MCG tablet Take 1 tablet (50 mcg total) by mouth daily. 90 tablet 3  . lovastatin (MEVACOR) 20 MG tablet Take 1 tablet (20 mg total) by mouth at bedtime. 90 tablet 3  . Multiple Vitamin (MULTIVITAMIN) tablet Take 1 tablet by mouth daily.    Marland Kitchen omeprazole (PRILOSEC) 20 MG capsule Take 20 mg by mouth. Given on Mondays and Thurs    . valsartan (DIOVAN) 80 MG tablet Take 1 tablet (80 mg total) by mouth daily. 90 tablet 3   No current facility-administered medications on file prior to visit.    ROS Review of Systems  Objective:  BP 153/63 mmHg  Pulse 58  Temp(Src) 96.7 F (35.9 C) (Oral)  Ht 5' 9"  (1.753 m)  Wt 197 lb 3.2 oz (89.449 kg)  BMI 29.11 kg/m2  BP Readings from Last 3 Encounters:  09/04/14 153/63  07/26/14 124/66  06/27/14 122/63    Wt Readings from Last 3 Encounters:  09/04/14 197 lb 3.2 oz (89.449 kg)  07/26/14 196 lb 3.2 oz (88.996 kg)  06/27/14 195 lb (88.451 kg)     Physical Exam  Lab Results  Component Value Date   HGBA1C 6.6 09/04/2014  HGBA1C 6.4% 05/27/2014   HGBA1C 6.6 01/01/2013    Lab Results  Component Value Date   WBC 8.1 05/27/2014   HGB 14.3 05/27/2014   HCT 46.1 05/27/2014   PLT 265 01/14/2013   GLUCOSE 156* 09/04/2014   CHOL 150 05/27/2014   TRIG 327* 05/27/2014   HDL 32* 05/27/2014   LDLCALC 53 05/27/2014   ALT 46* 09/04/2014   AST 36 09/04/2014   NA 143 09/04/2014   K 5.1 09/04/2014   CL 103 09/04/2014   CREATININE 1.03 09/04/2014   BUN 14 09/04/2014   CO2  23 09/04/2014   TSH 3.050 09/04/2014   INR 1.19 01/10/2013   HGBA1C 6.6 09/04/2014    Ct Abdomen Pelvis W Contrast  01/10/2013   CLINICAL DATA:  right-sided abdominal pain with low-grade fever  EXAM: CT ABDOMEN AND PELVIS WITH CONTRAST  TECHNIQUE: Multidetector CT imaging of the abdomen and pelvis was performed using the standard protocol following bolus administration of intravenous contrast. Oral contrast was also administered.  CONTRAST:  115m OMNIPAQUE IOHEXOL 300 MG/ML  SOLN  COMPARISON:  None.  FINDINGS: There is scarring in the anterior lung base. There is extensive coronary artery calcification.  There is fatty change in the liver. No focal liver lesions are identified. There is no biliary duct dilatation. Gallbladder wall is not thickened.  The spleen, pancreas, and adrenals appear normal. The kidneys bilaterally show no mass or hydronephrosis on either side.  In the pelvis, the appendix is dilated with extensive surrounding inflammation consistent with acute appendicitis. There is no well-defined abscess. Elsewhere in the pelvis, there is no mass or fluid collection. Prostate is mildly enlarged.  There is no bowel obstruction. No free air or portal venous air.  There is no ascites, adenopathy, or abscess in the abdomen or pelvis.  There is extensive atherosclerotic change in the aorta but no aneurysm. There is extensive arthropathy throughout the lumbar spine. There are no blastic or lytic bone lesions.  IMPRESSION: Evidence of acute appendicitis. No frank abscess. No bowel obstruction.  Fatty liver.  Extensive lumbar arthropathy. Extensive atherosclerotic change.  Critical Value/emergent results were called by telephone at the time of interpretation on 01/10/2013 at 4:47 PM to Dr.Donald MLaurance Flatten, who verbally acknowledged these results.   Electronically Signed   By: WLowella GripM.D.   On: 01/10/2013 16:53   Dg Chest Port 1 View  01/10/2013   CLINICAL DATA:  Initial encounter for current  episode of shortness of breath. Current history of coronary artery disease, prior CABG. Current history of hypertension, diabetes, and hyperlipidemia.  EXAM: PORTABLE CHEST - 1 VIEW  COMPARISON:  Two-view chest x-ray 04/02/2009 and portable chest x-rays 07/12/2006 dating back to 06/30/2006.  FINDINGS: Prior sternotomy for CABG. Cardiac silhouette mildly to moderately enlarged but stable, allowing for differences in technique. Pulmonary vascularity normal without evidence of pulmonary edema. Mildly prominent bronchovascular markings diffusely and mild to moderate central peribronchial thickening, more so than on the prior examination from 03/2009. No confluent airspace consolidation. No pleural effusions. No pneumothorax.  IMPRESSION: Mild to moderate changes of acute bronchitis and/or asthma without localized airspace pneumonia. Stable mild to moderate cardiomegaly without pulmonary edema.   Electronically Signed   By: TEvangeline DakinM.D.   On: 01/10/2013 17:38    Assessment & Plan:   TMarkeisewas seen today for hypertension and diabetes.  Diagnoses and all orders for this visit:  Diabetes mellitus without complication Orders: -     POCT glycosylated hemoglobin (Hb  A1C) -     CMP14+EGFR -     TSH -     T4, free  Essential hypertension Orders: -     POCT glycosylated hemoglobin (Hb A1C) -     CMP14+EGFR -     TSH -     T4, free  Other orders -     metoprolol succinate (TOPROL-XL) 100 MG 24 hr tablet; Take 1 tablet (100 mg total) by mouth daily. Take with or immediately following a meal.   I have changed Mr. Schwandt metoprolol succinate. I am also having him maintain his multivitamin, aspirin, omeprazole, etodolac, levothyroxine, glimepiride, blood glucose meter kit and supplies, valsartan, and lovastatin.  Meds ordered this encounter  Medications  . metoprolol succinate (TOPROL-XL) 100 MG 24 hr tablet    Sig: Take 1 tablet (100 mg total) by mouth daily. Take with or immediately  following a meal.    Dispense:  30 tablet    Refill:  5    Please place refills on file if pt does not need them yet     Follow-up: Return in about 3 months (around 12/05/2014).  Claretta Fraise, M.D.

## 2014-09-05 LAB — CMP14+EGFR
ALT: 46 IU/L — AB (ref 0–44)
AST: 36 IU/L (ref 0–40)
Albumin/Globulin Ratio: 1.8 (ref 1.1–2.5)
Albumin: 4 g/dL (ref 3.2–4.6)
Alkaline Phosphatase: 56 IU/L (ref 39–117)
BUN / CREAT RATIO: 14 (ref 10–22)
BUN: 14 mg/dL (ref 10–36)
Bilirubin Total: 0.5 mg/dL (ref 0.0–1.2)
CALCIUM: 9.1 mg/dL (ref 8.6–10.2)
CHLORIDE: 103 mmol/L (ref 97–108)
CO2: 23 mmol/L (ref 18–29)
CREATININE: 1.03 mg/dL (ref 0.76–1.27)
GFR calc non Af Amer: 62 mL/min/{1.73_m2} (ref >59)
GFR, EST AFRICAN AMERICAN: 72 mL/min/{1.73_m2} (ref >59)
Globulin, Total: 2.2 g/dL (ref 1.5–4.5)
Glucose: 156 mg/dL — ABNORMAL HIGH (ref 65–99)
POTASSIUM: 5.1 mmol/L (ref 3.5–5.2)
Sodium: 143 mmol/L (ref 134–144)
Total Protein: 6.2 g/dL (ref 6.0–8.5)

## 2014-09-05 LAB — TSH: TSH: 3.05 u[IU]/mL (ref 0.450–4.500)

## 2014-09-05 LAB — T4, FREE: FREE T4: 1.35 ng/dL (ref 0.82–1.77)

## 2014-10-10 ENCOUNTER — Other Ambulatory Visit: Payer: Self-pay

## 2014-10-10 DIAGNOSIS — E1142 Type 2 diabetes mellitus with diabetic polyneuropathy: Secondary | ICD-10-CM | POA: Diagnosis not present

## 2014-10-10 DIAGNOSIS — L84 Corns and callosities: Secondary | ICD-10-CM | POA: Diagnosis not present

## 2014-10-10 DIAGNOSIS — B351 Tinea unguium: Secondary | ICD-10-CM | POA: Diagnosis not present

## 2014-10-10 DIAGNOSIS — H2513 Age-related nuclear cataract, bilateral: Secondary | ICD-10-CM | POA: Diagnosis not present

## 2014-10-10 DIAGNOSIS — H40033 Anatomical narrow angle, bilateral: Secondary | ICD-10-CM | POA: Diagnosis not present

## 2014-10-10 MED ORDER — ETODOLAC 200 MG PO CAPS: ORAL_CAPSULE | ORAL | Status: DC

## 2014-10-30 ENCOUNTER — Other Ambulatory Visit: Payer: Self-pay | Admitting: Family Medicine

## 2014-11-26 ENCOUNTER — Other Ambulatory Visit: Payer: Self-pay | Admitting: Family Medicine

## 2014-12-07 ENCOUNTER — Other Ambulatory Visit: Payer: Self-pay | Admitting: Family Medicine

## 2014-12-09 ENCOUNTER — Ambulatory Visit (INDEPENDENT_AMBULATORY_CARE_PROVIDER_SITE_OTHER): Payer: Medicare Other | Admitting: Family Medicine

## 2014-12-09 ENCOUNTER — Encounter: Payer: Self-pay | Admitting: Family Medicine

## 2014-12-09 VITALS — BP 158/65 | HR 57 | Temp 97.4°F | Ht 69.0 in | Wt 196.4 lb

## 2014-12-09 DIAGNOSIS — F0390 Unspecified dementia without behavioral disturbance: Secondary | ICD-10-CM

## 2014-12-09 DIAGNOSIS — E785 Hyperlipidemia, unspecified: Secondary | ICD-10-CM | POA: Diagnosis not present

## 2014-12-09 DIAGNOSIS — I2510 Atherosclerotic heart disease of native coronary artery without angina pectoris: Secondary | ICD-10-CM | POA: Diagnosis not present

## 2014-12-09 DIAGNOSIS — I10 Essential (primary) hypertension: Secondary | ICD-10-CM | POA: Diagnosis not present

## 2014-12-09 DIAGNOSIS — E119 Type 2 diabetes mellitus without complications: Secondary | ICD-10-CM | POA: Diagnosis not present

## 2014-12-09 DIAGNOSIS — E039 Hypothyroidism, unspecified: Secondary | ICD-10-CM | POA: Diagnosis not present

## 2014-12-09 LAB — POCT GLYCOSYLATED HEMOGLOBIN (HGB A1C): Hemoglobin A1C: 6.5

## 2014-12-09 MED ORDER — ETODOLAC 200 MG PO CAPS: ORAL_CAPSULE | ORAL | Status: DC

## 2014-12-09 MED ORDER — GLIMEPIRIDE 2 MG PO TABS: ORAL_TABLET | ORAL | Status: DC

## 2014-12-09 NOTE — Progress Notes (Signed)
Subjective:  Patient ID: [name] [name], male    DOB: [birth year]  Age: 81 y.o. MRN: [id number]  CC: Diabetes; Hyperlipidemia; Hypertension; and Hypothyroidism   HPI [name] [name] presents for  follow-up of hypertension. Patient has no history of headache chest pain or shortness of breath or recent cough. Patient also denies symptoms of TIA such as numbness weakness lateralizing. Patient checks  blood pressure at home and has not had any elevated readings recently. Patient denies side effects from his medication. States taking it regularly.  Patient also  in for follow-up of elevated cholesterol. Doing well without complaints on current medication. Denies side effects of statin including myalgia and arthralgia and nausea. Also in today for liver function testing. Currently no chest pain, shortness of breath or other cardiovascular related symptoms noted.  Follow-up of diabetes. Patient does not regularly check blood sugar at home. He can't check for himself. His daughter will do it when she comes over but she is not sure whether it's a fasting postprandial or right after meals so it's hard to interpret and based on his previous visit here and my concerns about needing to know, daughter says she just quit checking. Patient denies symptoms such as polyuria, polydipsia, excessive hunger, nausea No significant hypoglycemic spells noted. Medications as noted below. Taking them regularly without complication/adverse reaction being reported today. The daughter says she is fixing up his medicines for him and he takes the most days but sometimes they don't know if he skipped on board took 2 days worth in 1 day.   History [name] [name] has a past medical history of HTN (hypertension); Hyperlipidemia; CAD (coronary artery disease) (2002); Diabetic neuropathy; Respiratory failure; Ejection fraction; CABG; Carotid artery disease; and Shortness of breath.   He has past surgical history that includes Coronary artery  bypass graft; Knee Arthroplasty; Carpal tunnel release; and laparoscopic appendectomy (N/A, 01/10/2013).   His family history includes Heart attack (age of onset: 40) in his father; Heart disease in his father; Heart failure (age of onset: 21) in his mother.He reports that he has never smoked. He does not have any smokeless tobacco history on file. He reports that he does not drink alcohol or use illicit drugs.  Current Outpatient Prescriptions on File Prior to Visit  Medication Sig Dispense Refill  . aspirin 81 MG tablet Take 81 mg by mouth daily.    Marland Kitchen levothyroxine (SYNTHROID, LEVOTHROID) 50 MCG tablet Take 1 tablet (50 mcg total) by mouth daily. 90 tablet 3  . lovastatin (MEVACOR) 20 MG tablet Take 1 tablet (20 mg total) by mouth at bedtime. 90 tablet 3  . metoprolol succinate (TOPROL-XL) 100 MG 24 hr tablet Take 1 tablet (100 mg total) by mouth daily. Take with or immediately following a meal. 30 tablet 5  . Multiple Vitamin (MULTIVITAMIN) tablet Take 1 tablet by mouth daily.    Marland Kitchen omeprazole (PRILOSEC) 20 MG capsule Take 20 mg by mouth. Given on Mondays and Thurs    . valsartan (DIOVAN) 80 MG tablet Take 1 tablet (80 mg total) by mouth daily. 90 tablet 3   No current facility-administered medications on file prior to visit.    ROS Review of Systems  Constitutional: Negative for fever, chills and diaphoresis.  HENT: Negative for congestion, rhinorrhea and sore throat.   Respiratory: Negative for cough, shortness of breath and wheezing.   Cardiovascular: Negative for chest pain.  Gastrointestinal: Negative for nausea, vomiting, abdominal pain, diarrhea, constipation and abdominal distention.  Genitourinary: Negative for dysuria  and frequency.  Musculoskeletal: Negative for joint swelling and arthralgias.  Skin: Negative for rash.  Neurological: Negative for headaches.    Objective:  BP 158/65 mmHg  Pulse 57  Temp(Src) 97.4 F (36.3 C) (Oral)  Ht 5' 9"  (1.753 m)  Wt 196 lb 6.4 oz  (89.086 kg)  BMI 28.99 kg/m2  BP Readings from Last 3 Encounters:  12/09/14 158/65  09/04/14 153/63  07/26/14 124/66    Wt Readings from Last 3 Encounters:  12/09/14 196 lb 6.4 oz (89.086 kg)  09/04/14 197 lb 3.2 oz (89.449 kg)  07/26/14 196 lb 3.2 oz (88.996 kg)     Physical Exam  Constitutional: He is oriented to person, place, and time. He appears well-developed and well-nourished. No distress.  HENT:  Head: Normocephalic and atraumatic.  Right Ear: External ear normal.  Left Ear: External ear normal.  Nose: Nose normal.  Mouth/Throat: Oropharynx is clear and moist.  Eyes: Conjunctivae and EOM are normal. Pupils are equal, round, and reactive to light.  Neck: Normal range of motion. Neck supple. No thyromegaly present.  Cardiovascular: Normal rate, regular rhythm and normal heart sounds.   No murmur heard. Pulmonary/Chest: Effort normal and breath sounds normal. No respiratory distress. He has no wheezes. He has no rales.  Abdominal: Soft. Bowel sounds are normal. He exhibits no distension. There is no tenderness.  Lymphadenopathy:    He has no cervical adenopathy.  Neurological: He is alert and oriented to person, place, and time. He has normal reflexes.  Skin: Skin is warm and dry.  Psychiatric: He has a normal mood and affect. His behavior is normal. Judgment and thought content normal.  Congenial and cheerful with normal mood and behavior. Repeats himself and statements that his daughter had already made.    Lab Results  Component Value Date   HGBA1C 6.5 12/09/2014   HGBA1C 6.6 09/04/2014   HGBA1C 6.4% 05/27/2014    Lab Results  Component Value Date   WBC 8.1 05/27/2014   HGB 14.3 05/27/2014   HCT 46.1 05/27/2014   PLT 265 01/14/2013   GLUCOSE 156* 09/04/2014   CHOL 150 05/27/2014   TRIG 327* 05/27/2014   HDL 32* 05/27/2014   LDLCALC 53 05/27/2014   ALT 46* 09/04/2014   AST 36 09/04/2014   NA 143 09/04/2014   K 5.1 09/04/2014   CL 103 09/04/2014    CREATININE 1.03 09/04/2014   BUN 14 09/04/2014   CO2 23 09/04/2014   TSH 3.050 09/04/2014   INR 1.19 01/10/2013   HGBA1C 6.5 12/09/2014    Ct Abdomen Pelvis W Contrast  01/10/2013   CLINICAL DATA:  right-sided abdominal pain with low-grade fever  EXAM: CT ABDOMEN AND PELVIS WITH CONTRAST  TECHNIQUE: Multidetector CT imaging of the abdomen and pelvis was performed using the standard protocol following bolus administration of intravenous contrast. Oral contrast was also administered.  CONTRAST:  195m OMNIPAQUE IOHEXOL 300 MG/ML  SOLN  COMPARISON:  None.  FINDINGS: There is scarring in the anterior lung base. There is extensive coronary artery calcification.  There is fatty change in the liver. No focal liver lesions are identified. There is no biliary duct dilatation. Gallbladder wall is not thickened.  The spleen, pancreas, and adrenals appear normal. The kidneys bilaterally show no mass or hydronephrosis on either side.  In the pelvis, the appendix is dilated with extensive surrounding inflammation consistent with acute appendicitis. There is no well-defined abscess. Elsewhere in the pelvis, there is no mass or fluid collection. Prostate  is mildly enlarged.  There is no bowel obstruction. No free air or portal venous air.  There is no ascites, adenopathy, or abscess in the abdomen or pelvis.  There is extensive atherosclerotic change in the aorta but no aneurysm. There is extensive arthropathy throughout the lumbar spine. There are no blastic or lytic bone lesions.  IMPRESSION: Evidence of acute appendicitis. No frank abscess. No bowel obstruction.  Fatty liver.  Extensive lumbar arthropathy. Extensive atherosclerotic change.  Critical Value/emergent results were called by telephone at the time of interpretation on 01/10/2013 at 4:47 PM to Dr.Donald Laurance Flatten , who verbally acknowledged these results.   Electronically Signed   By: Lowella Grip M.D.   On: 01/10/2013 16:53   Dg Chest Port 1  View  01/10/2013   CLINICAL DATA:  Initial encounter for current episode of shortness of breath. Current history of coronary artery disease, prior CABG. Current history of hypertension, diabetes, and hyperlipidemia.  EXAM: PORTABLE CHEST - 1 VIEW  COMPARISON:  Two-view chest x-ray 04/02/2009 and portable chest x-rays 07/12/2006 dating back to 06/30/2006.  FINDINGS: Prior sternotomy for CABG. Cardiac silhouette mildly to moderately enlarged but stable, allowing for differences in technique. Pulmonary vascularity normal without evidence of pulmonary edema. Mildly prominent bronchovascular markings diffusely and mild to moderate central peribronchial thickening, more so than on the prior examination from 03/2009. No confluent airspace consolidation. No pleural effusions. No pneumothorax.  IMPRESSION: Mild to moderate changes of acute bronchitis and/or asthma without localized airspace pneumonia. Stable mild to moderate cardiomegaly without pulmonary edema.   Electronically Signed   By: Evangeline Dakin M.D.   On: 01/10/2013 17:38    Assessment & Plan:   [name] [name] was seen today for diabetes, hyperlipidemia, hypertension and hypothyroidism.  Diagnoses and all orders for this visit:  Diabetes mellitus without complication -     POCT glycosylated hemoglobin (Hb A1C) -     CMP14+EGFR  Essential hypertension -     CMP14+EGFR -     CBC with Differential/Platelet  Hyperlipidemia -     CMP14+EGFR -     Lipid panel  Hypothyroidism, unspecified hypothyroidism type -     TSH  Dementia, without behavioral disturbance  Other orders -     etodolac (LODINE) 200 MG capsule; 2 capsules 400 MG Monday and Thursdays -     glimepiride (AMARYL) 2 MG tablet; TAKE 1 TABLET (2 MG TOTAL) BY MOUTH DAILY BEFORE BREAKFAST.   I have discontinued Mr. Cupp blood glucose meter kit and supplies. I am also having him maintain his multivitamin, aspirin, omeprazole, levothyroxine, valsartan, lovastatin, metoprolol  succinate, etodolac, and glimepiride.  Meds ordered this encounter  Medications  . etodolac (LODINE) 200 MG capsule    Sig: 2 capsules 400 MG Monday and Thursdays    Dispense:  16 capsule    Refill:  5  . glimepiride (AMARYL) 2 MG tablet    Sig: TAKE 1 TABLET (2 MG TOTAL) BY MOUTH DAILY BEFORE BREAKFAST.    Dispense:  30 tablet    Refill:  3     Follow-up: Return in about 3 months (around 03/10/2015) for diabetes, hypertension, Hypothyroidism.  Claretta Fraise, M.D.

## 2014-12-10 LAB — CMP14+EGFR
ALBUMIN: 3.9 g/dL (ref 3.2–4.6)
ALT: 41 IU/L (ref 0–44)
AST: 32 IU/L (ref 0–40)
Albumin/Globulin Ratio: 1.5 (ref 1.1–2.5)
Alkaline Phosphatase: 54 IU/L (ref 39–117)
BILIRUBIN TOTAL: 0.4 mg/dL (ref 0.0–1.2)
BUN / CREAT RATIO: 14 (ref 10–22)
BUN: 17 mg/dL (ref 10–36)
CALCIUM: 9.4 mg/dL (ref 8.6–10.2)
CHLORIDE: 102 mmol/L (ref 97–108)
CO2: 22 mmol/L (ref 18–29)
Creatinine, Ser: 1.18 mg/dL (ref 0.76–1.27)
GFR, EST AFRICAN AMERICAN: 61 mL/min/{1.73_m2} (ref >59)
GFR, EST NON AFRICAN AMERICAN: 53 mL/min/{1.73_m2} — AB (ref >59)
GLUCOSE: 139 mg/dL — AB (ref 65–99)
Globulin, Total: 2.6 g/dL (ref 1.5–4.5)
Potassium: 4.3 mmol/L (ref 3.5–5.2)
Sodium: 140 mmol/L (ref 134–144)
TOTAL PROTEIN: 6.5 g/dL (ref 6.0–8.5)

## 2014-12-10 LAB — CBC WITH DIFFERENTIAL/PLATELET
BASOS ABS: 0.1 10*3/uL (ref 0.0–0.2)
BASOS: 1 %
EOS (ABSOLUTE): 0.2 10*3/uL (ref 0.0–0.4)
EOS: 2 %
HEMATOCRIT: 39.7 % (ref 37.5–51.0)
HEMOGLOBIN: 13.6 g/dL (ref 12.6–17.7)
IMMATURE GRANS (ABS): 0 10*3/uL (ref 0.0–0.1)
Immature Granulocytes: 0 %
LYMPHS ABS: 2.4 10*3/uL (ref 0.7–3.1)
Lymphs: 31 %
MCH: 33.7 pg — AB (ref 26.6–33.0)
MCHC: 34.3 g/dL (ref 31.5–35.7)
MCV: 99 fL — AB (ref 79–97)
MONOCYTES: 9 %
Monocytes Absolute: 0.7 10*3/uL (ref 0.1–0.9)
NEUTROS ABS: 4.5 10*3/uL (ref 1.4–7.0)
Neutrophils: 57 %
Platelets: 243 10*3/uL (ref 150–379)
RBC: 4.03 x10E6/uL — ABNORMAL LOW (ref 4.14–5.80)
RDW: 13.8 % (ref 12.3–15.4)
WBC: 7.8 10*3/uL (ref 3.4–10.8)

## 2014-12-10 LAB — LIPID PANEL
Chol/HDL Ratio: 3.7 ratio units (ref 0.0–5.0)
Cholesterol, Total: 128 mg/dL (ref 100–199)
HDL: 35 mg/dL — AB (ref >39)
LDL Calculated: 45 mg/dL (ref 0–99)
Triglycerides: 240 mg/dL — ABNORMAL HIGH (ref 0–149)
VLDL Cholesterol Cal: 48 mg/dL — ABNORMAL HIGH (ref 5–40)

## 2014-12-10 LAB — TSH: TSH: 3.39 u[IU]/mL (ref 0.450–4.500)

## 2015-01-09 DIAGNOSIS — B351 Tinea unguium: Secondary | ICD-10-CM | POA: Diagnosis not present

## 2015-01-09 DIAGNOSIS — E1142 Type 2 diabetes mellitus with diabetic polyneuropathy: Secondary | ICD-10-CM | POA: Diagnosis not present

## 2015-01-09 DIAGNOSIS — L84 Corns and callosities: Secondary | ICD-10-CM | POA: Diagnosis not present

## 2015-03-12 ENCOUNTER — Encounter: Payer: Self-pay | Admitting: Family Medicine

## 2015-03-12 ENCOUNTER — Ambulatory Visit (INDEPENDENT_AMBULATORY_CARE_PROVIDER_SITE_OTHER): Payer: Medicare Other | Admitting: Family Medicine

## 2015-03-12 VITALS — BP 159/66 | HR 58 | Temp 96.8°F | Ht 69.0 in | Wt 196.6 lb

## 2015-03-12 DIAGNOSIS — E785 Hyperlipidemia, unspecified: Secondary | ICD-10-CM

## 2015-03-12 DIAGNOSIS — Z23 Encounter for immunization: Secondary | ICD-10-CM

## 2015-03-12 DIAGNOSIS — I2510 Atherosclerotic heart disease of native coronary artery without angina pectoris: Secondary | ICD-10-CM | POA: Diagnosis not present

## 2015-03-12 DIAGNOSIS — I10 Essential (primary) hypertension: Secondary | ICD-10-CM | POA: Diagnosis not present

## 2015-03-12 DIAGNOSIS — E119 Type 2 diabetes mellitus without complications: Secondary | ICD-10-CM

## 2015-03-12 LAB — POCT URINALYSIS DIPSTICK
Bilirubin, UA: NEGATIVE
Blood, UA: NEGATIVE
Glucose, UA: NEGATIVE
KETONES UA: NEGATIVE
LEUKOCYTES UA: NEGATIVE
Nitrite, UA: NEGATIVE
PROTEIN UA: NEGATIVE
Spec Grav, UA: 1.015
UROBILINOGEN UA: NEGATIVE
pH, UA: 6

## 2015-03-12 NOTE — Patient Instructions (Signed)
Soak feet every night. Then gently abrade with pumice. Afterward apply cetaphil lotion.  Diabetes and Foot Care Diabetes may cause you to have problems because of poor blood supply (circulation) to your feet and legs. This may cause the skin on your feet to become thinner, break easier, and heal more slowly. Your skin may become dry, and the skin may peel and crack. You may also have nerve damage in your legs and feet causing decreased feeling in them. You may not notice minor injuries to your feet that could lead to infections or more serious problems. Taking care of your feet is one of the most important things you can do for yourself.  HOME CARE INSTRUCTIONS  Wear shoes at all times, even in the house. Do not go barefoot. Bare feet are easily injured.  Check your feet daily for blisters, cuts, and redness. If you cannot see the bottom of your feet, use a mirror or ask someone for help.  Wash your feet with warm water (do not use hot water) and mild soap. Then pat your feet and the areas between your toes until they are completely dry. Do not soak your feet as this can dry your skin.  Apply a moisturizing lotion or petroleum jelly (that does not contain alcohol and is unscented) to the skin on your feet and to dry, brittle toenails. Do not apply lotion between your toes.  Trim your toenails straight across. Do not dig under them or around the cuticle. File the edges of your nails with an emery board or nail file.  Do not cut corns or calluses or try to remove them with medicine.  Wear clean socks or stockings every day. Make sure they are not too tight. Do not wear knee-high stockings since they may decrease blood flow to your legs.  Wear shoes that fit properly and have enough cushioning. To break in new shoes, wear them for just a few hours a day. This prevents you from injuring your feet. Always look in your shoes before you put them on to be sure there are no objects inside.  Do not cross  your legs. This may decrease the blood flow to your feet.  If you find a minor scrape, cut, or break in the skin on your feet, keep it and the skin around it clean and dry. These areas may be cleansed with mild soap and water. Do not cleanse the area with peroxide, alcohol, or iodine.  When you remove an adhesive bandage, be sure not to damage the skin around it.  If you have a wound, look at it several times a day to make sure it is healing.  Do not use heating pads or hot water bottles. They may burn your skin. If you have lost feeling in your feet or legs, you may not know it is happening until it is too late.  Make sure your health care provider performs a complete foot exam at least annually or more often if you have foot problems. Report any cuts, sores, or bruises to your health care provider immediately. SEEK MEDICAL CARE IF:   You have an injury that is not healing.  You have cuts or breaks in the skin.  You have an ingrown nail.  You notice redness on your legs or feet.  You feel burning or tingling in your legs or feet.  You have pain or cramps in your legs and feet.  Your legs or feet are numb.  Your feet  always feel cold. SEEK IMMEDIATE MEDICAL CARE IF:   There is increasing redness, swelling, or pain in or around a wound.  There is a red line that goes up your leg.  Pus is coming from a wound.  You develop a fever or as directed by your health care provider.  You notice a bad smell coming from an ulcer or wound.   This information is not intended to replace advice given to you by your health care provider. Make sure you discuss any questions you have with your health care provider.   Document Released: 03/12/2000 Document Revised: 11/15/2012 Document Reviewed: 08/22/2012 Elsevier Interactive Patient Education [employer].

## 2015-03-12 NOTE — Progress Notes (Signed)
Subjective:  Patient ID: [name] [name], male    DOB: Mar 07, 1919  Age: 81 y.o. MRN: [id number]  CC: Hypertension; Diabetes; and Hypothyroidism   HPI [name] [name] presents forFollow-up of diabetes. Patient does not check blood sugar at home Patient denies symptoms such as polyuria, polydipsia, excessive hunger, nausea No significant hypoglycemic spells noted. Medications as noted below. Taking them regularly without complication/adverse reaction being reported today.   Patient presents for follow-up on  thyroid. She has a history of hypothyroidism for many years. It has been stable recently. Pt. denies any change in  voice, loss of hair, heat or cold intolerance. Energy level has been adequate to good. She denies constipation and diarrhea. No myxedema. Medication is as noted below. Verified that pt is taking it daily on an empty stomach. Well tolerated.   follow-up of hypertension. Patient has no history of headache chest pain or shortness of breath or recent cough. Patient also denies symptoms of TIA such as numbness weakness lateralizing.  Patient denies side effects from his medication. States taking it regularly.     History [name] [name] has a past medical history of HTN (hypertension); Hyperlipidemia; CAD (coronary artery disease) (2002); Diabetic neuropathy ([place]); Respiratory failure ([place]); Ejection fraction; CABG; Carotid artery disease ([place]); and Shortness of breath.   He has past surgical history that includes Coronary artery bypass graft; Knee Arthroplasty; Carpal tunnel release; and laparoscopic appendectomy (N/A, 01/10/2013).   His family history includes Heart attack (age of onset: 11) in his father; Heart disease in his father; Heart failure (age of onset: 59) in his mother.He reports that he has never smoked. He does not have any smokeless tobacco history on file. He reports that he does not drink alcohol or use illicit drugs.  Current Outpatient Prescriptions on File Prior to  Visit  Medication Sig Dispense Refill  . aspirin 81 MG tablet Take 81 mg by mouth daily.    Marland Kitchen etodolac (LODINE) 200 MG capsule 2 capsules 400 MG Monday and Thursdays 16 capsule 5  . glimepiride (AMARYL) 2 MG tablet TAKE 1 TABLET (2 MG TOTAL) BY MOUTH DAILY BEFORE BREAKFAST. 30 tablet 3  . levothyroxine (SYNTHROID, LEVOTHROID) 50 MCG tablet Take 1 tablet (50 mcg total) by mouth daily. 90 tablet 3  . lovastatin (MEVACOR) 20 MG tablet Take 1 tablet (20 mg total) by mouth at bedtime. 90 tablet 3  . metoprolol succinate (TOPROL-XL) 100 MG 24 hr tablet Take 1 tablet (100 mg total) by mouth daily. Take with or immediately following a meal. 30 tablet 5  . Multiple Vitamin (MULTIVITAMIN) tablet Take 1 tablet by mouth daily.    Marland Kitchen omeprazole (PRILOSEC) 20 MG capsule Take 20 mg by mouth. Given on Mondays and Thurs    . valsartan (DIOVAN) 80 MG tablet Take 1 tablet (80 mg total) by mouth daily. 90 tablet 3   No current facility-administered medications on file prior to visit.    ROS Review of Systems  Constitutional: Negative for fever, chills, diaphoresis and unexpected weight change.  HENT: Negative for congestion, hearing loss, rhinorrhea and sore throat.   Eyes: Negative for visual disturbance.  Respiratory: Negative for cough and shortness of breath.   Cardiovascular: Negative for chest pain.  Gastrointestinal: Negative for abdominal pain, diarrhea and constipation.  Genitourinary: Negative for dysuria and flank pain.  Musculoskeletal: Negative for joint swelling and arthralgias.  Skin: Negative for rash.  Neurological: Negative for dizziness and headaches.  Psychiatric/Behavioral: Negative for sleep disturbance and dysphoric mood.  Objective:  BP 159/66 mmHg  Pulse 58  Temp(Src) 96.8 F (36 C) (Oral)  Ht 5' 9"  (1.753 m)  Wt 196 lb 9.6 oz (89.177 kg)  BMI 29.02 kg/m2  SpO2 99%  BP Readings from Last 3 Encounters:  03/12/15 159/66  12/09/14 158/65  09/04/14 153/63    Wt  Readings from Last 3 Encounters:  03/12/15 196 lb 9.6 oz (89.177 kg)  12/09/14 196 lb 6.4 oz (89.086 kg)  09/04/14 197 lb 3.2 oz (89.449 kg)     Physical Exam  Constitutional: He is oriented to person, place, and time. He appears well-developed and well-nourished. No distress.  HENT:  Head: Normocephalic and atraumatic.  Right Ear: External ear normal.  Left Ear: External ear normal.  Nose: Nose normal.  Mouth/Throat: Oropharynx is clear and moist.  Eyes: Conjunctivae and EOM are normal. Pupils are equal, round, and reactive to light.  Neck: Normal range of motion. Neck supple. No thyromegaly present.  Cardiovascular: Normal rate, regular rhythm and normal heart sounds.   No murmur heard. Pulmonary/Chest: Effort normal and breath sounds normal. No respiratory distress. He has no wheezes. He has no rales.  Abdominal: Soft. Bowel sounds are normal. He exhibits no distension. There is no tenderness.  Lymphadenopathy:    He has no cervical adenopathy.  Neurological: He is alert and oriented to person, place, and time. He has normal reflexes.  Skin: Skin is warm and dry.  Psychiatric: He has a normal mood and affect. His behavior is normal. Judgment and thought content normal.   Diabetic Foot Exam - Simple   Simple Foot Form  Diabetic Foot exam was performed with the following findings:  Yes 03/12/2015  3:56 PM  Visual Inspection  See comments:  Yes  Sensation Testing  See comments:  Yes  Pulse Check  See comments:  Yes  Comments  Onychomycosis all toes. Scly, dry, flaky skin on soles and forefeet     Lab Results  Component Value Date   HGBA1C 6.5 12/09/2014   HGBA1C 6.6 09/04/2014   HGBA1C 6.4% 05/27/2014    Lab Results  Component Value Date   WBC 7.8 12/09/2014   HGB 14.3 05/27/2014   HCT 39.7 12/09/2014   PLT 265 01/14/2013   GLUCOSE 105* 03/12/2015   CHOL 128 12/09/2014   TRIG 240* 12/09/2014   HDL 35* 12/09/2014   LDLCALC 45 12/09/2014   ALT 31 03/12/2015    AST 25 03/12/2015   NA 139 03/12/2015   K 4.1 03/12/2015   CL 101 03/12/2015   CREATININE 1.17 03/12/2015   BUN 19 03/12/2015   CO2 25 03/12/2015   TSH 3.390 12/09/2014   INR 1.19 01/10/2013   HGBA1C 6.5 12/09/2014     Assessment & Plan:   [name] [name] was seen today for hypertension, diabetes and hypothyroidism.  Diagnoses and all orders for this visit:  Diabetes mellitus without complication (HCC) -     Microalbumin / creatinine urine ratio -     PR DIABETIC CUSTOM MOLDED SHOE -     POCT glycosylated hemoglobin (Hb A1C) -     CMP14+EGFR -     Microalbumin / creatinine urine ratio -     POCT urinalysis dipstick  Essential hypertension -     PR DIABETIC CUSTOM MOLDED SHOE -     POCT glycosylated hemoglobin (Hb A1C) -     CMP14+EGFR -     Microalbumin / creatinine urine ratio -     POCT urinalysis dipstick  Hyperlipidemia -  PR DIABETIC CUSTOM MOLDED SHOE -     POCT glycosylated hemoglobin (Hb A1C) -     CMP14+EGFR -     Microalbumin / creatinine urine ratio -     POCT urinalysis dipstick  Encounter for immunization  Other orders -     Pneumococcal conjugate vaccine 13-valent -     Flu vaccine HIGH DOSE PF   I am having [name] [name] maintain his multivitamin, aspirin, omeprazole, levothyroxine, valsartan, lovastatin, metoprolol succinate, etodolac, and glimepiride.  No orders of the defined types were placed in this encounter.     Follow-up: Return in about 3 months (around 06/10/2015).  Claretta Fraise, M.D.

## 2015-03-13 LAB — CMP14+EGFR
ALK PHOS: 54 IU/L (ref 39–117)
ALT: 31 IU/L (ref 0–44)
AST: 25 IU/L (ref 0–40)
Albumin/Globulin Ratio: 1.4 (ref 1.1–2.5)
Albumin: 3.9 g/dL (ref 3.2–4.6)
BUN/Creatinine Ratio: 16 (ref 10–22)
BUN: 19 mg/dL (ref 10–36)
Bilirubin Total: 0.4 mg/dL (ref 0.0–1.2)
CALCIUM: 9.1 mg/dL (ref 8.6–10.2)
CO2: 25 mmol/L (ref 18–29)
CREATININE: 1.17 mg/dL (ref 0.76–1.27)
Chloride: 101 mmol/L (ref 96–106)
GFR calc Af Amer: 61 mL/min/{1.73_m2} (ref >59)
GFR calc non Af Amer: 53 mL/min/{1.73_m2} — ABNORMAL LOW (ref >59)
GLUCOSE: 105 mg/dL — AB (ref 65–99)
Globulin, Total: 2.7 g/dL (ref 1.5–4.5)
Potassium: 4.1 mmol/L (ref 3.5–5.2)
SODIUM: 139 mmol/L (ref 134–144)
Total Protein: 6.6 g/dL (ref 6.0–8.5)

## 2015-03-13 LAB — MICROALBUMIN / CREATININE URINE RATIO
Creatinine, Urine: 133.2 mg/dL
MICROALB/CREAT RATIO: 12.8 mg/g{creat} (ref 0.0–30.0)
MICROALBUM., U, RANDOM: 17.1 ug/mL

## 2015-04-01 ENCOUNTER — Other Ambulatory Visit: Payer: Self-pay | Admitting: Family Medicine

## 2015-04-17 DIAGNOSIS — B351 Tinea unguium: Secondary | ICD-10-CM | POA: Diagnosis not present

## 2015-04-17 DIAGNOSIS — L84 Corns and callosities: Secondary | ICD-10-CM | POA: Diagnosis not present

## 2015-04-17 DIAGNOSIS — E1142 Type 2 diabetes mellitus with diabetic polyneuropathy: Secondary | ICD-10-CM | POA: Diagnosis not present

## 2015-06-04 ENCOUNTER — Other Ambulatory Visit: Payer: Self-pay | Admitting: Family Medicine

## 2015-06-10 ENCOUNTER — Other Ambulatory Visit: Payer: Self-pay | Admitting: Family Medicine

## 2015-06-11 ENCOUNTER — Ambulatory Visit: Payer: Medicare Other | Admitting: Family Medicine

## 2015-06-27 ENCOUNTER — Ambulatory Visit: Payer: Medicare Other | Admitting: Family Medicine

## 2015-07-02 ENCOUNTER — Ambulatory Visit (INDEPENDENT_AMBULATORY_CARE_PROVIDER_SITE_OTHER): Payer: Medicare Other | Admitting: Family Medicine

## 2015-07-02 ENCOUNTER — Encounter: Payer: Self-pay | Admitting: Family Medicine

## 2015-07-02 VITALS — BP 159/63 | HR 64 | Temp 97.8°F | Ht 69.0 in | Wt 199.0 lb

## 2015-07-02 DIAGNOSIS — E038 Other specified hypothyroidism: Secondary | ICD-10-CM

## 2015-07-02 DIAGNOSIS — E785 Hyperlipidemia, unspecified: Secondary | ICD-10-CM

## 2015-07-02 DIAGNOSIS — I10 Essential (primary) hypertension: Secondary | ICD-10-CM | POA: Diagnosis not present

## 2015-07-02 DIAGNOSIS — E1142 Type 2 diabetes mellitus with diabetic polyneuropathy: Secondary | ICD-10-CM

## 2015-07-02 DIAGNOSIS — F0390 Unspecified dementia without behavioral disturbance: Secondary | ICD-10-CM

## 2015-07-02 LAB — URINALYSIS
Bilirubin, UA: NEGATIVE
GLUCOSE, UA: NEGATIVE
KETONES UA: NEGATIVE
Leukocytes, UA: NEGATIVE
Nitrite, UA: NEGATIVE
PROTEIN UA: NEGATIVE
RBC UA: NEGATIVE
SPEC GRAV UA: 1.02 (ref 1.005–1.030)
UUROB: 1 mg/dL (ref 0.2–1.0)
pH, UA: 6 (ref 5.0–7.5)

## 2015-07-02 LAB — BAYER DCA HB A1C WAIVED: HB A1C: 6.6 % (ref <7.0)

## 2015-07-02 MED ORDER — MEMANTINE HCL ER 7 & 14 & 21 &28 MG PO CP24: ORAL_CAPSULE | ORAL | Status: DC

## 2015-07-02 NOTE — Progress Notes (Signed)
Subjective:  Patient ID: [name] [name], male    DOB: 12/13/1918  Age: 82 y.o. MRN: [id number]  CC: Hypertension; Hyperlipidemia; Diabetes; and Hypothyroidism  - HPI Ashutosh Dieguez Cherian presents for  follow-up of hypertension. Patient has no history of headache chest pain or shortness of breath or recent cough. Patient also denies symptoms of TIA such as numbness weakness lateralizing. Patient's daughter checks  blood pressure at home and has not had any elevated readings recently. Patient denies side effects from his medication. States taking it regularly.  Patient also  in for follow-up of elevated cholesterol. Doing well without complaints on current medication. Denies side effects of statin including myalgia and arthralgia and nausea. Also in today for liver function testing. Currently no chest pain, shortness of breath or other cardiovascular related symptoms noted.  Follow-up of diabetes. Patient does check blood sugar at home. Readings run between 110 and 125 Patient denies symptoms such as polyuria, polydipsia, excessive hunger, nausea No significant hypoglycemic spells noted. Medications as noted below. Taking them regularly without complication/adverse reaction being reported today.   Daughter concerned he is forgetful. Requests eval for dementia  Patient presents for follow-up on  thyroid. The patient has a history of hypothyroidism for many years. It has been stable recently. Pt. denies any change in  voice, loss of hair, heat or cold intolerance. Energy level has been adequate to good. Patient denies constipation and diarrhea. No myxedema. Medication is as noted below. Verified that pt is taking it daily on an empty stomach. Well tolerated.   History Zymere has a past medical history of HTN (hypertension); Hyperlipidemia; CAD (coronary artery disease) (2002); Diabetic neuropathy ([place]); Respiratory failure ([place]); Ejection fraction; CABG; Carotid artery disease ([place]); and Shortness of  breath.   He has past surgical history that includes Coronary artery bypass graft; Knee Arthroplasty; Carpal tunnel release; and laparoscopic appendectomy (N/A, 01/10/2013).   His family history includes Heart attack (age of onset: 57) in his father; Heart disease in his father; Heart failure (age of onset: 85) in his mother.He reports that he has never smoked. He does not have any smokeless tobacco history on file. He reports that he does not drink alcohol or use illicit drugs.  Current Outpatient Prescriptions on File Prior to Visit  Medication Sig Dispense Refill  . aspirin 81 MG tablet Take 81 mg by mouth daily.    Marland Kitchen etodolac (LODINE) 200 MG capsule TAKE 2 CAPSULES MONDAY AND THURSDAYS 16 capsule 0  . glimepiride (AMARYL) 2 MG tablet TAKE 1 TABLET (2 MG TOTAL) BY MOUTH DAILY BEFORE BREAKFAST. 30 tablet 3  . levothyroxine (SYNTHROID, LEVOTHROID) 50 MCG tablet TAKE 1 TABLET (50 MCG TOTAL) BY MOUTH DAILY. 90 tablet 1  . lovastatin (MEVACOR) 20 MG tablet Take 1 tablet (20 mg total) by mouth at bedtime. 90 tablet 3  . metoprolol succinate (TOPROL-XL) 100 MG 24 hr tablet TAKE 1 TABLET (100 MG TOTAL) BY MOUTH DAILY. TAKE WITH OR IMMEDIATELY FOLLOWING A MEAL. 30 tablet 2  . Multiple Vitamin (MULTIVITAMIN) tablet Take 1 tablet by mouth daily.    Marland Kitchen omeprazole (PRILOSEC) 20 MG capsule Take 20 mg by mouth. Given on Mondays and Thurs    . valsartan (DIOVAN) 80 MG tablet Take 1 tablet (80 mg total) by mouth daily. 90 tablet 3   No current facility-administered medications on file prior to visit.    ROS Review of Systems  Constitutional: Negative for fever, chills, diaphoresis and unexpected weight change.  HENT: Negative for  congestion, hearing loss, rhinorrhea and sore throat.   Eyes: Negative for visual disturbance.  Respiratory: Negative for cough and shortness of breath.   Cardiovascular: Negative for chest pain.  Gastrointestinal: Negative for abdominal pain, diarrhea and constipation.    Genitourinary: Negative for dysuria and flank pain.  Musculoskeletal: Negative for joint swelling and arthralgias.  Skin: Negative for rash.  Neurological: Negative for dizziness and headaches.  Psychiatric/Behavioral: Negative for behavioral problems, confusion, sleep disturbance, dysphoric mood and agitation. The patient is not nervous/anxious.     Objective:  BP 159/63 mmHg  Pulse 64  Temp(Src) 97.8 F (36.6 C) (Oral)  Ht 5' 9"  (1.753 m)  Wt 199 lb (90.266 kg)  BMI 29.37 kg/m2  SpO2 96%  BP Readings from Last 3 Encounters:  07/02/15 159/63  03/12/15 159/66  12/09/14 158/65    Wt Readings from Last 3 Encounters:  07/02/15 199 lb (90.266 kg)  03/12/15 196 lb 9.6 oz (89.177 kg)  12/09/14 196 lb 6.4 oz (89.086 kg)     Physical Exam  Constitutional: He is oriented to person, place, and time. He appears well-developed and well-nourished. No distress.  HENT:  Head: Normocephalic and atraumatic.  Right Ear: External ear normal.  Left Ear: External ear normal.  Nose: Nose normal.  Mouth/Throat: Oropharynx is clear and moist.  Eyes: Conjunctivae and EOM are normal. Pupils are equal, round, and reactive to light.  Neck: Normal range of motion. Neck supple. No thyromegaly present.  Cardiovascular: Normal rate, regular rhythm and normal heart sounds.   No murmur heard. Pulmonary/Chest: Effort normal and breath sounds normal. No respiratory distress. He has no wheezes. He has no rales.  Abdominal: Soft. Bowel sounds are normal. He exhibits no distension. There is no tenderness.  Lymphadenopathy:    He has no cervical adenopathy.  Neurological: He is alert and oriented to person, place, and time. He has normal reflexes.  Skin: Skin is warm and dry.  Psychiatric: He has a normal mood and affect. His behavior is normal. Judgment and thought content normal.   MMSE - Mini Mental State Exam 07/02/2015  Orientation to time 1  Orientation to Place 5  Registration 1  Attention/  Calculation 1  Recall 1  Language- name 2 objects 2  Language- repeat 1  Language- follow 3 step command 2  Language- read & follow direction 1  Write a sentence 1  Copy design 0  Total score 16    Lab Results  Component Value Date   HGBA1C 6.5 12/09/2014   HGBA1C 6.6 09/04/2014   HGBA1C 6.4% 05/27/2014    Lab Results  Component Value Date   WBC 7.8 12/09/2014   HGB 14.3 05/27/2014   HCT 39.7 12/09/2014   PLT 243 12/09/2014   GLUCOSE 105* 03/12/2015   CHOL 128 12/09/2014   TRIG 240* 12/09/2014   HDL 35* 12/09/2014   LDLCALC 45 12/09/2014   ALT 31 03/12/2015   AST 25 03/12/2015   NA 139 03/12/2015   K 4.1 03/12/2015   CL 101 03/12/2015   CREATININE 1.17 03/12/2015   BUN 19 03/12/2015   CO2 25 03/12/2015   TSH 3.390 12/09/2014   INR 1.19 01/10/2013   HGBA1C 6.5 12/09/2014    Ct Abdomen Pelvis W Contrast  01/10/2013  CLINICAL DATA:  right-sided abdominal pain with low-grade fever EXAM: CT ABDOMEN AND PELVIS WITH CONTRAST TECHNIQUE: Multidetector CT imaging of the abdomen and pelvis was performed using the standard protocol following bolus administration of intravenous contrast. Oral contrast  was also administered. CONTRAST:  175m OMNIPAQUE IOHEXOL 300 MG/ML  SOLN COMPARISON:  None. FINDINGS: There is scarring in the anterior lung base. There is extensive coronary artery calcification. There is fatty change in the liver. No focal liver lesions are identified. There is no biliary duct dilatation. Gallbladder wall is not thickened. The spleen, pancreas, and adrenals appear normal. The kidneys bilaterally show no mass or hydronephrosis on either side. In the pelvis, the appendix is dilated with extensive surrounding inflammation consistent with acute appendicitis. There is no well-defined abscess. Elsewhere in the pelvis, there is no mass or fluid collection. Prostate is mildly enlarged. There is no bowel obstruction. No free air or portal venous air. There is no ascites,  adenopathy, or abscess in the abdomen or pelvis. There is extensive atherosclerotic change in the aorta but no aneurysm. There is extensive arthropathy throughout the lumbar spine. There are no blastic or lytic bone lesions. IMPRESSION: Evidence of acute appendicitis. No frank abscess. No bowel obstruction. Fatty liver. Extensive lumbar arthropathy. Extensive atherosclerotic change. Critical Value/emergent results were called by telephone at the time of interpretation on 01/10/2013 at 4:47 PM to Dr.Donald MLaurance Flatten, who verbally acknowledged these results. Electronically Signed   By: WLowella GripM.D.   On: 01/10/2013 16:53   Dg Chest Port 1 View  01/10/2013  CLINICAL DATA:  Initial encounter for current episode of shortness of breath. Current history of coronary artery disease, prior CABG. Current history of hypertension, diabetes, and hyperlipidemia. EXAM: PORTABLE CHEST - 1 VIEW COMPARISON:  Two-view chest x-ray 04/02/2009 and portable chest x-rays 07/12/2006 dating back to 06/30/2006. FINDINGS: Prior sternotomy for CABG. Cardiac silhouette mildly to moderately enlarged but stable, allowing for differences in technique. Pulmonary vascularity normal without evidence of pulmonary edema. Mildly prominent bronchovascular markings diffusely and mild to moderate central peribronchial thickening, more so than on the prior examination from 03/2009. No confluent airspace consolidation. No pleural effusions. No pneumothorax. IMPRESSION: Mild to moderate changes of acute bronchitis and/or asthma without localized airspace pneumonia. Stable mild to moderate cardiomegaly without pulmonary edema. Electronically Signed   By: TEvangeline DakinM.D.   On: 01/10/2013 17:38    Assessment & Plan:   TEvrenwas seen today for hypertension, hyperlipidemia, diabetes and hypothyroidism.  Diagnoses and all orders for this visit:  Diabetic polyneuropathy associated with type 2 diabetes mellitus ([place] -     CMP14+EGFR -      Microalbumin / creatinine urine ratio -     Urinalysis -     Bayer DCA Hb A1c Waived -     Lipid panel -     CBC with Differential/Platelet  Essential hypertension -     CMP14+EGFR  Hyperlipidemia -     CMP14+EGFR -     Lipid panel  Other specified hypothyroidism -     CMP14+EGFR -     TSH + free T4  Dementia, without behavioral disturbance -     CMP14+EGFR  Other orders -     Memantine HCl ER (NAMENDA XR TITRATION PACK) 7 & 14 & 21 &28 MG CP24; Use as directed  I am having Mr. Fluhr start on Memantine HCl ER. I am also having him maintain his multivitamin, aspirin, omeprazole, valsartan, lovastatin, glimepiride, levothyroxine, metoprolol succinate, and etodolac.  Meds ordered this encounter  Medications  . Memantine HCl ER (NAMENDA XR TITRATION PACK) 7 & 14 & 21 &28 MG CP24    Sig: Use as directed    Dispense:  30 capsule  Refill:  0     Follow-up: No Follow-up on file.  Claretta Fraise, M.D.

## 2015-07-03 LAB — CMP14+EGFR
A/G RATIO: 1.4 (ref 1.2–2.2)
ALBUMIN: 3.9 g/dL (ref 3.2–4.6)
ALK PHOS: 51 IU/L (ref 39–117)
ALT: 32 IU/L (ref 0–44)
AST: 30 IU/L (ref 0–40)
BUN / CREAT RATIO: 16 (ref 10–24)
BUN: 17 mg/dL (ref 10–36)
Bilirubin Total: 0.5 mg/dL (ref 0.0–1.2)
CO2: 22 mmol/L (ref 18–29)
CREATININE: 1.07 mg/dL (ref 0.76–1.27)
Calcium: 9.3 mg/dL (ref 8.6–10.2)
Chloride: 99 mmol/L (ref 96–106)
GFR calc Af Amer: 68 mL/min/{1.73_m2} (ref >59)
GFR, EST NON AFRICAN AMERICAN: 59 mL/min/{1.73_m2} — AB (ref >59)
GLOBULIN, TOTAL: 2.7 g/dL (ref 1.5–4.5)
Glucose: 180 mg/dL — ABNORMAL HIGH (ref 65–99)
Potassium: 4.5 mmol/L (ref 3.5–5.2)
SODIUM: 140 mmol/L (ref 134–144)
Total Protein: 6.6 g/dL (ref 6.0–8.5)

## 2015-07-03 LAB — CBC WITH DIFFERENTIAL/PLATELET
BASOS ABS: 0.1 10*3/uL (ref 0.0–0.2)
Basos: 1 %
EOS (ABSOLUTE): 0.2 10*3/uL (ref 0.0–0.4)
Eos: 3 %
Hematocrit: 38.6 % (ref 37.5–51.0)
Hemoglobin: 13.5 g/dL (ref 12.6–17.7)
Immature Grans (Abs): 0 10*3/uL (ref 0.0–0.1)
Immature Granulocytes: 0 %
LYMPHS ABS: 1.9 10*3/uL (ref 0.7–3.1)
Lymphs: 29 %
MCH: 34.5 pg — AB (ref 26.6–33.0)
MCHC: 35 g/dL (ref 31.5–35.7)
MCV: 99 fL — ABNORMAL HIGH (ref 79–97)
MONOS ABS: 0.7 10*3/uL (ref 0.1–0.9)
Monocytes: 11 %
NEUTROS ABS: 3.5 10*3/uL (ref 1.4–7.0)
Neutrophils: 56 %
PLATELETS: 236 10*3/uL (ref 150–379)
RBC: 3.91 x10E6/uL — ABNORMAL LOW (ref 4.14–5.80)
RDW: 13.8 % (ref 12.3–15.4)
WBC: 6.4 10*3/uL (ref 3.4–10.8)

## 2015-07-03 LAB — LIPID PANEL
CHOL/HDL RATIO: 3.8 ratio (ref 0.0–5.0)
Cholesterol, Total: 127 mg/dL (ref 100–199)
HDL: 33 mg/dL — AB (ref >39)
LDL CALC: 34 mg/dL (ref 0–99)
TRIGLYCERIDES: 299 mg/dL — AB (ref 0–149)
VLDL Cholesterol Cal: 60 mg/dL — ABNORMAL HIGH (ref 5–40)

## 2015-07-03 LAB — MICROALBUMIN / CREATININE URINE RATIO
Creatinine, Urine: 97.1 mg/dL
MICROALB/CREAT RATIO: 19.9 mg/g creat (ref 0.0–30.0)
Microalbumin, Urine: 19.3 ug/mL

## 2015-07-03 LAB — TSH+FREE T4
Free T4: 1.26 ng/dL (ref 0.82–1.77)
TSH: 2.96 u[IU]/mL (ref 0.450–4.500)

## 2015-07-08 ENCOUNTER — Other Ambulatory Visit: Payer: Self-pay | Admitting: Family Medicine

## 2015-07-23 ENCOUNTER — Other Ambulatory Visit: Payer: Self-pay | Admitting: Family Medicine

## 2015-07-24 DIAGNOSIS — B351 Tinea unguium: Secondary | ICD-10-CM | POA: Diagnosis not present

## 2015-07-24 DIAGNOSIS — E1142 Type 2 diabetes mellitus with diabetic polyneuropathy: Secondary | ICD-10-CM | POA: Diagnosis not present

## 2015-07-24 DIAGNOSIS — L84 Corns and callosities: Secondary | ICD-10-CM | POA: Diagnosis not present

## 2015-08-12 ENCOUNTER — Other Ambulatory Visit: Payer: Self-pay | Admitting: Family Medicine

## 2015-09-10 ENCOUNTER — Other Ambulatory Visit: Payer: Self-pay | Admitting: Family Medicine

## 2015-09-17 ENCOUNTER — Ambulatory Visit (INDEPENDENT_AMBULATORY_CARE_PROVIDER_SITE_OTHER): Payer: Medicare Other | Admitting: Cardiology

## 2015-09-17 ENCOUNTER — Encounter: Payer: Self-pay | Admitting: Cardiology

## 2015-09-17 VITALS — BP 108/60 | HR 68 | Ht 70.0 in | Wt 194.0 lb

## 2015-09-17 DIAGNOSIS — I2510 Atherosclerotic heart disease of native coronary artery without angina pectoris: Secondary | ICD-10-CM | POA: Diagnosis not present

## 2015-09-17 DIAGNOSIS — I10 Essential (primary) hypertension: Secondary | ICD-10-CM

## 2015-09-17 NOTE — Progress Notes (Signed)
Cardiology Office Note   Date:  09/17/2015   ID:  [name] [name], DOB 1919-03-19, MRN [id number]  PCP:  [name] Fraise, MD  Cardiologist:   [name] Breeding, MD   No chief complaint on file.     History of Present Illness: [name] [name] is a 82 y.o. male who presents for follow up of CAD/CABG.  He was previously seen by Dr. Ron [name].  His bypass was in 1998. Last stress testing was in 2011. He seems to do very well. I saw his wife for the first time today as well. They both live together alone although their children check on him frequently. He still drives rarely. He gets around the house and does a little bit of stuff around town occasionally. With this he denies any cardiovascular symptoms.  The patient denies any new symptoms such as chest discomfort, neck or arm discomfort. There has been no new shortness of breath, PND or orthopnea. There have been no reported palpitations, presyncope or syncope.   Past Medical History  Diagnosis Date  . HTN (hypertension)   . Hyperlipidemia   . CAD (coronary artery disease) 2002    Nuclear, January, 2011, no ischemia  . Diabetic neuropathy ([facility])     Possible  . Respiratory failure ([place])     vent dependent...acquired pneumonia...2008  . Ejection fraction     EF 50-55%, echo, January, 2011, hypokinesis mid--base inferolateral  . Hx of CABG     1998  . Carotid artery disease ([place])     Doppler, February, 2012, 0-39% bilateral  . Shortness of breath     2011    Past Surgical History  Procedure Laterality Date  . Coronary artery bypass graft    . Knee arthroplasty      total  . Carpal tunnel release    . Laparoscopic appendectomy N/A 01/10/2013    Procedure: APPENDECTOMY LAPAROSCOPIC;  Surgeon: Donato Heinz, MD;  Location: [facility];  Service: General;  Laterality: N/A;     Current Outpatient Prescriptions  Medication Sig Dispense Refill  . aspirin 81 MG tablet Take 81 mg by mouth daily.    Marland Kitchen etodolac (LODINE) 200 MG capsule TAKE 2  CAPSULES MONDAY AND THURSDAYS 16 capsule 2  . famotidine (PEPCID AC) 10 MG chewable tablet Chew 10 mg by mouth. Take one tablet by mouth on Mondays and thursdays    . glimepiride (AMARYL) 2 MG tablet TAKE 1 TABLET (2 MG TOTAL) BY MOUTH DAILY BEFORE BREAKFAST. 30 tablet 2  . levothyroxine (SYNTHROID, LEVOTHROID) 50 MCG tablet TAKE 1 TABLET (50 MCG TOTAL) BY MOUTH DAILY. 90 tablet 1  . lovastatin (MEVACOR) 20 MG tablet Take 1 tablet (20 mg total) by mouth at bedtime. 90 tablet 3  . metoprolol succinate (TOPROL-XL) 100 MG 24 hr tablet TAKE 1 TABLET (100 MG TOTAL) BY MOUTH DAILY. TAKE WITH OR IMMEDIATELY FOLLOWING A MEAL. 30 tablet 3  . Multiple Vitamin (MULTIVITAMIN) tablet Take 1 tablet by mouth daily.    . valsartan (DIOVAN) 80 MG tablet Take 1 tablet (80 mg total) by mouth daily. 90 tablet 3   No current facility-administered medications for this visit.    Allergies:   Review of patient's allergies indicates no known allergies.    Social History:  The patient  reports that he has never smoked. He does not have any smokeless tobacco history on file. He reports that he does not drink alcohol or use illicit drugs.   Family History:  The patient's family  history includes Heart attack (age of onset: 37) in his father; Heart disease in his father; Heart failure (age of onset: 14) in his mother.    ROS:  Please see the history of present illness.   Otherwise, review of systems are positive for none.   All other systems are reviewed and negative.    PHYSICAL EXAM: VS:  BP 108/60 mmHg  Pulse 68  Ht 5\' 10"  (1.778 m)  Wt 184 lb (83.462 kg)  BMI 26.40 kg/m2 , BMI Body mass index is 26.4 kg/(m^2). GENERAL:  Well appearing for his age 49:  Pupils equal round and reactive, fundi not visualized, oral mucosa unremarkable NECK:  No jugular venous distention, waveform within normal limits, carotid upstroke brisk and symmetric, no bruits, no thyromegaly LYMPHATICS:  No cervical, inguinal  adenopathy LUNGS:  Clear to auscultation bilaterally BACK:  No CVA tenderness CHEST:  Unremarkable HEART:  PMI not displaced or sustained,S1 and S2 within normal limits, no S3, no S4, no clicks, no rubs, no murmurs ABD:  Flat, positive bowel sounds normal in frequency in pitch, no bruits, no rebound, no guarding, no midline pulsatile mass, no hepatomegaly, no splenomegaly EXT:  2 plus pulses throughout, no edema, no cyanosis no clubbing SKIN:  No rashes no nodules NEURO:  Cranial nerves II through XII grossly intact, motor grossly intact throughout PSYCH:  Cognitively intact, oriented to person place and time    EKG:  EKG is ordered today. The ekg ordered today demonstrates sinus rhythm, rate 68, occasional premature ventricular contractions, no acute ST-T wave changes.   Recent Labs: 07/02/2015: ALT 32; BUN 17; Creatinine, Ser 1.07; Platelets 236; Potassium 4.5; Sodium 140; TSH 2.960    Lipid Panel    Component Value Date/Time   CHOL 127 07/02/2015 1453   TRIG 299* 07/02/2015 1453   HDL 33* 07/02/2015 1453   CHOLHDL 3.8 07/02/2015 1453   LDLCALC 34 07/02/2015 1453     Lab Results  Component Value Date   HGBA1C 6.5 12/09/2014    Wt Readings from Last 3 Encounters:  09/17/15 184 lb (83.462 kg)  07/02/15 199 lb (90.266 kg)  03/12/15 196 lb 9.6 oz (89.177 kg)      Other studies Reviewed: Additional studies/ records that were reviewed today include: none. Review of the above records demonstrates:  Please see elsewhere in the note.     ASSESSMENT AND PLAN:  CAD:  The patient has no ongoing symptoms since stress testing in 2011. Therefore, at this point and not planning on any screening testing. He will continue the meds as listed.  PVCs:  He does not feel these.  No change in therapy is indicated.  HTN:  The blood pressure is at target. No change in medications is indicated. We will continue with therapeutic lifestyle changes (TLC).  DYSLIPIDEMIA:  His lipids are  excellent.  He will continue the meds as listed.   CAROTID STENOSIS:  He had very mild disease with Doppler in 2012.  No further imaging is planned.    DM:  Last A1C was 6.5.  He will continue the meds as listed.    Current medicines are reviewed at length with the patient today.  The patient does not have concerns regarding medicines.  The following changes have been made:  no change  Labs/ tests ordered today include: None  No orders of the defined types were placed in this encounter.     Disposition:   FU with me in one year.  Signed, [name] Breeding, MD  09/17/2015 3:14 PM    [facility]

## 2015-09-17 NOTE — Patient Instructions (Signed)

## 2015-09-27 ENCOUNTER — Other Ambulatory Visit: Payer: Self-pay | Admitting: Family Medicine

## 2015-10-14 ENCOUNTER — Encounter: Payer: Self-pay | Admitting: Family Medicine

## 2015-10-14 ENCOUNTER — Ambulatory Visit (INDEPENDENT_AMBULATORY_CARE_PROVIDER_SITE_OTHER): Payer: Medicare Other | Admitting: Family Medicine

## 2015-10-14 ENCOUNTER — Other Ambulatory Visit: Payer: Self-pay | Admitting: Family Medicine

## 2015-10-14 VITALS — BP 139/64 | HR 65 | Temp 97.5°F | Ht 70.0 in | Wt 191.4 lb

## 2015-10-14 DIAGNOSIS — I2510 Atherosclerotic heart disease of native coronary artery without angina pectoris: Secondary | ICD-10-CM | POA: Diagnosis not present

## 2015-10-14 DIAGNOSIS — I10 Essential (primary) hypertension: Secondary | ICD-10-CM

## 2015-10-14 DIAGNOSIS — E119 Type 2 diabetes mellitus without complications: Secondary | ICD-10-CM

## 2015-10-14 DIAGNOSIS — F0390 Unspecified dementia without behavioral disturbance: Secondary | ICD-10-CM

## 2015-10-14 LAB — BAYER DCA HB A1C WAIVED: HB A1C (BAYER DCA - WAIVED): 6.2 % (ref <7.0)

## 2015-10-14 MED ORDER — DONEPEZIL HCL 5 MG PO TABS: 5.0000 mg | ORAL_TABLET | Freq: Every day | ORAL | Status: DC

## 2015-10-14 NOTE — Progress Notes (Signed)
Subjective:  Patient ID: [name] [name], male    DOB: 1918-08-28  Age: 82 y.o. MRN: [name] [name]  CC: 3 month follow up   HPI [name] [name] presents for Recheck of diabetes. Does not check blood sugar at home and eats about what he wants to. Medications do seem to agree with him well. He could not afford the Namenda X are it was going be over $300. So they have not started any memory based medicine. His daughters with him today and says he seems to be forgetting more. He also has been having some and fibrillation. He mentions driving to [place] with some people and driving back and the money jar that was in the second of a bureau was gone when he got back. He mentions that he is living in a [employer] is pretty much like a house, but that he would like to move into a house. Today some elements of the MMSE were performed. He was able to tell me the place that he is in including [place] and state. He did not know the day or the date. He is unable to repeat 3 objects. He could not count backwards by sevens from 100. He didn't follow multiple step direction.   History Murat has a past medical history of HTN (hypertension); Hyperlipidemia; CAD (coronary artery disease) (2002); Diabetic neuropathy ([place]); Respiratory failure ([place]); Ejection fraction; CABG; Carotid artery disease ([place]); and Shortness of breath.   He has past surgical history that includes Coronary artery bypass graft; Knee Arthroplasty; Carpal tunnel release; and laparoscopic appendectomy (N/A, 01/10/2013).   His family history includes Heart attack (age of onset: 2) in his father; Heart disease in his father; Heart failure (age of onset: 51) in his mother.He reports that he has never smoked. He does not have any smokeless tobacco history on file. He reports that he does not drink alcohol or use illicit drugs.    ROS Review of Systems  Constitutional: Negative for fever, chills, diaphoresis and unexpected weight change.   HENT: Negative for congestion, hearing loss, rhinorrhea and sore throat.   Eyes: Negative for visual disturbance.  Respiratory: Negative for cough and shortness of breath.   Cardiovascular: Negative for chest pain.  Gastrointestinal: Negative for abdominal pain, diarrhea and constipation.  Genitourinary: Negative for dysuria and flank pain.  Musculoskeletal: Negative for joint swelling and arthralgias.  Skin: Negative for rash.  Neurological: Negative for dizziness and headaches.  Psychiatric/Behavioral: Positive for confusion. Negative for sleep disturbance and dysphoric mood. The patient is not nervous/anxious.     Objective:  BP 139/64 mmHg  Pulse 65  Temp(Src) 97.5 F (36.4 C) (Oral)  Ht 5\' 10"  (1.778 m)  Wt 191 lb 6 oz (86.807 kg)  BMI 27.46 kg/m2  BP Readings from Last 3 Encounters:  10/14/15 139/64  09/17/15 108/60  07/02/15 159/63    Wt Readings from Last 3 Encounters:  10/14/15 191 lb 6 oz (86.807 kg)  09/17/15 194 lb (87.998 kg)  07/02/15 199 lb (90.266 kg)     Physical Exam  Constitutional: He is oriented to person, place, and time. He appears well-developed and well-nourished. No distress.  HENT:  Head: Normocephalic and atraumatic.  Right Ear: External ear normal.  Left Ear: External ear normal.  Nose: Nose normal.  Mouth/Throat: Oropharynx is clear and moist.  Eyes: Conjunctivae and EOM are normal. Pupils are equal, round, and reactive to light.  Neck: Normal range of motion. Neck supple. No thyromegaly present.  Cardiovascular:  Normal rate, regular rhythm and normal heart sounds.   No murmur heard. Pulmonary/Chest: Effort normal and breath sounds normal. No respiratory distress. He has no wheezes. He has no rales.  Abdominal: Soft. Bowel sounds are normal. He exhibits no distension. There is no tenderness.  Musculoskeletal: Normal range of motion.  Lymphadenopathy:    He has no cervical adenopathy.  Neurological: He is alert and oriented to person,  place, and time. He has normal reflexes.  Skin: Skin is warm and dry.  Psychiatric: He has a normal mood and affect. His speech is normal and behavior is normal. Thought content is delusional. Cognition and memory are impaired. He exhibits abnormal recent memory and abnormal remote memory.     Lab Results  Component Value Date   WBC 6.4 07/02/2015   HGB 14.3 05/27/2014   HCT 38.6 07/02/2015   PLT 236 07/02/2015   GLUCOSE 180* 07/02/2015   CHOL 127 07/02/2015   TRIG 299* 07/02/2015   HDL 33* 07/02/2015   LDLCALC 34 07/02/2015   ALT 32 07/02/2015   AST 30 07/02/2015   NA 140 07/02/2015   K 4.5 07/02/2015   CL 99 07/02/2015   CREATININE 1.07 07/02/2015   BUN 17 07/02/2015   CO2 22 07/02/2015   TSH 2.960 07/02/2015   INR 1.19 01/10/2013   HGBA1C 6.5 12/09/2014    Ct Abdomen Pelvis W Contrast  01/10/2013  CLINICAL DATA:  right-sided abdominal pain with low-grade fever EXAM: CT ABDOMEN AND PELVIS WITH CONTRAST TECHNIQUE: Multidetector CT imaging of the abdomen and pelvis was performed using the standard protocol following bolus administration of intravenous contrast. Oral contrast was also administered. CONTRAST:  160mL OMNIPAQUE IOHEXOL 300 MG/ML  SOLN COMPARISON:  None. FINDINGS: There is scarring in the anterior lung base. There is extensive coronary artery calcification. There is fatty change in the liver. No focal liver lesions are identified. There is no biliary duct dilatation. Gallbladder wall is not thickened. The spleen, pancreas, and adrenals appear normal. The kidneys bilaterally show no mass or hydronephrosis on either side. In the pelvis, the appendix is dilated with extensive surrounding inflammation consistent with acute appendicitis. There is no well-defined abscess. Elsewhere in the pelvis, there is no mass or fluid collection. Prostate is mildly enlarged. There is no bowel obstruction. No free air or portal venous air. There is no ascites, adenopathy, or abscess in the  abdomen or pelvis. There is extensive atherosclerotic change in the aorta but no aneurysm. There is extensive arthropathy throughout the lumbar spine. There are no blastic or lytic bone lesions. IMPRESSION: Evidence of acute appendicitis. No frank abscess. No bowel obstruction. Fatty liver. Extensive lumbar arthropathy. Extensive atherosclerotic change. Critical Value/emergent results were called by telephone at the time of interpretation on 01/10/2013 at 4:47 PM to Dr.Donald Laurance Flatten , who verbally acknowledged these results. Electronically Signed   By: Lowella Grip M.D.   On: 01/10/2013 16:53   Dg Chest Port 1 View  01/10/2013  CLINICAL DATA:  Initial encounter for current episode of shortness of breath. Current history of coronary artery disease, prior CABG. Current history of hypertension, diabetes, and hyperlipidemia. EXAM: PORTABLE CHEST - 1 VIEW COMPARISON:  Two-view chest x-ray 04/02/2009 and portable chest x-rays 07/12/2006 dating back to 06/30/2006. FINDINGS: Prior sternotomy for CABG. Cardiac silhouette mildly to moderately enlarged but stable, allowing for differences in technique. Pulmonary vascularity normal without evidence of pulmonary edema. Mildly prominent bronchovascular markings diffusely and mild to moderate central peribronchial thickening, more so than on the  prior examination from 03/2009. No confluent airspace consolidation. No pleural effusions. No pneumothorax. IMPRESSION: Mild to moderate changes of acute bronchitis and/or asthma without localized airspace pneumonia. Stable mild to moderate cardiomegaly without pulmonary edema. Electronically Signed   By: Evangeline Dakin M.D.   On: 01/10/2013 17:38    Assessment & Plan:   Yu was seen today for 3 month follow up.  Diagnoses and all orders for this visit:  Diabetes mellitus without complication ([place]) -     Bayer [place] Hb A1c Waived  Essential hypertension  Dementia, without behavioral disturbance  Other orders -      donepezil (ARICEPT) 5 MG tablet; Take 1 tablet (5 mg total) by mouth at bedtime.    A1c is 6.2 today.  I am having Mr. Furno start on donepezil. I am also having him maintain his multivitamin, aspirin, valsartan, lovastatin, levothyroxine, glimepiride, etodolac, metoprolol succinate, and famotidine.  Meds ordered this encounter  Medications  . donepezil (ARICEPT) 5 MG tablet    Sig: Take 1 tablet (5 mg total) by mouth at bedtime.    Dispense:  30 tablet    Refill:  1     Follow-up: Return in about 6 weeks (around 11/25/2015) for memory, diabetes.  Claretta Fraise, M.D.

## 2015-10-29 ENCOUNTER — Other Ambulatory Visit: Payer: Self-pay | Admitting: Family Medicine

## 2015-11-04 DIAGNOSIS — E1142 Type 2 diabetes mellitus with diabetic polyneuropathy: Secondary | ICD-10-CM | POA: Diagnosis not present

## 2015-11-04 DIAGNOSIS — B351 Tinea unguium: Secondary | ICD-10-CM | POA: Diagnosis not present

## 2015-11-04 DIAGNOSIS — L84 Corns and callosities: Secondary | ICD-10-CM | POA: Diagnosis not present

## 2015-11-17 ENCOUNTER — Other Ambulatory Visit: Payer: Self-pay | Admitting: Family Medicine

## 2015-11-17 NOTE — Telephone Encounter (Signed)
Stacks seen 09/2015

## 2015-11-27 ENCOUNTER — Other Ambulatory Visit: Payer: Self-pay | Admitting: *Deleted

## 2015-11-27 ENCOUNTER — Other Ambulatory Visit: Payer: Self-pay | Admitting: Family Medicine

## 2015-11-27 MED ORDER — GLIMEPIRIDE 2 MG PO TABS: ORAL_TABLET | ORAL | 2 refills | Status: DC

## 2015-11-27 MED ORDER — METOPROLOL SUCCINATE ER 100 MG PO TB24: ORAL_TABLET | ORAL | 3 refills | Status: DC

## 2015-12-02 ENCOUNTER — Encounter: Payer: Self-pay | Admitting: Family Medicine

## 2015-12-02 ENCOUNTER — Ambulatory Visit (INDEPENDENT_AMBULATORY_CARE_PROVIDER_SITE_OTHER): Payer: Medicare Other | Admitting: Family Medicine

## 2015-12-02 VITALS — BP 139/53 | HR 54 | Temp 97.1°F | Ht 70.0 in | Wt 191.1 lb

## 2015-12-02 DIAGNOSIS — I2510 Atherosclerotic heart disease of native coronary artery without angina pectoris: Secondary | ICD-10-CM

## 2015-12-02 DIAGNOSIS — F0390 Unspecified dementia without behavioral disturbance: Secondary | ICD-10-CM | POA: Diagnosis not present

## 2015-12-02 NOTE — Progress Notes (Signed)
Subjective:  Patient ID: [name] [name], male    DOB: 02-06-1919  Age: 82 y.o. MRN: [id number]  CC: 6 week recheck (pt was started on Aricept 5mg  6 weeks ago)   HPI [name] [name] presents for Recheck of dementia. His daughter is with him today and says he seems to be confused more. He mentions going to 3 houses in [place] to sleep, but has not left his home overnight. Tolerating the donepezil, but no better. Maybe worse.  History [name] [name] has a past medical history of CAD (coronary artery disease) (2002); Carotid artery disease ([place]); Diabetic neuropathy ([place]); Ejection fraction; HTN (hypertension); CABG; Hyperlipidemia; Respiratory failure ([place]); and Shortness of breath.   He has a past surgical history that includes Coronary artery bypass graft; Knee Arthroplasty; Carpal tunnel release; and laparoscopic appendectomy (N/A, 01/10/2013).   His family history includes Heart attack (age of onset: 68) in his father; Heart disease in his father; Heart failure (age of onset: 74) in his mother.He reports that he has never smoked. He has never used smokeless tobacco. He reports that he does not drink alcohol or use drugs.    ROS Review of Systems  Constitutional: Negative for chills, diaphoresis, fever and unexpected weight change.  HENT: Negative for congestion, hearing loss, rhinorrhea and sore throat.   Eyes: Negative for visual disturbance.  Respiratory: Negative for cough and shortness of breath.   Cardiovascular: Negative for chest pain.  Gastrointestinal: Negative for abdominal pain, constipation and diarrhea.  Genitourinary: Negative for dysuria and flank pain.  Musculoskeletal: Negative for arthralgias and joint swelling.  Skin: Negative for rash.  Neurological: Negative for dizziness and headaches.  Psychiatric/Behavioral: Positive for confusion. Negative for dysphoric mood and sleep disturbance. The patient is not nervous/anxious.     Objective:  BP (!) 139/53   Pulse (!)  54   Temp 97.1 F (36.2 C) (Oral)   Ht 5\' 10"  (1.778 m)   Wt 191 lb 2 oz (86.7 kg)   BMI 27.42 kg/m   BP Readings from Last 3 Encounters:  12/02/15 (!) 139/53  10/14/15 139/64  09/17/15 108/60    Wt Readings from Last 3 Encounters:  12/02/15 191 lb 2 oz (86.7 kg)  10/14/15 191 lb 6 oz (86.8 kg)  09/17/15 194 lb (88 kg)     Physical Exam  Constitutional: He is oriented to person, place, and time. He appears well-developed and well-nourished. No distress.  HENT:  Head: Normocephalic and atraumatic.  Right Ear: External ear normal.  Left Ear: External ear normal.  Nose: Nose normal.  Mouth/Throat: Oropharynx is clear and moist.  Eyes: Conjunctivae and EOM are normal. Pupils are equal, round, and reactive to light.  Neck: Normal range of motion. Neck supple. No thyromegaly present.  Cardiovascular: Normal rate, regular rhythm and normal heart sounds.   No murmur heard. Pulmonary/Chest: Effort normal and breath sounds normal. No respiratory distress. He has no wheezes. He has no rales.  Abdominal: Soft. Bowel sounds are normal. He exhibits no distension. There is no tenderness.  Musculoskeletal: Normal range of motion.  Lymphadenopathy:    He has no cervical adenopathy.  Neurological: He is alert and oriented to person, place, and time. He has normal reflexes.  Skin: Skin is warm and dry.  Psychiatric: He has a normal mood and affect. His speech is normal and behavior is normal. Thought content is delusional. Cognition and memory are impaired. He exhibits abnormal recent memory and abnormal remote memory.    Assessment &  Plan:   [name] [name] was seen today for 6 week recheck.  Diagnoses and all orders for this visit:  Dementia, without behavioral disturbance     Aricept increased to 10 mg   Follow-up: Return in about 6 weeks (around 01/13/2016) for diabetes and dementia.  Claretta Fraise, M.D.

## 2015-12-06 ENCOUNTER — Other Ambulatory Visit: Payer: Self-pay | Admitting: Family Medicine

## 2015-12-10 ENCOUNTER — Other Ambulatory Visit: Payer: Self-pay | Admitting: Family Medicine

## 2015-12-10 MED ORDER — DONEPEZIL HCL 10 MG PO TABS: 10.0000 mg | ORAL_TABLET | Freq: Every day | ORAL | 5 refills | Status: DC

## 2015-12-10 NOTE — Telephone Encounter (Signed)
Pt's daughter notified correct dosage was confirmed with [facility]

## 2015-12-20 ENCOUNTER — Other Ambulatory Visit: Payer: Self-pay | Admitting: Family Medicine

## 2015-12-26 ENCOUNTER — Other Ambulatory Visit: Payer: Self-pay | Admitting: Family Medicine

## 2016-01-14 ENCOUNTER — Encounter: Payer: Self-pay | Admitting: Family Medicine

## 2016-01-14 ENCOUNTER — Ambulatory Visit (INDEPENDENT_AMBULATORY_CARE_PROVIDER_SITE_OTHER): Payer: Medicare Other | Admitting: Family Medicine

## 2016-01-14 VITALS — BP 132/60 | HR 68 | Temp 97.6°F

## 2016-01-14 DIAGNOSIS — I10 Essential (primary) hypertension: Secondary | ICD-10-CM | POA: Diagnosis not present

## 2016-01-14 DIAGNOSIS — E782 Mixed hyperlipidemia: Secondary | ICD-10-CM | POA: Diagnosis not present

## 2016-01-14 DIAGNOSIS — I2510 Atherosclerotic heart disease of native coronary artery without angina pectoris: Secondary | ICD-10-CM | POA: Diagnosis not present

## 2016-01-14 DIAGNOSIS — E1142 Type 2 diabetes mellitus with diabetic polyneuropathy: Secondary | ICD-10-CM | POA: Diagnosis not present

## 2016-01-14 DIAGNOSIS — F0280 Dementia in other diseases classified elsewhere without behavioral disturbance: Secondary | ICD-10-CM

## 2016-01-14 DIAGNOSIS — E038 Other specified hypothyroidism: Secondary | ICD-10-CM

## 2016-01-14 DIAGNOSIS — G301 Alzheimer's disease with late onset: Secondary | ICD-10-CM | POA: Diagnosis not present

## 2016-01-14 HISTORY — DX: Dementia in other diseases classified elsewhere, unspecified severity, without behavioral disturbance, psychotic disturbance, mood disturbance, and anxiety: F02.80

## 2016-01-14 LAB — BAYER DCA HB A1C WAIVED: HB A1C: 6.1 % (ref <7.0)

## 2016-01-14 MED ORDER — ETODOLAC 200 MG PO CAPS: 200.0000 mg | ORAL_CAPSULE | Freq: Every day | ORAL | 2 refills | Status: DC

## 2016-01-14 MED ORDER — MOMETASONE FUROATE 50 MCG/ACT NA SUSP: 2.0000 | Freq: Every day | NASAL | 12 refills | Status: DC

## 2016-01-14 NOTE — Progress Notes (Signed)
Subjective:  Patient ID: [name] [name], male    DOB: 07-09-1918  Age: 82 y.o. MRN: [id number]  CC: Dementia (pt here today for his routine follow up on diabetes and needs bloodwork)   HPI [name] [name] presents for  follow-up of hypertension. Patient has no history of headache chest pain or shortness of breath or recent cough. Patient also denies symptoms of TIA such as numbness weakness lateralizing. Patient checks  blood pressure at home and has not had any elevated readings recently. Patient denies side effects from his medication. States taking it regularly.  Patient also  in for follow-up of elevated cholesterol. Doing well without complaints on current medication. Denies side effects of statin including myalgia and arthralgia and nausea. Also in today for liver function testing. Currently no chest pain, shortness of breath or other cardiovascular related symptoms noted.  Follow-up of diabetes. Not checking glucoses at home. Not following a diabetic diet.  Patient denies symptoms such as polyuria, polydipsia, excessive hunger, nausea No significant hypoglycemic spells noted. Medications as noted below. Taking them regularly without complication/adverse reaction being reported today.   Dementia is stable. No worse now better. Tolerating the Aricept well. History [name] [name] has a past medical history of CAD (coronary artery disease) (2002); Carotid artery disease ([place]); Diabetic neuropathy ([place]); Ejection fraction; HTN (hypertension); CABG; Hyperlipidemia; Respiratory failure ([place]); and Shortness of breath.   He has a past surgical history that includes Coronary artery bypass graft; Knee Arthroplasty; Carpal tunnel release; and laparoscopic appendectomy (N/A, 01/10/2013).   His family history includes Heart attack (age of onset: 3) in his father; Heart disease in his father; Heart failure (age of onset: 54) in his mother.He reports that he has never smoked. He has never used smokeless  tobacco. He reports that he does not drink alcohol or use drugs.  Current Outpatient Prescriptions on File Prior to Visit  Medication Sig Dispense Refill  . aspirin 81 MG tablet Take 81 mg by mouth daily.    Marland Kitchen donepezil (ARICEPT) 10 MG tablet Take 1 tablet (10 mg total) by mouth at bedtime. 30 tablet 5  . famotidine (PEPCID AC) 10 MG chewable tablet Chew 10 mg by mouth. Take one tablet by mouth on Mondays and thursdays    . glimepiride (AMARYL) 2 MG tablet TAKE 1 TABLET (2 MG TOTAL) BY MOUTH DAILY BEFORE BREAKFAST. 30 tablet 2  . levothyroxine (SYNTHROID, LEVOTHROID) 50 MCG tablet TAKE 1 TABLET (50 MCG TOTAL) BY MOUTH DAILY. 90 tablet 2  . lovastatin (MEVACOR) 20 MG tablet TAKE 1 TABLET (20 MG TOTAL) BY MOUTH AT BEDTIME. 90 tablet 0  . metoprolol succinate (TOPROL-XL) 100 MG 24 hr tablet TAKE 1 TABLET (100 MG TOTAL) BY MOUTH DAILY. TAKE WITH OR IMMEDIATELY FOLLOWING A MEAL. 30 tablet 3  . Multiple Vitamin (MULTIVITAMIN) tablet Take 1 tablet by mouth daily.    . valsartan (DIOVAN) 80 MG tablet TAKE 1 TABLET (80 MG TOTAL) BY MOUTH DAILY. 30 tablet 5   No current facility-administered medications on file prior to visit.     ROS Review of Systems  Constitutional: Negative for chills, diaphoresis, fever and unexpected weight change.  HENT: Negative for congestion, hearing loss, rhinorrhea and sore throat.   Eyes: Negative for visual disturbance.  Respiratory: Negative for cough and shortness of breath.   Cardiovascular: Negative for chest pain.  Gastrointestinal: Negative for abdominal pain, constipation and diarrhea.  Genitourinary: Negative for dysuria and flank pain.  Musculoskeletal: Negative for arthralgias and joint swelling.  Skin: Negative for rash.  Neurological: Negative for dizziness and headaches.  Psychiatric/Behavioral: Positive for confusion and decreased concentration. Negative for behavioral problems, dysphoric mood and sleep disturbance. The patient is nervous/anxious.      Objective:  BP 132/60   Pulse 68   Temp 97.6 F (36.4 C) (Oral)   BP Readings from Last 3 Encounters:  01/14/16 132/60  12/02/15 (!) 139/53  10/14/15 139/64    Wt Readings from Last 3 Encounters:  12/02/15 191 lb 2 oz (86.7 kg)  10/14/15 191 lb 6 oz (86.8 kg)  09/17/15 194 lb (88 kg)     Physical Exam  Constitutional: He is oriented to person, place, and time. He appears well-developed and well-nourished. No distress.  HENT:  Head: Normocephalic and atraumatic.  Right Ear: External ear normal.  Left Ear: External ear normal.  Nose: Nose normal.  Mouth/Throat: Oropharynx is clear and moist.  Eyes: Conjunctivae and EOM are normal. Pupils are equal, round, and reactive to light.  Neck: Normal range of motion. Neck supple. No thyromegaly present.  Cardiovascular: Normal rate, regular rhythm and normal heart sounds.   No murmur heard. Pulmonary/Chest: Effort normal and breath sounds normal. No respiratory distress. He has no wheezes. He has no rales.  Abdominal: Soft. Bowel sounds are normal. He exhibits no distension. There is no tenderness.  Lymphadenopathy:    He has no cervical adenopathy.  Neurological: He is alert and oriented to person, place, and time. He has normal reflexes.  Skin: Skin is warm and dry.  Psychiatric: He has a normal mood and affect. His behavior is normal. Judgment and thought content normal.    Lab Results  Component Value Date   HGBA1C 6.5 12/09/2014   HGBA1C 6.6 09/04/2014   HGBA1C 6.4% 05/27/2014    Lab Results  Component Value Date   WBC 6.4 07/02/2015   HGB 14.3 05/27/2014   HCT 38.6 07/02/2015   PLT 236 07/02/2015   GLUCOSE 180 (H) 07/02/2015   CHOL 127 07/02/2015   TRIG 299 (H) 07/02/2015   HDL 33 (L) 07/02/2015   LDLCALC 34 07/02/2015   ALT 32 07/02/2015   AST 30 07/02/2015   NA 140 07/02/2015   K 4.5 07/02/2015   CL 99 07/02/2015   CREATININE 1.07 07/02/2015   BUN 17 07/02/2015   CO2 22 07/02/2015   TSH 2.960  07/02/2015   INR 1.19 01/10/2013   HGBA1C 6.5 12/09/2014    Ct Abdomen Pelvis W Contrast  Result Date: 01/10/2013 CLINICAL DATA:  right-sided abdominal pain with low-grade fever EXAM: CT ABDOMEN AND PELVIS WITH CONTRAST TECHNIQUE: Multidetector CT imaging of the abdomen and pelvis was performed using the standard protocol following bolus administration of intravenous contrast. Oral contrast was also administered. CONTRAST:  118m OMNIPAQUE IOHEXOL 300 MG/ML  SOLN COMPARISON:  None. FINDINGS: There is scarring in the anterior lung base. There is extensive coronary artery calcification. There is fatty change in the liver. No focal liver lesions are identified. There is no biliary duct dilatation. Gallbladder wall is not thickened. The spleen, pancreas, and adrenals appear normal. The kidneys bilaterally show no mass or hydronephrosis on either side. In the pelvis, the appendix is dilated with extensive surrounding inflammation consistent with acute appendicitis. There is no well-defined abscess. Elsewhere in the pelvis, there is no mass or fluid collection. Prostate is mildly enlarged. There is no bowel obstruction. No free air or portal venous air. There is no ascites, adenopathy, or abscess in the abdomen or pelvis. There  is extensive atherosclerotic change in the aorta but no aneurysm. There is extensive arthropathy throughout the lumbar spine. There are no blastic or lytic bone lesions. IMPRESSION: Evidence of acute appendicitis. No frank abscess. No bowel obstruction. Fatty liver. Extensive lumbar arthropathy. Extensive atherosclerotic change. Critical Value/emergent results were called by telephone at the time of interpretation on 01/10/2013 at 4:47 PM to Dr.Donald Laurance Flatten , who verbally acknowledged these results. Electronically Signed   By: Lowella Grip M.D.   On: 01/10/2013 16:53   Dg Chest Port 1 View  Result Date: 01/10/2013 CLINICAL DATA:  Initial encounter for current episode of shortness  of breath. Current history of coronary artery disease, prior CABG. Current history of hypertension, diabetes, and hyperlipidemia. EXAM: PORTABLE CHEST - 1 VIEW COMPARISON:  Two-view chest x-ray 04/02/2009 and portable chest x-rays 07/12/2006 dating back to 06/30/2006. FINDINGS: Prior sternotomy for CABG. Cardiac silhouette mildly to moderately enlarged but stable, allowing for differences in technique. Pulmonary vascularity normal without evidence of pulmonary edema. Mildly prominent bronchovascular markings diffusely and mild to moderate central peribronchial thickening, more so than on the prior examination from 03/2009. No confluent airspace consolidation. No pleural effusions. No pneumothorax. IMPRESSION: Mild to moderate changes of acute bronchitis and/or asthma without localized airspace pneumonia. Stable mild to moderate cardiomegaly without pulmonary edema. Electronically Signed   By: Evangeline Dakin M.D.   On: 01/10/2013 17:38    Assessment & Plan:   B[place] was seen today for dementia.  Diagnoses and all orders for this visit:  Diabetic polyneuropathy associated with type 2 diabetes mellitus ([place]) -     Bayer [place] Hb A1c Waived -     CBC with Differential/Platelet -     CMP14+EGFR -     Lipid panel -     TSH + free T4  Mixed hyperlipidemia -     Lipid panel  Other specified hypothyroidism -     TSH + free T4  Essential hypertension  Late onset Alzheimer's disease without behavioral disturbance  Other orders -     mometasone (NASONEX) 50 MCG/ACT nasal spray; Place 2 sprays into the nose daily. -     etodolac (LODINE) 200 MG capsule; Take 1 capsule (200 mg total) by mouth daily.   I have changed Mr. [name] [name] etodolac. I am also having him start on mometasone. Additionally, I am having him maintain his multivitamin, aspirin, famotidine, valsartan, glimepiride, metoprolol succinate, donepezil, levothyroxine, and lovastatin.  Meds ordered this encounter  Medications  . mometasone  (NASONEX) 50 MCG/ACT nasal spray    Sig: Place 2 sprays into the nose daily.    Dispense:  17 g    Refill:  12  . etodolac (LODINE) 200 MG capsule    Sig: Take 1 capsule (200 mg total) by mouth daily.    Dispense:  16 capsule    Refill:  2     Follow-up: Return in about 3 months (around 04/15/2016).  Claretta Fraise, M.D.

## 2016-01-15 LAB — CMP14+EGFR
A/G RATIO: 1.6 (ref 1.2–2.2)
ALT: 26 IU/L (ref 0–44)
AST: 20 IU/L (ref 0–40)
Albumin: 4 g/dL (ref 3.2–4.6)
Alkaline Phosphatase: 56 IU/L (ref 39–117)
BUN/Creatinine Ratio: 16 (ref 10–24)
BUN: 17 mg/dL (ref 10–36)
Bilirubin Total: 0.4 mg/dL (ref 0.0–1.2)
CALCIUM: 9.6 mg/dL (ref 8.6–10.2)
CO2: 27 mmol/L (ref 18–29)
CREATININE: 1.05 mg/dL (ref 0.76–1.27)
Chloride: 101 mmol/L (ref 96–106)
GFR calc Af Amer: 69 mL/min/{1.73_m2} (ref >59)
GFR, EST NON AFRICAN AMERICAN: 60 mL/min/{1.73_m2} (ref >59)
Globulin, Total: 2.5 g/dL (ref 1.5–4.5)
Glucose: 139 mg/dL — ABNORMAL HIGH (ref 65–99)
POTASSIUM: 4.4 mmol/L (ref 3.5–5.2)
Sodium: 142 mmol/L (ref 134–144)
Total Protein: 6.5 g/dL (ref 6.0–8.5)

## 2016-01-15 LAB — CBC WITH DIFFERENTIAL/PLATELET
Basophils Absolute: 0.1 10*3/uL (ref 0.0–0.2)
Basos: 1 %
EOS (ABSOLUTE): 0.3 10*3/uL (ref 0.0–0.4)
Eos: 3 %
Hematocrit: 39.6 % (ref 37.5–51.0)
Hemoglobin: 13.2 g/dL (ref 12.6–17.7)
IMMATURE GRANULOCYTES: 0 %
Immature Grans (Abs): 0 10*3/uL (ref 0.0–0.1)
Lymphocytes Absolute: 1.9 10*3/uL (ref 0.7–3.1)
Lymphs: 20 %
MCH: 33.5 pg — ABNORMAL HIGH (ref 26.6–33.0)
MCHC: 33.3 g/dL (ref 31.5–35.7)
MCV: 101 fL — ABNORMAL HIGH (ref 79–97)
MONOS ABS: 1 10*3/uL — AB (ref 0.1–0.9)
Monocytes: 11 %
NEUTROS PCT: 65 %
Neutrophils Absolute: 6.2 10*3/uL (ref 1.4–7.0)
PLATELETS: 273 10*3/uL (ref 150–379)
RBC: 3.94 x10E6/uL — AB (ref 4.14–5.80)
RDW: 13.9 % (ref 12.3–15.4)
WBC: 9.4 10*3/uL (ref 3.4–10.8)

## 2016-01-15 LAB — TSH+FREE T4
FREE T4: 1.44 ng/dL (ref 0.82–1.77)
TSH: 3.25 u[IU]/mL (ref 0.450–4.500)

## 2016-01-15 LAB — LIPID PANEL
CHOL/HDL RATIO: 3.4 ratio (ref 0.0–5.0)
CHOLESTEROL TOTAL: 119 mg/dL (ref 100–199)
HDL: 35 mg/dL — AB (ref >39)
LDL Calculated: 27 mg/dL (ref 0–99)
TRIGLYCERIDES: 286 mg/dL — AB (ref 0–149)
VLDL Cholesterol Cal: 57 mg/dL — ABNORMAL HIGH (ref 5–40)

## 2016-01-16 ENCOUNTER — Other Ambulatory Visit: Payer: Self-pay | Admitting: Family Medicine

## 2016-01-16 MED ORDER — FLUTICASONE PROPIONATE 50 MCG/ACT NA SUSP: 2.0000 | Freq: Every day | NASAL | 6 refills | Status: AC

## 2016-01-17 ENCOUNTER — Other Ambulatory Visit: Payer: Self-pay | Admitting: Family Medicine

## 2016-01-19 ENCOUNTER — Telehealth: Payer: Self-pay | Admitting: *Deleted

## 2016-01-19 NOTE — Telephone Encounter (Signed)
-----   Message from Claretta Fraise, MD sent at 01/17/2016 10:22 AM EDT ----- [name] [name], Your blood results looked good overall, however there is an indication of a vitamin B12 deficiency. I recommend that you start taking a high-dose B complex over-the-counter. We should repeat your blood work at your next office visit Best Regards, Claretta Fraise, M.D.

## 2016-02-05 DIAGNOSIS — L84 Corns and callosities: Secondary | ICD-10-CM | POA: Diagnosis not present

## 2016-02-05 DIAGNOSIS — E1142 Type 2 diabetes mellitus with diabetic polyneuropathy: Secondary | ICD-10-CM | POA: Diagnosis not present

## 2016-02-05 DIAGNOSIS — B351 Tinea unguium: Secondary | ICD-10-CM | POA: Diagnosis not present

## 2016-02-26 ENCOUNTER — Other Ambulatory Visit: Payer: Self-pay | Admitting: Family Medicine

## 2016-03-11 ENCOUNTER — Other Ambulatory Visit: Payer: Self-pay | Admitting: Family Medicine

## 2016-03-27 ENCOUNTER — Other Ambulatory Visit: Payer: Self-pay | Admitting: Family Medicine

## 2016-04-03 ENCOUNTER — Other Ambulatory Visit: Payer: Self-pay | Admitting: Family Medicine

## 2016-04-15 ENCOUNTER — Other Ambulatory Visit: Payer: Self-pay | Admitting: Family Medicine

## 2016-04-16 ENCOUNTER — Ambulatory Visit (INDEPENDENT_AMBULATORY_CARE_PROVIDER_SITE_OTHER): Payer: Medicare Other | Admitting: Family Medicine

## 2016-04-16 ENCOUNTER — Encounter: Payer: Self-pay | Admitting: Family Medicine

## 2016-04-16 ENCOUNTER — Encounter (INDEPENDENT_AMBULATORY_CARE_PROVIDER_SITE_OTHER): Payer: Self-pay

## 2016-04-16 VITALS — BP 129/56 | HR 62 | Temp 97.2°F | Ht 70.0 in | Wt 187.0 lb

## 2016-04-16 DIAGNOSIS — E038 Other specified hypothyroidism: Secondary | ICD-10-CM

## 2016-04-16 DIAGNOSIS — E1142 Type 2 diabetes mellitus with diabetic polyneuropathy: Secondary | ICD-10-CM

## 2016-04-16 DIAGNOSIS — E782 Mixed hyperlipidemia: Secondary | ICD-10-CM

## 2016-04-16 DIAGNOSIS — E538 Deficiency of other specified B group vitamins: Secondary | ICD-10-CM | POA: Diagnosis not present

## 2016-04-16 DIAGNOSIS — I10 Essential (primary) hypertension: Secondary | ICD-10-CM

## 2016-04-16 LAB — BAYER DCA HB A1C WAIVED: HB A1C: 5.9 % (ref <7.0)

## 2016-04-16 NOTE — Progress Notes (Signed)
Subjective:  Patient ID: [name] [name], male    DOB: 1918-05-07  Age: 83 y.o. MRN: [id number]  CC: Diabetes (pt here today following up on his diabetes and has been more unsteady recently)   HPI [name] [name] presents forFollow-up of diabetes. Patient does not check blood sugar at home.  Patient denies symptoms such as polyuria, polydipsia, excessive hunger, nausea No significant hypoglycemic spells noted. Medications as noted below. Taking them regularly without complication/adverse reaction being reported today. Checking feet daily.   History [name] [name] has a past medical history of CAD (coronary artery disease) (2002); Carotid artery disease ([place]); Diabetic neuropathy ([place]); Ejection fraction; HTN (hypertension); CABG; Hyperlipidemia; Respiratory failure ([place]); and Shortness of breath.   He has a past surgical history that includes Coronary artery bypass graft; Knee Arthroplasty; Carpal tunnel release; and laparoscopic appendectomy (N/A, 01/10/2013).   His family history includes Heart attack (age of onset: 75) in his father; Heart disease in his father; Heart failure (age of onset: 38) in his mother.He reports that he has never smoked. He has never used smokeless tobacco. He reports that he does not drink alcohol or use drugs.  Current Outpatient Prescriptions on File Prior to Visit  Medication Sig Dispense Refill  . aspirin 81 MG tablet Take 81 mg by mouth daily.    Marland Kitchen donepezil (ARICEPT) 10 MG tablet Take 1 tablet (10 mg total) by mouth at bedtime. 30 tablet 5  . etodolac (LODINE) 200 MG capsule TAKE 1 CAPSULE (200 MG TOTAL) BY MOUTH DAILY. 32 capsule 0  . famotidine (PEPCID AC) 10 MG chewable tablet Chew 10 mg by mouth.     . fluticasone (FLONASE) 50 MCG/ACT nasal spray Place 2 sprays into both nostrils daily. 16 g 6  . glimepiride (AMARYL) 2 MG tablet TAKE 1 TABLET (2 MG TOTAL) BY MOUTH DAILY BEFORE BREAKFAST. 90 tablet 0  . levothyroxine (SYNTHROID, LEVOTHROID) 50 MCG tablet TAKE 1  TABLET (50 MCG TOTAL) BY MOUTH DAILY. 90 tablet 2  . lovastatin (MEVACOR) 20 MG tablet TAKE 1 TABLET (20 MG TOTAL) BY MOUTH AT BEDTIME. 90 tablet 0  . metoprolol succinate (TOPROL-XL) 100 MG 24 hr tablet TAKE 1 TABLET (100 MG TOTAL) BY MOUTH DAILY. TAKE WITH OR IMMEDIATELY FOLLOWING A MEAL. 30 tablet 1  . Multiple Vitamin (MULTIVITAMIN) tablet Take 1 tablet by mouth daily.    . valsartan (DIOVAN) 80 MG tablet TAKE 1 TABLET (80 MG TOTAL) BY MOUTH DAILY. 30 tablet 3   No current facility-administered medications on file prior to visit.     ROS Review of Systems  Constitutional: Negative for chills, diaphoresis, fever and unexpected weight change.  HENT: Negative for congestion, hearing loss, rhinorrhea and sore throat.   Eyes: Negative for visual disturbance.  Respiratory: Negative for cough and shortness of breath.   Cardiovascular: Negative for chest pain.  Gastrointestinal: Negative for abdominal pain, constipation and diarrhea.  Genitourinary: Negative for dysuria and flank pain.  Musculoskeletal: Negative for arthralgias and joint swelling.  Skin: Negative for rash.  Neurological: Negative for dizziness and headaches.  Psychiatric/Behavioral: Negative for dysphoric mood and sleep disturbance.    Objective:  BP (!) 129/56   Pulse 62   Temp 97.2 F (36.2 C) (Oral)   Ht 5' 10"  (1.778 m)   Wt 187 lb (84.8 kg)   BMI 26.83 kg/m   BP Readings from Last 3 Encounters:  04/16/16 (!) 129/56  01/14/16 132/60  12/02/15 (!) 139/53    Wt Readings from Last 3  Encounters:  04/16/16 187 lb (84.8 kg)  12/02/15 191 lb 2 oz (86.7 kg)  10/14/15 191 lb 6 oz (86.8 kg)     Physical Exam  Constitutional: He is oriented to person, place, and time. He appears well-developed and well-nourished. No distress.  HENT:  Head: Normocephalic and atraumatic.  Right Ear: External ear normal.  Left Ear: External ear normal.  Nose: Nose normal.  Mouth/Throat: Oropharynx is clear and moist.  Eyes:  Conjunctivae and EOM are normal. Pupils are equal, round, and reactive to light.  Neck: Normal range of motion. Neck supple. No thyromegaly present.  Cardiovascular: Normal rate, regular rhythm and normal heart sounds.   No murmur heard. Pulmonary/Chest: Effort normal and breath sounds normal. No respiratory distress. He has no wheezes. He has no rales.  Abdominal: Soft. Bowel sounds are normal. He exhibits no distension. There is no tenderness.  Lymphadenopathy:    He has no cervical adenopathy.  Neurological: He is alert and oriented to person, place, and time. He has normal reflexes.  Skin: Skin is warm and dry.  Psychiatric: He has a normal mood and affect. His behavior is normal. Judgment and thought content normal.    Lab Results  Component Value Date   HGBA1C 6.5 12/09/2014   HGBA1C 6.6 09/04/2014   HGBA1C 6.4% 05/27/2014    Lab Results  Component Value Date   WBC 9.4 01/14/2016   HGB 14.3 05/27/2014   HCT 39.6 01/14/2016   PLT 273 01/14/2016   GLUCOSE 139 (H) 01/14/2016   CHOL 119 01/14/2016   TRIG 286 (H) 01/14/2016   HDL 35 (L) 01/14/2016   LDLCALC 27 01/14/2016   ALT 26 01/14/2016   AST 20 01/14/2016   NA 142 01/14/2016   K 4.4 01/14/2016   CL 101 01/14/2016   CREATININE 1.05 01/14/2016   BUN 17 01/14/2016   CO2 27 01/14/2016   TSH 3.250 01/14/2016   INR 1.19 01/10/2013   HGBA1C 6.5 12/09/2014     Assessment & Plan:   [name] [name] was seen today for diabetes.  Diagnoses and all orders for this visit:  Essential hypertension -     CBC with Differential/Platelet -     CMP14+EGFR  Mixed hyperlipidemia -     CBC with Differential/Platelet -     CMP14+EGFR  Other specified hypothyroidism -     CBC with Differential/Platelet -     CMP14+EGFR -     TSH  Diabetic polyneuropathy associated with type 2 diabetes mellitus (HCC) -     CBC with Differential/Platelet -     CMP14+EGFR -     Bayer DCA Hb A1c Waived  Vitamin B12 deficiency -     CBC with  Differential/Platelet -     CMP14+EGFR -     Vitamin B12      I am having [name] [name] maintain his multivitamin, aspirin, famotidine, donepezil, levothyroxine, fluticasone, valsartan, metoprolol succinate, lovastatin, glimepiride, etodolac, and b complex vitamins.  Meds ordered this encounter  Medications  . b complex vitamins capsule    Sig: Take 1 capsule by mouth daily.     Follow-up: Return in about 3 months (around 07/15/2016).  Claretta Fraise, M.D.

## 2016-04-17 LAB — CBC WITH DIFFERENTIAL/PLATELET
BASOS ABS: 0.1 10*3/uL (ref 0.0–0.2)
Basos: 1 %
EOS (ABSOLUTE): 0.2 10*3/uL (ref 0.0–0.4)
Eos: 2 %
Hematocrit: 38.5 % (ref 37.5–51.0)
Hemoglobin: 13 g/dL (ref 13.0–17.7)
Immature Grans (Abs): 0 10*3/uL (ref 0.0–0.1)
Immature Granulocytes: 0 %
LYMPHS ABS: 2.1 10*3/uL (ref 0.7–3.1)
Lymphs: 23 %
MCH: 33.6 pg — ABNORMAL HIGH (ref 26.6–33.0)
MCHC: 33.8 g/dL (ref 31.5–35.7)
MCV: 100 fL — ABNORMAL HIGH (ref 79–97)
MONOS ABS: 0.9 10*3/uL (ref 0.1–0.9)
Monocytes: 10 %
Neutrophils Absolute: 5.9 10*3/uL (ref 1.4–7.0)
Neutrophils: 64 %
PLATELETS: 259 10*3/uL (ref 150–379)
RBC: 3.87 x10E6/uL — AB (ref 4.14–5.80)
RDW: 14.1 % (ref 12.3–15.4)
WBC: 9.2 10*3/uL (ref 3.4–10.8)

## 2016-04-17 LAB — CMP14+EGFR
ALK PHOS: 58 IU/L (ref 39–117)
ALT: 23 IU/L (ref 0–44)
AST: 21 IU/L (ref 0–40)
Albumin/Globulin Ratio: 1.5 (ref 1.2–2.2)
Albumin: 4.1 g/dL (ref 3.2–4.6)
BUN/Creatinine Ratio: 19 (ref 10–24)
BUN: 23 mg/dL (ref 10–36)
Bilirubin Total: 0.4 mg/dL (ref 0.0–1.2)
CALCIUM: 9.7 mg/dL (ref 8.6–10.2)
CO2: 24 mmol/L (ref 18–29)
CREATININE: 1.21 mg/dL (ref 0.76–1.27)
Chloride: 103 mmol/L (ref 96–106)
GFR calc Af Amer: 58 mL/min/{1.73_m2} — ABNORMAL LOW (ref >59)
GFR calc non Af Amer: 51 mL/min/{1.73_m2} — ABNORMAL LOW (ref >59)
GLOBULIN, TOTAL: 2.8 g/dL (ref 1.5–4.5)
GLUCOSE: 67 mg/dL (ref 65–99)
Potassium: 4.4 mmol/L (ref 3.5–5.2)
SODIUM: 142 mmol/L (ref 134–144)
Total Protein: 6.9 g/dL (ref 6.0–8.5)

## 2016-04-17 LAB — VITAMIN B12: Vitamin B-12: 1263 pg/mL — ABNORMAL HIGH (ref 232–1245)

## 2016-04-17 LAB — TSH: TSH: 3.32 u[IU]/mL (ref 0.450–4.500)

## 2016-05-06 ENCOUNTER — Encounter: Payer: Self-pay | Admitting: *Deleted

## 2016-05-06 DIAGNOSIS — B351 Tinea unguium: Secondary | ICD-10-CM | POA: Diagnosis not present

## 2016-05-06 DIAGNOSIS — E1142 Type 2 diabetes mellitus with diabetic polyneuropathy: Secondary | ICD-10-CM | POA: Diagnosis not present

## 2016-05-06 DIAGNOSIS — L84 Corns and callosities: Secondary | ICD-10-CM | POA: Diagnosis not present

## 2016-05-13 ENCOUNTER — Other Ambulatory Visit: Payer: Self-pay | Admitting: Family Medicine

## 2016-05-14 ENCOUNTER — Telehealth: Payer: Self-pay | Admitting: Family Medicine

## 2016-05-14 ENCOUNTER — Other Ambulatory Visit: Payer: Self-pay | Admitting: Family Medicine

## 2016-05-14 NOTE — Telephone Encounter (Signed)
RX sent into [facility] per pt request Okayed per Dr Livia Snellen Pt's daughter notified

## 2016-05-29 ENCOUNTER — Other Ambulatory Visit: Payer: Self-pay | Admitting: Family Medicine

## 2016-06-28 ENCOUNTER — Other Ambulatory Visit: Payer: Self-pay | Admitting: Family Medicine

## 2016-07-01 ENCOUNTER — Other Ambulatory Visit: Payer: Self-pay | Admitting: Family Medicine

## 2016-07-21 ENCOUNTER — Ambulatory Visit (INDEPENDENT_AMBULATORY_CARE_PROVIDER_SITE_OTHER): Payer: Medicare Other | Admitting: Family Medicine

## 2016-07-21 ENCOUNTER — Encounter: Payer: Self-pay | Admitting: Family Medicine

## 2016-07-21 VITALS — BP 153/52 | HR 53 | Temp 96.7°F | Ht 70.0 in | Wt 189.0 lb

## 2016-07-21 DIAGNOSIS — I10 Essential (primary) hypertension: Secondary | ICD-10-CM

## 2016-07-21 DIAGNOSIS — E038 Other specified hypothyroidism: Secondary | ICD-10-CM

## 2016-07-21 DIAGNOSIS — E1142 Type 2 diabetes mellitus with diabetic polyneuropathy: Secondary | ICD-10-CM | POA: Diagnosis not present

## 2016-07-21 DIAGNOSIS — E782 Mixed hyperlipidemia: Secondary | ICD-10-CM

## 2016-07-21 DIAGNOSIS — G301 Alzheimer's disease with late onset: Secondary | ICD-10-CM

## 2016-07-21 DIAGNOSIS — I2510 Atherosclerotic heart disease of native coronary artery without angina pectoris: Secondary | ICD-10-CM

## 2016-07-21 DIAGNOSIS — F0280 Dementia in other diseases classified elsewhere without behavioral disturbance: Secondary | ICD-10-CM

## 2016-07-21 DIAGNOSIS — E538 Deficiency of other specified B group vitamins: Secondary | ICD-10-CM

## 2016-07-21 LAB — URINALYSIS
Bilirubin, UA: POSITIVE — AB
Glucose, UA: NEGATIVE
Ketones, UA: NEGATIVE
LEUKOCYTES UA: NEGATIVE
NITRITE UA: NEGATIVE
PH UA: 5.5 (ref 5.0–7.5)
RBC, UA: NEGATIVE
Specific Gravity, UA: >1.03 — ABNORMAL HIGH (ref 1.005–1.030)
Urobilinogen, Ur: 1 mg/dL (ref 0.2–1.0)

## 2016-07-21 LAB — BAYER DCA HB A1C WAIVED: HB A1C (BAYER DCA - WAIVED): 5.9 % (ref <7.0)

## 2016-07-21 NOTE — Addendum Note (Signed)
Addended by: Wardell Heath on: 07/21/2016 04:15 PM   Modules accepted: Orders

## 2016-07-21 NOTE — Progress Notes (Signed)
Subjective:  Patient ID: [name] [name], male    DOB: Sep 17, 1918  Age: 83 y.o. MRN: [id number]  CC: Annual Exam (pt here today for CPE)   HPI Alexy R Buntin presents for  follow-up of hypertension. Patient has no history of headache chest pain or shortness of breath or recent cough. Patient also denies symptoms of TIA such as numbness weakness lateralizing. Patient checks  blood pressure at home. Recent readings have been adequate Patient denies side effects from medication. States taking it regularly.  Patient also  in for follow-up of elevated cholesterol. Doing well without complaints on current medication. Denies side effects of statin including myalgia and arthralgia and nausea. Also in today for liver function testing. Currently no chest pain, shortness of breath or other cardiovascular related symptoms noted.   Follow-up of diabetes. Patient does not check blood sugar at home. Not following diet.  Patient denies symptoms such as polyuria, polydipsia, excessive hunger, nausea No significant hypoglycemic spells noted. Medications reviewed. Pt reports taking them regularly. Pt. denies complication/adverse reaction today.    History Imad has a past medical history of CAD (coronary artery disease) (2002); Carotid artery disease ([place]); Diabetic neuropathy ([place]); Ejection fraction; HTN (hypertension); CABG; Hyperlipidemia; Respiratory failure ([place]); and Shortness of breath.   He has a past surgical history that includes Coronary artery bypass graft; Knee Arthroplasty; Carpal tunnel release; and laparoscopic appendectomy (N/A, 01/10/2013).   His family history includes Heart attack (age of onset: 59) in his father; Heart disease in his father; Heart failure (age of onset: 34) in his mother.He reports that he has never smoked. He has never used smokeless tobacco. He reports that he does not drink alcohol or use drugs.  Current Outpatient Prescriptions on File Prior to Visit  Medication Sig  Dispense Refill  . aspirin 81 MG tablet Take 81 mg by mouth daily.    Marland Kitchen b complex vitamins capsule Take 1 capsule by mouth daily.    Marland Kitchen donepezil (ARICEPT) 10 MG tablet TAKE 1 TABLET AT BEDTIME 30 tablet 5  . etodolac (LODINE) 200 MG capsule TAKE 1 CAPSULE (200 MG TOTAL) BY MOUTH DAILY. 32 capsule 2  . famotidine (PEPCID AC) 10 MG chewable tablet Chew 10 mg by mouth.     . fluticasone (FLONASE) 50 MCG/ACT nasal spray Place 2 sprays into both nostrils daily. 16 g 6  . glimepiride (AMARYL) 2 MG tablet TAKE 1 TABLET (2 MG TOTAL) BY MOUTH DAILY BEFORE BREAKFAST. 90 tablet 0  . levothyroxine (SYNTHROID, LEVOTHROID) 50 MCG tablet TAKE 1 TABLET (50 MCG TOTAL) BY MOUTH DAILY. 90 tablet 2  . lovastatin (MEVACOR) 20 MG tablet TAKE 1 TABLET (20 MG TOTAL) BY MOUTH AT BEDTIME. 90 tablet 0  . metoprolol succinate (TOPROL-XL) 100 MG 24 hr tablet TAKE 1 TABLET (100 MG TOTAL) BY MOUTH DAILY. TAKE WITH OR IMMEDIATELY FOLLOWING A MEAL. 30 tablet 4  . Multiple Vitamin (MULTIVITAMIN) tablet Take 1 tablet by mouth daily.    . valsartan (DIOVAN) 80 MG tablet TAKE 1 TABLET (80 MG TOTAL) BY MOUTH DAILY. 30 tablet 2   No current facility-administered medications on file prior to visit.     ROS Review of Systems  Constitutional: Negative for chills, diaphoresis, fever and unexpected weight change.  HENT: Negative for congestion, hearing loss, rhinorrhea and sore throat.   Eyes: Negative for visual disturbance.  Respiratory: Negative for cough and shortness of breath.   Cardiovascular: Negative for chest pain.  Gastrointestinal: Negative for abdominal pain, constipation and  diarrhea.  Genitourinary: Negative for dysuria and flank pain.  Musculoskeletal: Negative for arthralgias and joint swelling.  Skin: Negative for rash.  Neurological: Negative for dizziness and headaches.  Psychiatric/Behavioral: Negative for dysphoric mood and sleep disturbance.   Diabetic Foot Form - Detailed   Diabetic Foot Exam -  detailed Diabetic Foot exam was performed with the following findings:  Yes 07/21/2016  3:16 PM  Visual Foot Exam completed.:  Yes  Is there a history of foot ulcer?:  Yes Can the patient see the bottom of their feet?:  No Are the shoes appropriate in style and fit?:  No Is there swelling or and abnormal foot shape?:  Yes Are the toenails long?:  Yes Are the toenails thick?:  Yes Do you have pain in calf while walking?:  No Is there a claw toe deformity?:  Yes Is there elevated skin temparature?:  No Is there foot or ankle muscle weakness?:  Yes Are the toenails ingrown?:  No Normal Range of Motion:  No Pulse Foot Exam completed.:  Yes  Right posterior Tibialias:  Diminished Left posterior Tibialias:  Diminished  Right Dorsalis Pedis:  Present Left Dorsalis Pedis:  Diminished  Sensory Foot Exam Completed.:  Yes Semmes-Weinstein Monofilament Test R Foot Test Control:  Pos L Foot Test Control:  Pos  R Site 1-Great Toe:  Pos L Site 1-Great Toe:  Pos  R Site 4:  Neg L Site 4:  Neg  R Site 5:  Neg L Site 5:  Neg         Objective:  BP (!) 193/62   Pulse (!) 53   Temp (!) 96.7 F (35.9 C) (Oral)   Ht 5' 10"  (1.778 m)   Wt 189 lb (85.7 kg)   BMI 27.12 kg/m   BP Readings from Last 3 Encounters:  07/21/16 (!) 193/62  04/16/16 (!) 129/56  01/14/16 132/60    Wt Readings from Last 3 Encounters:  07/21/16 189 lb (85.7 kg)  04/16/16 187 lb (84.8 kg)  12/02/15 191 lb 2 oz (86.7 kg)     Physical Exam  Constitutional: He appears well-developed and well-nourished. No distress.  HENT:  Head: Normocephalic and atraumatic.  Right Ear: External ear normal.  Left Ear: External ear normal.  Nose: Nose normal.  Mouth/Throat: Oropharynx is clear and moist.  Eyes: Conjunctivae and EOM are normal. Pupils are equal, round, and reactive to light.  Neck: Normal range of motion. Neck supple. No thyromegaly present.  Cardiovascular: Normal rate, regular rhythm and normal heart sounds.    No murmur heard. Pulmonary/Chest: Effort normal and breath sounds normal. No respiratory distress. He has no wheezes. He has no rales.  Abdominal: Soft. Bowel sounds are normal. He exhibits no distension. There is no tenderness.  Lymphadenopathy:    He has no cervical adenopathy.  Neurological: He is alert. He has normal reflexes.  Skin: Skin is warm and dry.  Psychiatric: He has a normal mood and affect. His behavior is normal.      Diabetic Foot Exam - Simple   Simple Foot Form Diabetic Foot exam was performed with the following findings:  Yes 07/21/2016  3:16 PM  Visual Inspection No deformities, no ulcerations, no other skin breakdown bilaterally:  Yes Sensation Testing Intact to touch and monofilament testing bilaterally:  Yes Pulse Check Posterior Tibialis and Dorsalis pulse intact bilaterally:  Yes Comments      Assessment & Plan:   Khyron was seen today for annual exam.  Diagnoses and all  orders for this visit:  Diabetic polyneuropathy associated with type 2 diabetes mellitus ([place]) -     Microalbumin / creatinine urine ratio -     Urinalysis -     Bayer DCA Hb A1c Waived -     CMP14+EGFR -     TSH + free T4  Mixed hyperlipidemia  Essential hypertension -     CMP14+EGFR  Coronary artery disease involving native coronary artery of native heart without angina pectoris  Other specified hypothyroidism -     TSH + free T4  Late onset Alzheimer's disease without behavioral disturbance  Vitamin B12 deficiency   I am having Mr. Lavalle maintain his multivitamin, aspirin, famotidine, levothyroxine, fluticasone, b complex vitamins, metoprolol succinate, etodolac, donepezil, valsartan, lovastatin, and glimepiride.  No orders of the defined types were placed in this encounter. Monitor blood pressure at home 3 times each morning 3 times each afternoon about a minute apart between. Do this for one week. Send the readings back so we can determine if he needs adjustment  to his blood pressure medications Follow-up: Return in about 3 months (around 10/20/2016).  Claretta Fraise, M.D.

## 2016-07-22 LAB — CMP14+EGFR
A/G RATIO: 1.4 (ref 1.2–2.2)
ALBUMIN: 3.8 g/dL (ref 3.2–4.6)
ALT: 23 IU/L (ref 0–44)
AST: 21 IU/L (ref 0–40)
Alkaline Phosphatase: 57 IU/L (ref 39–117)
BILIRUBIN TOTAL: 0.4 mg/dL (ref 0.0–1.2)
BUN / CREAT RATIO: 14 (ref 10–24)
BUN: 19 mg/dL (ref 10–36)
CO2: 25 mmol/L (ref 18–29)
Calcium: 9.1 mg/dL (ref 8.6–10.2)
Chloride: 102 mmol/L (ref 96–106)
Creatinine, Ser: 1.34 mg/dL — ABNORMAL HIGH (ref 0.76–1.27)
GFR, EST AFRICAN AMERICAN: 52 mL/min/{1.73_m2} — AB (ref >59)
GFR, EST NON AFRICAN AMERICAN: 45 mL/min/{1.73_m2} — AB (ref >59)
Globulin, Total: 2.7 g/dL (ref 1.5–4.5)
Glucose: 142 mg/dL — ABNORMAL HIGH (ref 65–99)
POTASSIUM: 4.6 mmol/L (ref 3.5–5.2)
Sodium: 143 mmol/L (ref 134–144)
TOTAL PROTEIN: 6.5 g/dL (ref 6.0–8.5)

## 2016-07-22 LAB — TSH+FREE T4
Free T4: 1.39 ng/dL (ref 0.82–1.77)
TSH: 3.14 u[IU]/mL (ref 0.450–4.500)

## 2016-07-22 LAB — MICROALBUMIN / CREATININE URINE RATIO
Creatinine, Urine: 205.1 mg/dL
Microalb/Creat Ratio: 45.8 mg/g creat — ABNORMAL HIGH (ref 0.0–30.0)
Microalbumin, Urine: 93.9 ug/mL

## 2016-08-05 DIAGNOSIS — M79676 Pain in unspecified toe(s): Secondary | ICD-10-CM | POA: Diagnosis not present

## 2016-08-05 DIAGNOSIS — E1142 Type 2 diabetes mellitus with diabetic polyneuropathy: Secondary | ICD-10-CM | POA: Diagnosis not present

## 2016-08-05 DIAGNOSIS — L84 Corns and callosities: Secondary | ICD-10-CM | POA: Diagnosis not present

## 2016-08-05 DIAGNOSIS — B351 Tinea unguium: Secondary | ICD-10-CM | POA: Diagnosis not present

## 2016-08-13 ENCOUNTER — Other Ambulatory Visit: Payer: Self-pay | Admitting: Family Medicine

## 2016-09-15 ENCOUNTER — Other Ambulatory Visit: Payer: Self-pay | Admitting: Family Medicine

## 2016-09-26 ENCOUNTER — Other Ambulatory Visit: Payer: Self-pay | Admitting: Family Medicine

## 2016-10-05 ENCOUNTER — Other Ambulatory Visit: Payer: Self-pay | Admitting: Family Medicine

## 2016-10-06 ENCOUNTER — Other Ambulatory Visit: Payer: Self-pay | Admitting: Family Medicine

## 2016-10-14 DIAGNOSIS — B351 Tinea unguium: Secondary | ICD-10-CM | POA: Diagnosis not present

## 2016-10-14 DIAGNOSIS — L84 Corns and callosities: Secondary | ICD-10-CM | POA: Diagnosis not present

## 2016-10-14 DIAGNOSIS — E1142 Type 2 diabetes mellitus with diabetic polyneuropathy: Secondary | ICD-10-CM | POA: Diagnosis not present

## 2016-10-22 ENCOUNTER — Encounter: Payer: Self-pay | Admitting: Family Medicine

## 2016-10-22 ENCOUNTER — Ambulatory Visit (INDEPENDENT_AMBULATORY_CARE_PROVIDER_SITE_OTHER): Payer: Medicare Other | Admitting: Family Medicine

## 2016-10-22 VITALS — BP 130/62 | HR 65 | Temp 97.4°F | Ht 70.0 in | Wt 185.0 lb

## 2016-10-22 DIAGNOSIS — E559 Vitamin D deficiency, unspecified: Secondary | ICD-10-CM

## 2016-10-22 DIAGNOSIS — E782 Mixed hyperlipidemia: Secondary | ICD-10-CM

## 2016-10-22 DIAGNOSIS — G301 Alzheimer's disease with late onset: Secondary | ICD-10-CM

## 2016-10-22 DIAGNOSIS — I10 Essential (primary) hypertension: Secondary | ICD-10-CM | POA: Diagnosis not present

## 2016-10-22 DIAGNOSIS — E1142 Type 2 diabetes mellitus with diabetic polyneuropathy: Secondary | ICD-10-CM

## 2016-10-22 DIAGNOSIS — I2510 Atherosclerotic heart disease of native coronary artery without angina pectoris: Secondary | ICD-10-CM

## 2016-10-22 DIAGNOSIS — E038 Other specified hypothyroidism: Secondary | ICD-10-CM

## 2016-10-22 DIAGNOSIS — E538 Deficiency of other specified B group vitamins: Secondary | ICD-10-CM

## 2016-10-22 DIAGNOSIS — F0280 Dementia in other diseases classified elsewhere without behavioral disturbance: Secondary | ICD-10-CM

## 2016-10-22 MED ORDER — OLMESARTAN MEDOXOMIL-HCTZ 20-12.5 MG PO TABS: 1.0000 | ORAL_TABLET | Freq: Every day | ORAL | 5 refills | Status: DC

## 2016-10-22 MED ORDER — FAMOTIDINE 20 MG PO TABS: 20.0000 mg | ORAL_TABLET | Freq: Two times a day (BID) | ORAL | 11 refills | Status: DC

## 2016-10-22 NOTE — Progress Notes (Signed)
Subjective:  Patient ID: [name] [name],  male    DOB: 13-Oct-1918  Age: 83 y.o.    CC: Diabetes (pt here today for routine follow up of his chronic medical conditions.)   HPI [name] [name] presents for  follow-up of hypertension. Patient has no history of headache chest pain or shortness of breath or recent cough. Patient also denies symptoms of TIA such as numbness weakness lateralizing. Patient checks  blood pressure at home. Recent readings have been good Patient denies side effects from medication. States taking it regularly.  Patient also  in for follow-up of elevated cholesterol. Doing well without complaints on current medication. Denies side effects of statin including myalgia and arthralgia and nausea. Also in today for liver function testing. Currently no chest pain, shortness of breath or other cardiovascular related symptoms noted.  Follow-up of diabetes. Patient does not check blood sugar at home. Patient denies symptoms such as polyuria, polydipsia, excessive hunger, nausea No significant hypoglycemic spells noted. Medications reviewed. Pt reports taking them regularly. Pt. denies complication/adverse reaction today.    History [name] [name] has a past medical history of CAD (coronary artery disease) (2002); Carotid artery disease ([place]); Diabetic neuropathy ([place]); Ejection fraction; HTN (hypertension); CABG; Hyperlipidemia; Respiratory failure ([place]); and Shortness of breath.   He has a past surgical history that includes Coronary artery bypass graft; Knee Arthroplasty; Carpal tunnel release; and laparoscopic appendectomy (N/A, 01/10/2013).   His family history includes Heart attack (age of onset: 39) in his father; Heart disease in his father; Heart failure (age of onset: 20) in his mother.He reports that he has never smoked. He has never used smokeless tobacco. He reports that he does not drink alcohol or use drugs.  Current Outpatient Prescriptions on File Prior to Visit    Medication Sig Dispense Refill  . aspirin 81 MG tablet Take 81 mg by mouth daily.    Marland Kitchen b complex vitamins capsule Take 1 capsule by mouth daily.    Marland Kitchen donepezil (ARICEPT) 10 MG tablet TAKE 1 TABLET AT BEDTIME 30 tablet 5  . etodolac (LODINE) 200 MG capsule TAKE 1 CAPSULE (200 MG TOTAL) BY MOUTH DAILY. 32 capsule 5  . fluticasone (FLONASE) 50 MCG/ACT nasal spray Place 2 sprays into both nostrils daily. 16 g 6  . glimepiride (AMARYL) 2 MG tablet TAKE 1 TABLET (2 MG TOTAL) BY MOUTH DAILY BEFORE BREAKFAST. 90 tablet 0  . levothyroxine (SYNTHROID, LEVOTHROID) 50 MCG tablet TAKE 1 TABLET (50 MCG TOTAL) BY MOUTH DAILY. 90 tablet 2  . metoprolol succinate (TOPROL-XL) 100 MG 24 hr tablet TAKE 1 TABLET (100 MG TOTAL) BY MOUTH DAILY. TAKE WITH OR IMMEDIATELY FOLLOWING A MEAL. 30 tablet 3  . Multiple Vitamin (MULTIVITAMIN) tablet Take 1 tablet by mouth daily.     No current facility-administered medications on file prior to visit.     ROS Review of Systems  Constitutional: Negative for chills, diaphoresis, fever and unexpected weight change.  HENT: Negative for congestion, hearing loss, rhinorrhea and sore throat.   Eyes: Negative for visual disturbance.  Respiratory: Negative for cough and shortness of breath.   Cardiovascular: Negative for chest pain.  Gastrointestinal: Negative for abdominal pain, constipation and diarrhea.  Genitourinary: Negative for dysuria and flank pain.  Musculoskeletal: Positive for gait problem (fell on stairs. Took 2-3 weeks to get over pain in bottom. Anothher fall, overbalanced, hit head on rug. had forehead contusion only). Negative for arthralgias and joint swelling.  Skin: Negative for rash.  Neurological: Negative for  dizziness and headaches.  Psychiatric/Behavioral: Negative for dysphoric mood and sleep disturbance.    Objective:  BP (!) 172/62   Pulse 65   Temp (!) 97.4 F (36.3 C) (Oral)   Ht 5' 10"  (1.778 m)   Wt 185 lb (83.9 kg)   BMI 26.54 kg/m    BP Readings from Last 3 Encounters:  10/22/16 (!) 172/62  07/21/16 (!) 153/52  04/16/16 (!) 129/56    Wt Readings from Last 3 Encounters:  10/22/16 185 lb (83.9 kg)  07/21/16 189 lb (85.7 kg)  04/16/16 187 lb (84.8 kg)     Physical Exam  Constitutional: He is oriented to person, place, and time. He appears well-developed and well-nourished. No distress.  HENT:  Head: Normocephalic and atraumatic.  Right Ear: External ear normal.  Left Ear: External ear normal.  Nose: Nose normal.  Mouth/Throat: Oropharynx is clear and moist.  Eyes: Pupils are equal, round, and reactive to light. Conjunctivae and EOM are normal.  Neck: Normal range of motion. Neck supple. No thyromegaly present.  Cardiovascular: Normal rate, regular rhythm and normal heart sounds.   No murmur heard. Pulmonary/Chest: Effort normal and breath sounds normal. No respiratory distress. He has no wheezes. He has no rales.  Abdominal: Soft. Bowel sounds are normal. He exhibits no distension. There is no tenderness.  Lymphadenopathy:    He has no cervical adenopathy.  Neurological: He is alert and oriented to person, place, and time. He has normal reflexes.  Skin: Skin is warm and dry.  Psychiatric: He has a normal mood and affect. His behavior is normal. Thought content normal. Cognition and memory are impaired.    Diabetic Foot Exam - Simple   No data filed        Assessment & Plan:   [name] [name] was seen today for diabetes.  Diagnoses and all orders for this visit:  Essential hypertension -     CMP14+EGFR  Mixed hyperlipidemia -     CMP14+EGFR -     Lipid panel  Coronary artery disease involving native coronary artery of native heart without angina pectoris -     CMP14+EGFR  Diabetic polyneuropathy associated with type 2 diabetes mellitus (HCC) -     Bayer DCA Hb A1c Waived -     CBC with Differential/Platelet -     CMP14+EGFR -     Lipid panel -     Vitamin B12 -     VITAMIN D 25 Hydroxy (Vit-D  Deficiency, Fractures)  Other specified hypothyroidism -     CMP14+EGFR  Late onset Alzheimer's disease without behavioral disturbance -     CMP14+EGFR  Vitamin B12 deficiency -     CBC with Differential/Platelet -     CMP14+EGFR -     Vitamin B12  Vitamin D deficiency -     VITAMIN D 25 Hydroxy (Vit-D Deficiency, Fractures)  Other orders -     olmesartan-hydrochlorothiazide (BENICAR HCT) 20-12.5 MG tablet; Take 1 tablet by mouth daily. -     famotidine (PEPCID) 20 MG tablet; Take 1 tablet (20 mg total) by mouth 2 (two) times daily.   I have discontinued Mr. Cress famotidine, valsartan, and lovastatin. I am also having him start on olmesartan-hydrochlorothiazide and famotidine. Additionally, I am having him maintain his multivitamin, aspirin, fluticasone, b complex vitamins, donepezil, etodolac, levothyroxine, metoprolol succinate, and glimepiride.  Meds ordered this encounter  Medications  . olmesartan-hydrochlorothiazide (BENICAR HCT) 20-12.5 MG tablet    Sig: Take 1 tablet by mouth daily.  Dispense:  30 tablet    Refill:  5  . famotidine (PEPCID) 20 MG tablet    Sig: Take 1 tablet (20 mg total) by mouth 2 (two) times daily.    Dispense:  60 tablet    Refill:  11     Follow-up: No Follow-up on file.  Claretta Fraise, M.D.

## 2016-10-22 NOTE — Patient Instructions (Signed)
Medications Discontinued During This Encounter  Medication Reason  . lovastatin (MEVACOR) 20 MG tablet   . famotidine (PEPCID AC) 10 MG chewable tablet   . valsartan (DIOVAN) 80 MG tablet Not available

## 2016-10-23 ENCOUNTER — Other Ambulatory Visit: Payer: Self-pay | Admitting: Family Medicine

## 2016-10-23 LAB — CBC WITH DIFFERENTIAL/PLATELET
BASOS ABS: 0.1 10*3/uL (ref 0.0–0.2)
Basos: 1 %
EOS (ABSOLUTE): 0.2 10*3/uL (ref 0.0–0.4)
Eos: 2 %
Hematocrit: 37.8 % (ref 37.5–51.0)
Hemoglobin: 12.5 g/dL — ABNORMAL LOW (ref 13.0–17.7)
IMMATURE GRANS (ABS): 0 10*3/uL (ref 0.0–0.1)
Immature Granulocytes: 0 %
LYMPHS: 22 %
Lymphocytes Absolute: 1.9 10*3/uL (ref 0.7–3.1)
MCH: 34.1 pg — ABNORMAL HIGH (ref 26.6–33.0)
MCHC: 33.1 g/dL (ref 31.5–35.7)
MCV: 103 fL — ABNORMAL HIGH (ref 79–97)
Monocytes Absolute: 1 10*3/uL — ABNORMAL HIGH (ref 0.1–0.9)
Monocytes: 11 %
NEUTROS ABS: 5.5 10*3/uL (ref 1.4–7.0)
NEUTROS PCT: 64 %
Platelets: 270 10*3/uL (ref 150–379)
RBC: 3.67 x10E6/uL — AB (ref 4.14–5.80)
RDW: 14.6 % (ref 12.3–15.4)
WBC: 8.7 10*3/uL (ref 3.4–10.8)

## 2016-10-23 LAB — CMP14+EGFR
A/G RATIO: 1.6 (ref 1.2–2.2)
ALT: 26 IU/L (ref 0–44)
AST: 22 IU/L (ref 0–40)
Albumin: 3.9 g/dL (ref 3.2–4.6)
Alkaline Phosphatase: 80 IU/L (ref 39–117)
BILIRUBIN TOTAL: 0.5 mg/dL (ref 0.0–1.2)
BUN/Creatinine Ratio: 13 (ref 10–24)
BUN: 16 mg/dL (ref 10–36)
CHLORIDE: 101 mmol/L (ref 96–106)
CO2: 24 mmol/L (ref 20–29)
Calcium: 9.2 mg/dL (ref 8.6–10.2)
Creatinine, Ser: 1.19 mg/dL (ref 0.76–1.27)
GFR calc non Af Amer: 51 mL/min/{1.73_m2} — ABNORMAL LOW (ref >59)
GFR, EST AFRICAN AMERICAN: 59 mL/min/{1.73_m2} — AB (ref >59)
GLUCOSE: 146 mg/dL — AB (ref 65–99)
Globulin, Total: 2.5 g/dL (ref 1.5–4.5)
POTASSIUM: 4.2 mmol/L (ref 3.5–5.2)
Sodium: 142 mmol/L (ref 134–144)
Total Protein: 6.4 g/dL (ref 6.0–8.5)

## 2016-10-23 LAB — LIPID PANEL
CHOLESTEROL TOTAL: 110 mg/dL (ref 100–199)
Chol/HDL Ratio: 2.6 ratio (ref 0.0–5.0)
HDL: 43 mg/dL (ref >39)
LDL Calculated: 33 mg/dL (ref 0–99)
Triglycerides: 172 mg/dL — ABNORMAL HIGH (ref 0–149)
VLDL CHOLESTEROL CAL: 34 mg/dL (ref 5–40)

## 2016-10-23 LAB — VITAMIN B12: VITAMIN B 12: 1318 pg/mL — AB (ref 232–1245)

## 2016-10-23 LAB — VITAMIN D 25 HYDROXY (VIT D DEFICIENCY, FRACTURES): Vit D, 25-Hydroxy: 34.3 ng/mL (ref 30.0–100.0)

## 2016-10-25 LAB — BAYER DCA HB A1C WAIVED: HB A1C: 5.7 % (ref <7.0)

## 2016-11-01 ENCOUNTER — Encounter: Payer: Self-pay | Admitting: Cardiology

## 2016-11-16 NOTE — Progress Notes (Signed)
Cardiology Office Note   Date:  11/17/2016   ID:  [name] [name], DOB 04-21-1918, MRN [id number]  PCP:  [name] Fraise, MD  Cardiologist:   [name] Breeding, MD   Chief Complaint  Patient presents with  . Coronary Artery Disease      History of Present Illness: [name] [name] is a 83 y.o. male who presents for follow up of CAD/CABG.  He was previously seen by Dr. Ron Parker.  His bypass was in 1998. Last stress testing was in 2011.  Since I last saw him he has done well.  The patient denies any new symptoms such as chest discomfort, neck or arm discomfort. There has been no new shortness of breath, PND or orthopnea. There have been no reported palpitations, presyncope or syncope.  He did fall and injured his tailbone.  He is walking with a walker.    Past Medical History:  Diagnosis Date  . CAD (coronary artery disease) 2002   Nuclear, January, 2011, no ischemia  . Carotid artery disease ([place])    Doppler, February, 2012, 0-39% bilateral  . Diabetic neuropathy ([place])    Possible  . Ejection fraction    EF 50-55%, echo, January, 2011, hypokinesis mid--base inferolateral  . HTN (hypertension)   . Hx of CABG    1998  . Hyperlipidemia   . Respiratory failure ([place])    vent dependent...acquired pneumonia...2008  . Shortness of breath    2011    Past Surgical History:  Procedure Laterality Date  . CARPAL TUNNEL RELEASE    . CORONARY ARTERY BYPASS GRAFT    . KNEE ARTHROPLASTY     total  . LAPAROSCOPIC APPENDECTOMY N/A 01/10/2013   Procedure: APPENDECTOMY LAPAROSCOPIC;  Surgeon: Donato Heinz, MD;  Location: [facility];  Service: General;  Laterality: N/A;     Current Outpatient Prescriptions  Medication Sig Dispense Refill  . aspirin 81 MG tablet Take 81 mg by mouth daily.    Marland Kitchen b complex vitamins capsule Take 1 capsule by mouth every other day.     . donepezil (ARICEPT) 10 MG tablet TAKE 1 TABLET AT BEDTIME 30 tablet 5  . etodolac (LODINE) 200 MG capsule TAKE 1 CAPSULE (200 MG  TOTAL) BY MOUTH DAILY. 32 capsule 5  . famotidine (PEPCID) 20 MG tablet Take 1 tablet (20 mg total) by mouth 2 (two) times daily. 60 tablet 11  . fluticasone (FLONASE) 50 MCG/ACT nasal spray Place 2 sprays into both nostrils daily. 16 g 6  . glimepiride (AMARYL) 2 MG tablet TAKE 1 TABLET (2 MG TOTAL) BY MOUTH DAILY BEFORE BREAKFAST. 90 tablet 0  . levothyroxine (SYNTHROID, LEVOTHROID) 50 MCG tablet TAKE 1 TABLET (50 MCG TOTAL) BY MOUTH DAILY. 90 tablet 2  . metoprolol succinate (TOPROL-XL) 100 MG 24 hr tablet TAKE 1 TABLET (100 MG TOTAL) BY MOUTH DAILY. TAKE WITH OR IMMEDIATELY FOLLOWING A MEAL. 30 tablet 3  . Multiple Vitamin (MULTIVITAMIN) tablet Take 1 tablet by mouth daily.    Marland Kitchen olmesartan-hydrochlorothiazide (BENICAR HCT) 20-12.5 MG tablet Take 1 tablet by mouth daily. 30 tablet 5   No current facility-administered medications for this visit.     Allergies:   Patient has no known allergies.     ROS:  Please see the history of present illness.   Otherwise, review of systems are positive for none.   All other systems are reviewed and negative.    PHYSICAL EXAM: VS:  BP 110/60   Pulse 63   Ht 5'  10" (1.778 m)   Wt 184 lb (83.5 kg)   BMI 26.40 kg/m  , BMI Body mass index is 26.4 kg/m.  GENERAL:  Well appearing NECK:  No jugular venous distention, waveform within normal limits, carotid upstroke brisk and symmetric, no bruits, no thyromegaly LUNGS:  Clear to auscultation bilaterally BACK:  No CVA tenderness CHEST:  Unremarkable HEART:  PMI not displaced or sustained,S1 and S2 within normal limits, no S3, no S4, no clicks, no rubs, no murmurs ABD:  Flat, positive bowel sounds normal in frequency in pitch, no bruits, no rebound, no guarding, no midline pulsatile mass, no hepatomegaly, no splenomegaly EXT:  2 plus pulses throughout, no edema, no cyanosis no clubbing   EKG:  EKG is 63 ordered today. The ekg ordered today demonstrates sinus rhythm, occasional premature ventricular  contractions, no acute ST-T wave changes.  IVCD   Recent Labs: 07/21/2016: TSH 3.140 10/22/2016: ALT 26; BUN 16; Creatinine, Ser 1.19; Hemoglobin 12.5; Platelets 270; Potassium 4.2; Sodium 142    Lipid Panel    Component Value Date/Time   CHOL 110 10/22/2016 1428   TRIG 172 (H) 10/22/2016 1428   HDL 43 10/22/2016 1428   CHOLHDL 2.6 10/22/2016 1428   LDLCALC 33 10/22/2016 1428       Wt Readings from Last 3 Encounters:  11/17/16 184 lb (83.5 kg)  10/22/16 185 lb (83.9 kg)  07/21/16 189 lb (85.7 kg)      Other studies Reviewed: Additional studies/ records that were reviewed today include: Labs. Review of the above records demonstrates:  Please see elsewhere in the note.     ASSESSMENT AND PLAN:  CAD:  The patient has no ongoing symptoms since stress testing in 2011.  Given this no further testing is indicated.    PVCs:   He is not feeling these.  They were noted in the past.  No change in therapy.  HTN:  The blood pressure is at target. No change in medications is indicated. We will continue with therapeutic lifestyle changes (TLC).  DYSLIPIDEMIA:  His Mevacor was stopped and I agree with this.  Less is more at this age.   CAROTID STENOSIS:  He had very mild disease with Doppler in 2012.  No change in therapy is indicated.   DM:  Last A1C was 5.7.  No change in meds.   Current medicines are reviewed at length with the patient today.  The patient does not have concerns regarding medicines.  The following changes have been made:  None  Labs/ tests ordered today include: None  Orders Placed This Encounter  Procedures  . EKG 12-Lead     Disposition:   FU with me in one year.     Signed, [name] Breeding, MD  11/17/2016 5:05 PM    [place]

## 2016-11-17 ENCOUNTER — Ambulatory Visit (INDEPENDENT_AMBULATORY_CARE_PROVIDER_SITE_OTHER): Payer: Medicare Other | Admitting: Cardiology

## 2016-11-17 ENCOUNTER — Encounter: Payer: Self-pay | Admitting: Cardiology

## 2016-11-17 VITALS — BP 110/60 | HR 63 | Ht 70.0 in | Wt 184.0 lb

## 2016-11-17 DIAGNOSIS — I10 Essential (primary) hypertension: Secondary | ICD-10-CM | POA: Diagnosis not present

## 2016-11-17 DIAGNOSIS — I2510 Atherosclerotic heart disease of native coronary artery without angina pectoris: Secondary | ICD-10-CM | POA: Diagnosis not present

## 2016-11-17 NOTE — Patient Instructions (Signed)

## 2016-11-20 ENCOUNTER — Other Ambulatory Visit: Payer: Self-pay | Admitting: Family Medicine

## 2016-12-23 DIAGNOSIS — L02612 Cutaneous abscess of left foot: Secondary | ICD-10-CM | POA: Diagnosis not present

## 2016-12-23 DIAGNOSIS — E1142 Type 2 diabetes mellitus with diabetic polyneuropathy: Secondary | ICD-10-CM | POA: Diagnosis not present

## 2017-01-05 ENCOUNTER — Other Ambulatory Visit: Payer: Self-pay | Admitting: Family Medicine

## 2017-01-13 DIAGNOSIS — L97521 Non-pressure chronic ulcer of other part of left foot limited to breakdown of skin: Secondary | ICD-10-CM | POA: Diagnosis not present

## 2017-01-25 ENCOUNTER — Ambulatory Visit (INDEPENDENT_AMBULATORY_CARE_PROVIDER_SITE_OTHER): Payer: Medicare Other

## 2017-01-25 DIAGNOSIS — Z23 Encounter for immunization: Secondary | ICD-10-CM

## 2017-01-31 ENCOUNTER — Other Ambulatory Visit: Payer: Self-pay | Admitting: Family Medicine

## 2017-02-20 ENCOUNTER — Other Ambulatory Visit: Payer: Self-pay | Admitting: Family Medicine

## 2017-03-02 ENCOUNTER — Other Ambulatory Visit: Payer: Self-pay | Admitting: Family Medicine

## 2017-03-13 ENCOUNTER — Other Ambulatory Visit: Payer: Self-pay | Admitting: Family Medicine

## 2017-04-01 ENCOUNTER — Other Ambulatory Visit: Payer: Self-pay | Admitting: Family Medicine

## 2017-04-04 ENCOUNTER — Other Ambulatory Visit: Payer: Self-pay | Admitting: Family Medicine

## 2017-04-04 NOTE — Telephone Encounter (Signed)
Authorize 30 days only. Then contact the patient letting them know that they will need an appointment before any further prescriptions can be sent in. 

## 2017-04-04 NOTE — Telephone Encounter (Signed)
Last seen 10/22/16  Dr Livia Snellen

## 2017-04-04 NOTE — Telephone Encounter (Signed)
Last seen 10/22/16

## 2017-04-04 NOTE — Telephone Encounter (Signed)
Pt given appt 05/03/17 at 2:55 with Dr Livia Snellen.

## 2017-04-07 DIAGNOSIS — E1151 Type 2 diabetes mellitus with diabetic peripheral angiopathy without gangrene: Secondary | ICD-10-CM | POA: Diagnosis not present

## 2017-04-07 DIAGNOSIS — M79676 Pain in unspecified toe(s): Secondary | ICD-10-CM | POA: Diagnosis not present

## 2017-04-07 DIAGNOSIS — B351 Tinea unguium: Secondary | ICD-10-CM | POA: Diagnosis not present

## 2017-04-07 DIAGNOSIS — L84 Corns and callosities: Secondary | ICD-10-CM | POA: Diagnosis not present

## 2017-04-19 ENCOUNTER — Other Ambulatory Visit: Payer: Self-pay | Admitting: Family Medicine

## 2017-05-01 ENCOUNTER — Other Ambulatory Visit: Payer: Self-pay | Admitting: Family Medicine

## 2017-05-03 ENCOUNTER — Ambulatory Visit (INDEPENDENT_AMBULATORY_CARE_PROVIDER_SITE_OTHER): Payer: Medicare Other | Admitting: Family Medicine

## 2017-05-03 ENCOUNTER — Encounter: Payer: Self-pay | Admitting: Family Medicine

## 2017-05-03 VITALS — BP 128/51 | HR 56 | Temp 97.0°F | Ht 70.0 in | Wt 180.0 lb

## 2017-05-03 DIAGNOSIS — E1142 Type 2 diabetes mellitus with diabetic polyneuropathy: Secondary | ICD-10-CM | POA: Diagnosis not present

## 2017-05-03 DIAGNOSIS — G301 Alzheimer's disease with late onset: Secondary | ICD-10-CM

## 2017-05-03 DIAGNOSIS — N400 Enlarged prostate without lower urinary tract symptoms: Secondary | ICD-10-CM

## 2017-05-03 DIAGNOSIS — E038 Other specified hypothyroidism: Secondary | ICD-10-CM

## 2017-05-03 DIAGNOSIS — F0280 Dementia in other diseases classified elsewhere without behavioral disturbance: Secondary | ICD-10-CM

## 2017-05-03 DIAGNOSIS — E782 Mixed hyperlipidemia: Secondary | ICD-10-CM | POA: Diagnosis not present

## 2017-05-03 DIAGNOSIS — I10 Essential (primary) hypertension: Secondary | ICD-10-CM | POA: Diagnosis not present

## 2017-05-03 MED ORDER — LEVOTHYROXINE SODIUM 50 MCG PO TABS: ORAL_TABLET | ORAL | 2 refills | Status: DC

## 2017-05-03 MED ORDER — OLMESARTAN MEDOXOMIL-HCTZ 20-12.5 MG PO TABS: 1.0000 | ORAL_TABLET | Freq: Every day | ORAL | 1 refills | Status: DC

## 2017-05-03 MED ORDER — DONEPEZIL HCL 10 MG PO TABS: ORAL_TABLET | ORAL | 1 refills | Status: DC

## 2017-05-03 MED ORDER — METOPROLOL SUCCINATE ER 100 MG PO TB24: ORAL_TABLET | ORAL | 1 refills | Status: DC

## 2017-05-03 NOTE — Progress Notes (Signed)
Subjective:  Patient ID: [name] [name],  male    DOB: [birth year]  Age: 84 y.o.    CC: Diabetes (pt here today for routine follow up of his chronic medical conditions)   HPI Tysean R Milsap presents for  follow-up of hypertension. Patient has no history of headache chest pain or shortness of breath or recent cough. Patient also denies symptoms of TIA such as numbness weakness lateralizing. Patient denies side effects from medication. States taking it regularly.  Patient also  in for follow-up of elevated cholesterol. Doing well without complaints on current medication. Denies side effects of statin including myalgia and arthralgia and nausea. Also in today for liver function testing. Currently no chest pain, shortness of breath or other cardiovascular related symptoms noted.  Follow-up of diabetes. Patient does not checking blood sugar due to age and previous conversations we decided that his A1c will be enough.  Additionally he is not following a specific diet although he is eating a bit less than he used to. Patient denies symptoms such as polyuria, polydipsia, excessive hunger, nausea No significant hypoglycemic spells noted. Medications reviewed. Pt reports taking them regularly. Pt. denies complication/adverse reaction today.    History Randell has a past medical history of CAD (coronary artery disease) (2002), Carotid artery disease ([place]), Diabetic neuropathy ([place]), Ejection fraction, HTN (hypertension), CABG, Hyperlipidemia, Respiratory failure ([place]), and Shortness of breath.   He has a past surgical history that includes Coronary artery bypass graft; Knee Arthroplasty; Carpal tunnel release; and laparoscopic appendectomy (N/A, 01/10/2013).   His family history includes Heart attack (age of onset: 82) in his father; Heart disease in his father; Heart failure (age of onset: 39) in his mother.He reports that  has never smoked. he has never used smokeless tobacco. He reports that he does  not drink alcohol or use drugs.  Current Outpatient Medications on File Prior to Visit  Medication Sig Dispense Refill  . aspirin 81 MG tablet Take 81 mg by mouth daily.    Marland Kitchen b complex vitamins capsule Take 1 capsule by mouth every other day.     . etodolac (LODINE) 200 MG capsule TAKE 1 CAPSULE (200 MG TOTAL) BY MOUTH DAILY. 32 capsule 0  . famotidine (PEPCID) 20 MG tablet Take 1 tablet (20 mg total) by mouth 2 (two) times daily. 60 tablet 11  . fluticasone (FLONASE) 50 MCG/ACT nasal spray Place 2 sprays into both nostrils daily. 16 g 6  . glimepiride (AMARYL) 2 MG tablet TAKE 1 TABLET (2 MG TOTAL) BY MOUTH DAILY BEFORE BREAKFAST. 90 tablet 0  . Multiple Vitamin (MULTIVITAMIN) tablet Take 1 tablet by mouth daily.     No current facility-administered medications on file prior to visit.     ROS Review of Systems  Constitutional: Negative for chills, diaphoresis, fever and unexpected weight change.  HENT: Negative for congestion, hearing loss, rhinorrhea and sore throat.   Eyes: Negative for visual disturbance.  Respiratory: Negative for cough and shortness of breath.   Cardiovascular: Negative for chest pain.  Gastrointestinal: Negative for abdominal pain, constipation and diarrhea.  Genitourinary: Negative for dysuria and flank pain.  Musculoskeletal: Negative for arthralgias and joint swelling.  Skin: Negative for rash.  Neurological: Negative for dizziness and headaches.  Psychiatric/Behavioral: Positive for confusion (Patient is actually stable, he just seems to get more confused in the evening.  His daughter refers to his sundowning.  He just keeps wanting to go back to that other house where they live.  He is  not aggressive in any way.) and decreased concentration. Negative for dysphoric mood and sleep disturbance.    Objective:  BP (!) 128/51   Pulse (!) 56   Temp (!) 97 F (36.1 C) (Oral)   Ht 5' 10"  (1.778 m)   Wt 180 lb (81.6 kg)   BMI 25.83 kg/m   BP Readings from  Last 3 Encounters:  05/03/17 (!) 128/51  11/17/16 110/60  10/22/16 130/62    Wt Readings from Last 3 Encounters:  05/03/17 180 lb (81.6 kg)  11/17/16 184 lb (83.5 kg)  10/22/16 185 lb (83.9 kg)     Physical Exam  Constitutional: He appears well-developed and well-nourished. No distress.  HENT:  Head: Normocephalic and atraumatic.  Right Ear: External ear normal.  Left Ear: External ear normal.  Nose: Nose normal.  Mouth/Throat: Oropharynx is clear and moist.  Eyes: Conjunctivae and EOM are normal. Pupils are equal, round, and reactive to light.  Neck: Normal range of motion. Neck supple. No thyromegaly present.  Cardiovascular: Normal rate, regular rhythm and normal heart sounds.  No murmur heard. Pulmonary/Chest: Effort normal and breath sounds normal. No respiratory distress. He has no wheezes. He has no rales.  Abdominal: Soft. Bowel sounds are normal. He exhibits no distension. There is no tenderness.  Lymphadenopathy:    He has no cervical adenopathy.  Neurological: He is alert. He has normal reflexes. No cranial nerve deficit. He exhibits normal muscle tone. Coordination normal.  Skin: Skin is warm and dry.  Psychiatric: He has a normal mood and affect. His behavior is normal. Judgment and thought content normal.    Diabetic foot exam deferred since he is seeing Dr. Steffanie Rainwater, podiatry regularly.    Assessment & Plan:   Monico was seen today for diabetes.  Diagnoses and all orders for this visit:  Essential hypertension -     CBC with Differential/Platelet -     CMP14+EGFR  Mixed hyperlipidemia -     Lipid panel  Late onset Alzheimer's disease without behavioral disturbance  Diabetic polyneuropathy associated with type 2 diabetes mellitus (HCC) -     Bayer DCA Hb A1c Waived  Other specified hypothyroidism -     TSH -     T4, Free  Benign prostatic hyperplasia, unspecified whether lower urinary tract symptoms present -     PSA, total and  free  Other orders -     donepezil (ARICEPT) 10 MG tablet; TAKE 1 TABLET BY MOUTH EVERYDAY AT BEDTIME -     levothyroxine (SYNTHROID, LEVOTHROID) 50 MCG tablet; TAKE 1 TABLET (50 MCG TOTAL) BY MOUTH DAILY. -     metoprolol succinate (TOPROL-XL) 100 MG 24 hr tablet; TAKE 1 TABLET (100 MG TOTAL) BY MOUTH DAILY. TAKE WITH OR IMMEDIATELY FOLLOWING A MEAL. -     olmesartan-hydrochlorothiazide (BENICAR HCT) 20-12.5 MG tablet; Take 1 tablet by mouth daily.   I have changed [name] Pigeon. Henion's olmesartan-hydrochlorothiazide. I am also having him maintain his multivitamin, aspirin, fluticasone, b complex vitamins, famotidine, etodolac, glimepiride, donepezil, levothyroxine, and metoprolol succinate.  Meds ordered this encounter  Medications  . donepezil (ARICEPT) 10 MG tablet    Sig: TAKE 1 TABLET BY MOUTH EVERYDAY AT BEDTIME    Dispense:  90 tablet    Refill:  1  . levothyroxine (SYNTHROID, LEVOTHROID) 50 MCG tablet    Sig: TAKE 1 TABLET (50 MCG TOTAL) BY MOUTH DAILY.    Dispense:  90 tablet    Refill:  2  . metoprolol succinate (  TOPROL-XL) 100 MG 24 hr tablet    Sig: TAKE 1 TABLET (100 MG TOTAL) BY MOUTH DAILY. TAKE WITH OR IMMEDIATELY FOLLOWING A MEAL.    Dispense:  90 tablet    Refill:  1  . olmesartan-hydrochlorothiazide (BENICAR HCT) 20-12.5 MG tablet    Sig: Take 1 tablet by mouth daily.    Dispense:  90 tablet    Refill:  1     Follow-up: Return in about 3 months (around 07/31/2017).  Claretta Fraise, M.D.

## 2017-05-04 ENCOUNTER — Other Ambulatory Visit: Payer: Self-pay | Admitting: Family Medicine

## 2017-05-04 LAB — TSH: TSH: 3.31 u[IU]/mL (ref 0.450–4.500)

## 2017-05-04 LAB — CBC WITH DIFFERENTIAL/PLATELET
BASOS ABS: 0.1 10*3/uL (ref 0.0–0.2)
Basos: 1 %
EOS (ABSOLUTE): 0.2 10*3/uL (ref 0.0–0.4)
Eos: 3 %
Hematocrit: 34.7 % — ABNORMAL LOW (ref 37.5–51.0)
Hemoglobin: 11.6 g/dL — ABNORMAL LOW (ref 13.0–17.7)
IMMATURE GRANS (ABS): 0 10*3/uL (ref 0.0–0.1)
Immature Granulocytes: 0 %
LYMPHS: 25 %
Lymphocytes Absolute: 2.2 10*3/uL (ref 0.7–3.1)
MCH: 33.9 pg — AB (ref 26.6–33.0)
MCHC: 33.4 g/dL (ref 31.5–35.7)
MCV: 102 fL — ABNORMAL HIGH (ref 79–97)
MONOS ABS: 0.9 10*3/uL (ref 0.1–0.9)
Monocytes: 11 %
NEUTROS ABS: 5.5 10*3/uL (ref 1.4–7.0)
NEUTROS PCT: 60 %
PLATELETS: 266 10*3/uL (ref 150–379)
RBC: 3.42 x10E6/uL — ABNORMAL LOW (ref 4.14–5.80)
RDW: 13.3 % (ref 12.3–15.4)
WBC: 9 10*3/uL (ref 3.4–10.8)

## 2017-05-04 LAB — CMP14+EGFR
A/G RATIO: 1.4 (ref 1.2–2.2)
ALK PHOS: 50 IU/L (ref 39–117)
ALT: 19 IU/L (ref 0–44)
AST: 21 IU/L (ref 0–40)
Albumin: 3.8 g/dL (ref 3.2–4.6)
BILIRUBIN TOTAL: 0.4 mg/dL (ref 0.0–1.2)
BUN/Creatinine Ratio: 20 (ref 10–24)
BUN: 38 mg/dL — AB (ref 10–36)
CHLORIDE: 107 mmol/L — AB (ref 96–106)
CO2: 20 mmol/L (ref 20–29)
Calcium: 9.5 mg/dL (ref 8.6–10.2)
Creatinine, Ser: 1.9 mg/dL — ABNORMAL HIGH (ref 0.76–1.27)
GFR calc non Af Amer: 29 mL/min/{1.73_m2} — ABNORMAL LOW (ref >59)
GFR, EST AFRICAN AMERICAN: 34 mL/min/{1.73_m2} — AB (ref >59)
GLUCOSE: 94 mg/dL (ref 65–99)
Globulin, Total: 2.8 g/dL (ref 1.5–4.5)
POTASSIUM: 4.6 mmol/L (ref 3.5–5.2)
Sodium: 142 mmol/L (ref 134–144)
TOTAL PROTEIN: 6.6 g/dL (ref 6.0–8.5)

## 2017-05-04 LAB — PSA, TOTAL AND FREE
PSA FREE PCT: 58 %
PSA FREE: 0.58 ng/mL
Prostate Specific Ag, Serum: 1 ng/mL (ref 0.0–4.0)

## 2017-05-04 LAB — HGB A1C W/O EAG: HEMOGLOBIN A1C: 6.1 % — AB (ref 4.8–5.6)

## 2017-05-04 LAB — LIPID PANEL
CHOLESTEROL TOTAL: 142 mg/dL (ref 100–199)
Chol/HDL Ratio: 4.7 ratio (ref 0.0–5.0)
HDL: 30 mg/dL — AB (ref >39)
LDL Calculated: 64 mg/dL (ref 0–99)
TRIGLYCERIDES: 238 mg/dL — AB (ref 0–149)
VLDL CHOLESTEROL CAL: 48 mg/dL — AB (ref 5–40)

## 2017-05-04 LAB — T4, FREE: Free T4: 1.22 ng/dL (ref 0.82–1.77)

## 2017-05-04 MED ORDER — OLMESARTAN MEDOXOMIL 40 MG PO TABS
40.0000 mg | ORAL_TABLET | Freq: Every day | ORAL | 1 refills | Status: DC
Start: 2017-05-04 — End: 2017-06-10

## 2017-05-05 ENCOUNTER — Other Ambulatory Visit: Payer: Self-pay | Admitting: Family Medicine

## 2017-06-04 ENCOUNTER — Other Ambulatory Visit: Payer: Self-pay | Admitting: Family Medicine

## 2017-06-09 ENCOUNTER — Telehealth: Payer: Self-pay | Admitting: *Deleted

## 2017-06-09 DIAGNOSIS — L84 Corns and callosities: Secondary | ICD-10-CM | POA: Diagnosis not present

## 2017-06-09 DIAGNOSIS — E1151 Type 2 diabetes mellitus with diabetic peripheral angiopathy without gangrene: Secondary | ICD-10-CM | POA: Diagnosis not present

## 2017-06-09 DIAGNOSIS — M79676 Pain in unspecified toe(s): Secondary | ICD-10-CM | POA: Diagnosis not present

## 2017-06-09 DIAGNOSIS — B351 Tinea unguium: Secondary | ICD-10-CM | POA: Diagnosis not present

## 2017-06-09 NOTE — Telephone Encounter (Signed)
fax received [facility] requested Olmesartan 40 mg tab on backorder Please advise

## 2017-06-10 ENCOUNTER — Other Ambulatory Visit: Payer: Self-pay | Admitting: Family Medicine

## 2017-06-10 MED ORDER — IRBESARTAN 300 MG PO TABS: 300.0000 mg | ORAL_TABLET | Freq: Every day | ORAL | 5 refills | Status: DC

## 2017-06-10 NOTE — Telephone Encounter (Signed)
I sent in the requested prescription 

## 2017-06-10 NOTE — Telephone Encounter (Signed)
Pt notified of RX change

## 2017-07-04 ENCOUNTER — Other Ambulatory Visit: Payer: Self-pay | Admitting: Family Medicine

## 2017-07-12 ENCOUNTER — Other Ambulatory Visit: Payer: Self-pay | Admitting: Family Medicine

## 2017-07-12 DIAGNOSIS — Z0189 Encounter for other specified special examinations: Secondary | ICD-10-CM | POA: Diagnosis not present

## 2017-07-12 DIAGNOSIS — L97521 Non-pressure chronic ulcer of other part of left foot limited to breakdown of skin: Secondary | ICD-10-CM | POA: Diagnosis not present

## 2017-07-26 DIAGNOSIS — L97511 Non-pressure chronic ulcer of other part of right foot limited to breakdown of skin: Secondary | ICD-10-CM | POA: Diagnosis not present

## 2017-08-10 ENCOUNTER — Other Ambulatory Visit: Payer: Self-pay | Admitting: Family Medicine

## 2017-08-18 DIAGNOSIS — L97511 Non-pressure chronic ulcer of other part of right foot limited to breakdown of skin: Secondary | ICD-10-CM | POA: Diagnosis not present

## 2017-09-06 ENCOUNTER — Ambulatory Visit: Payer: Medicare Other | Admitting: Family Medicine

## 2017-09-06 DIAGNOSIS — L97522 Non-pressure chronic ulcer of other part of left foot with fat layer exposed: Secondary | ICD-10-CM | POA: Diagnosis not present

## 2017-09-06 DIAGNOSIS — E11621 Type 2 diabetes mellitus with foot ulcer: Secondary | ICD-10-CM | POA: Diagnosis not present

## 2017-09-06 DIAGNOSIS — L97529 Non-pressure chronic ulcer of other part of left foot with unspecified severity: Secondary | ICD-10-CM | POA: Diagnosis not present

## 2017-09-13 ENCOUNTER — Other Ambulatory Visit: Payer: Self-pay | Admitting: Family Medicine

## 2017-09-13 ENCOUNTER — Encounter: Payer: Self-pay | Admitting: Family Medicine

## 2017-09-13 ENCOUNTER — Ambulatory Visit (INDEPENDENT_AMBULATORY_CARE_PROVIDER_SITE_OTHER): Payer: Medicare Other | Admitting: Family Medicine

## 2017-09-13 VITALS — BP 139/63 | HR 65 | Temp 97.1°F | Ht 70.0 in | Wt 173.2 lb

## 2017-09-13 DIAGNOSIS — E1142 Type 2 diabetes mellitus with diabetic polyneuropathy: Secondary | ICD-10-CM | POA: Diagnosis not present

## 2017-09-13 DIAGNOSIS — L97529 Non-pressure chronic ulcer of other part of left foot with unspecified severity: Secondary | ICD-10-CM | POA: Diagnosis not present

## 2017-09-13 DIAGNOSIS — E038 Other specified hypothyroidism: Secondary | ICD-10-CM

## 2017-09-13 DIAGNOSIS — I10 Essential (primary) hypertension: Secondary | ICD-10-CM

## 2017-09-13 DIAGNOSIS — E11621 Type 2 diabetes mellitus with foot ulcer: Secondary | ICD-10-CM

## 2017-09-13 DIAGNOSIS — F0280 Dementia in other diseases classified elsewhere without behavioral disturbance: Secondary | ICD-10-CM | POA: Diagnosis not present

## 2017-09-13 DIAGNOSIS — G301 Alzheimer's disease with late onset: Secondary | ICD-10-CM

## 2017-09-13 LAB — BAYER DCA HB A1C WAIVED: HB A1C: 5.4 % (ref <7.0)

## 2017-09-13 MED ORDER — LEVOTHYROXINE SODIUM 50 MCG PO TABS: ORAL_TABLET | ORAL | 2 refills | Status: AC

## 2017-09-13 MED ORDER — FAMOTIDINE 20 MG PO TABS: 20.0000 mg | ORAL_TABLET | Freq: Two times a day (BID) | ORAL | 11 refills | Status: AC

## 2017-09-13 MED ORDER — METOPROLOL SUCCINATE ER 100 MG PO TB24: ORAL_TABLET | ORAL | 1 refills | Status: DC

## 2017-09-13 MED ORDER — GLIMEPIRIDE 2 MG PO TABS: ORAL_TABLET | ORAL | 1 refills | Status: DC

## 2017-09-13 MED ORDER — DONEPEZIL HCL 10 MG PO TABS: ORAL_TABLET | ORAL | 1 refills | Status: DC

## 2017-09-13 MED ORDER — IRBESARTAN 300 MG PO TABS: 300.0000 mg | ORAL_TABLET | Freq: Every day | ORAL | 1 refills | Status: DC

## 2017-09-13 MED ORDER — ETODOLAC 200 MG PO CAPS: 200.0000 mg | ORAL_CAPSULE | Freq: Every day | ORAL | 2 refills | Status: DC

## 2017-09-13 NOTE — Progress Notes (Signed)
Subjective:  Patient ID: [name] [name],  male    DOB: Jun 18, 1918  Age: 84 y.o.    CC: Medical Management of Chronic Issues   HPI [name] [name] presents for  follow-up of hypertension. Patient has no history of headache chest pain or shortness of breath or recent cough. Patient also denies symptoms of TIA such as numbness weakness lateralizing. Patient denies side effects from medication. States taking it regularly.  Patient also  in for follow-up of elevated cholesterol. Doing well without complaints on current medication. Denies side effects  including myalgia and arthralgia and nausea. Also in today for liver function testing. Currently no chest pain, shortness of breath or other cardiovascular related symptoms noted.  Follow-up of diabetes. Patient does not check blood sugar at home.  Patient and daughter who is caregiver and present today denies symptoms such as  urinary frequency, excessive hunger, nausea No significant hypoglycemic spells noted. Medications reviewed.  Daughter reports that she is giving them to him regularly.   Pt. denies complication/adverse reaction today.  Patient also dealing with dementia.  Daughter says he is pretty forgetful.  He does need a lot of assistance with ADLs.  He is sleeping more.  He has been eating less and losing some weight.  History [name] [name] has a past medical history of CAD (coronary artery disease) (2002), Carotid artery disease ([place]), Diabetic neuropathy ([place]), Ejection fraction, HTN (hypertension), CABG, Hyperlipidemia, Respiratory failure ([place]), and Shortness of breath.   He has a past surgical history that includes Coronary artery bypass graft; Knee Arthroplasty; Carpal tunnel release; and laparoscopic appendectomy (N/A, 01/10/2013).   His family history includes Heart attack (age of onset: 23) in his father; Heart disease in his father; Heart failure (age of onset: 8) in his mother.He reports that he has never smoked. He has never used  smokeless tobacco. He reports that he does not drink alcohol or use drugs.  Current Outpatient Medications on File Prior to Visit  Medication Sig Dispense Refill  . aspirin 81 MG tablet Take 81 mg by mouth daily.    Marland Kitchen b complex vitamins capsule Take 1 capsule by mouth every other day.     . fluticasone (FLONASE) 50 MCG/ACT nasal spray Place 2 sprays into both nostrils daily. 16 g 6  . Multiple Vitamin (MULTIVITAMIN) tablet Take 1 tablet by mouth daily.     No current facility-administered medications on file prior to visit.     ROS Review of Systems  Constitutional: Negative.   HENT: Negative.   Eyes: Negative for visual disturbance.  Respiratory: Negative for cough and shortness of breath.   Cardiovascular: Negative for chest pain and leg swelling.  Gastrointestinal: Negative for abdominal pain, diarrhea, nausea and vomiting.  Endocrine: Negative for polyuria.  Genitourinary: Negative for difficulty urinating and frequency.  Musculoskeletal: Negative for arthralgias and myalgias.  Skin: Positive for wound (Wound at left foot is under care of podiatry and the wound center.  Daughter states that they told her it should heal nicely.  Not suspicious for spreading or causing amputation). Negative for rash.  Neurological: Negative for headaches.  Psychiatric/Behavioral: Negative for sleep disturbance.    Objective:  BP 139/63   Pulse 65   Temp (!) 97.1 F (36.2 C) (Oral)   Ht 5' 10" (1.778 m)   Wt 173 lb 4 oz (78.6 kg)   BMI 24.86 kg/m   BP Readings from Last 3 Encounters:  09/13/17 139/63  05/03/17 (!) 128/51  11/17/16 110/60  Wt Readings from Last 3 Encounters:  09/13/17 173 lb 4 oz (78.6 kg)  05/03/17 180 lb (81.6 kg)  11/17/16 184 lb (83.5 kg)     Physical Exam  Constitutional: He is oriented to person, place, and time. He appears well-developed and well-nourished. No distress.  HENT:  Head: Normocephalic and atraumatic.  Right Ear: External ear normal.  Left  Ear: External ear normal.  Nose: Nose normal.  Mouth/Throat: Oropharynx is clear and moist.  Eyes: Pupils are equal, round, and reactive to light. Conjunctivae and EOM are normal.  Neck: Normal range of motion. Neck supple. No thyromegaly present.  Cardiovascular: Normal rate, regular rhythm and normal heart sounds.  No murmur heard. Pulmonary/Chest: Effort normal and breath sounds normal. No respiratory distress. He has no wheezes. He has no rales.  Abdominal: Soft. Bowel sounds are normal. He exhibits no distension. There is no tenderness.  Musculoskeletal: Normal range of motion.  The foot lesion was not examined since he is under care of 2 different specialty services for this condition.  Lymphadenopathy:    He has no cervical adenopathy.  Neurological: He is alert and oriented to person, place, and time. He has normal reflexes.  Skin: Skin is warm and dry.  Psychiatric: He has a normal mood and affect. His speech is normal and behavior is normal. Cognition and memory are impaired.      Assessment & Plan:   [name] [name] was seen today for medical management of chronic issues.  Diagnoses and all orders for this visit:  Diabetic polyneuropathy associated with type 2 diabetes mellitus ([place]) -     CMP14+EGFR -     Bayer DCA Hb A1c Waived  Other specified hypothyroidism -     TSH -     T4, Free  Diabetic ulcer of left foot associated with type 2 diabetes mellitus, unspecified part of foot, unspecified ulcer stage ([place])  Essential hypertension  Late onset Alzheimer's disease without behavioral disturbance  Other orders -     donepezil (ARICEPT) 10 MG tablet; TAKE 1 TABLET BY MOUTH EVERYDAY AT BEDTIME -     etodolac (LODINE) 200 MG capsule; Take 1 capsule (200 mg total) by mouth daily. -     famotidine (PEPCID) 20 MG tablet; Take 1 tablet (20 mg total) by mouth 2 (two) times daily. -     glimepiride (AMARYL) 2 MG tablet; TAKE 1 TABLET (2 MG TOTAL) BY MOUTH DAILY BEFORE  BREAKFAST. -     irbesartan (AVAPRO) 300 MG tablet; Take 1 tablet (300 mg total) by mouth daily. -     levothyroxine (SYNTHROID, LEVOTHROID) 50 MCG tablet; TAKE 1 TABLET (50 MCG TOTAL) BY MOUTH DAILY. -     metoprolol succinate (TOPROL-XL) 100 MG 24 hr tablet; TAKE 1 TABLET (100 MG TOTAL) BY MOUTH DAILY. TAKE WITH OR IMMEDIATELY FOLLOWING A MEAL.   I am having [name] Pigeon. Arel maintain his multivitamin, aspirin, fluticasone, b complex vitamins, donepezil, etodolac, famotidine, glimepiride, irbesartan, levothyroxine, and metoprolol succinate.  Meds ordered this encounter  Medications  . donepezil (ARICEPT) 10 MG tablet    Sig: TAKE 1 TABLET BY MOUTH EVERYDAY AT BEDTIME    Dispense:  90 tablet    Refill:  1  . etodolac (LODINE) 200 MG capsule    Sig: Take 1 capsule (200 mg total) by mouth daily.    Dispense:  30 capsule    Refill:  2  . famotidine (PEPCID) 20 MG tablet    Sig: Take 1 tablet (20  mg total) by mouth 2 (two) times daily.    Dispense:  60 tablet    Refill:  11  . glimepiride (AMARYL) 2 MG tablet    Sig: TAKE 1 TABLET (2 MG TOTAL) BY MOUTH DAILY BEFORE BREAKFAST.    Dispense:  90 tablet    Refill:  1  . irbesartan (AVAPRO) 300 MG tablet    Sig: Take 1 tablet (300 mg total) by mouth daily.    Dispense:  90 tablet    Refill:  1  . levothyroxine (SYNTHROID, LEVOTHROID) 50 MCG tablet    Sig: TAKE 1 TABLET (50 MCG TOTAL) BY MOUTH DAILY.    Dispense:  90 tablet    Refill:  2  . metoprolol succinate (TOPROL-XL) 100 MG 24 hr tablet    Sig: TAKE 1 TABLET (100 MG TOTAL) BY MOUTH DAILY. TAKE WITH OR IMMEDIATELY FOLLOWING A MEAL.    Dispense:  90 tablet    Refill:  1     Follow-up: Return in about 3 months (around 12/14/2017).  Claretta Fraise, M.D.

## 2017-09-14 LAB — CMP14+EGFR
A/G RATIO: 1.5 (ref 1.2–2.2)
ALT: 18 IU/L (ref 0–44)
AST: 16 IU/L (ref 0–40)
Albumin: 3.8 g/dL (ref 3.2–4.6)
Alkaline Phosphatase: 60 IU/L (ref 39–117)
BUN/Creatinine Ratio: 14 (ref 10–24)
BUN: 21 mg/dL (ref 10–36)
Bilirubin Total: 0.7 mg/dL (ref 0.0–1.2)
CALCIUM: 9.5 mg/dL (ref 8.6–10.2)
CO2: 25 mmol/L (ref 20–29)
Chloride: 100 mmol/L (ref 96–106)
Creatinine, Ser: 1.49 mg/dL — ABNORMAL HIGH (ref 0.76–1.27)
GFR, EST AFRICAN AMERICAN: 45 mL/min/{1.73_m2} — AB (ref >59)
GFR, EST NON AFRICAN AMERICAN: 39 mL/min/{1.73_m2} — AB (ref >59)
GLOBULIN, TOTAL: 2.5 g/dL (ref 1.5–4.5)
Glucose: 160 mg/dL — ABNORMAL HIGH (ref 65–99)
POTASSIUM: 4.2 mmol/L (ref 3.5–5.2)
SODIUM: 142 mmol/L (ref 134–144)
Total Protein: 6.3 g/dL (ref 6.0–8.5)

## 2017-09-14 LAB — TSH: TSH: 3.93 u[IU]/mL (ref 0.450–4.500)

## 2017-09-14 LAB — T4, FREE: Free T4: 1.38 ng/dL (ref 0.82–1.77)

## 2017-09-26 ENCOUNTER — Emergency Department (HOSPITAL_COMMUNITY): Payer: Medicare Other

## 2017-09-26 ENCOUNTER — Encounter (HOSPITAL_COMMUNITY): Payer: Self-pay | Admitting: Emergency Medicine

## 2017-09-26 ENCOUNTER — Inpatient Hospital Stay (HOSPITAL_COMMUNITY)
Admission: EM | Admit: 2017-09-26 | Discharge: 2017-10-03 | DRG: 871 | Disposition: A | Discharge status: Skilled Nursing Facility | Payer: Medicare Other | Attending: Internal Medicine | Admitting: Internal Medicine

## 2017-09-26 DIAGNOSIS — E860 Dehydration: Secondary | ICD-10-CM | POA: Diagnosis not present

## 2017-09-26 DIAGNOSIS — I2510 Atherosclerotic heart disease of native coronary artery without angina pectoris: Secondary | ICD-10-CM | POA: Diagnosis present

## 2017-09-26 DIAGNOSIS — L89890 Pressure ulcer of other site, unstageable: Secondary | ICD-10-CM | POA: Diagnosis present

## 2017-09-26 DIAGNOSIS — E1122 Type 2 diabetes mellitus with diabetic chronic kidney disease: Secondary | ICD-10-CM | POA: Diagnosis present

## 2017-09-26 DIAGNOSIS — E11621 Type 2 diabetes mellitus with foot ulcer: Secondary | ICD-10-CM | POA: Diagnosis present

## 2017-09-26 DIAGNOSIS — I5021 Acute systolic (congestive) heart failure: Secondary | ICD-10-CM | POA: Diagnosis not present

## 2017-09-26 DIAGNOSIS — Z951 Presence of aortocoronary bypass graft: Secondary | ICD-10-CM

## 2017-09-26 DIAGNOSIS — I509 Heart failure, unspecified: Secondary | ICD-10-CM | POA: Diagnosis present

## 2017-09-26 DIAGNOSIS — L89896 Pressure-induced deep tissue damage of other site: Secondary | ICD-10-CM | POA: Diagnosis present

## 2017-09-26 DIAGNOSIS — L89899 Pressure ulcer of other site, unspecified stage: Secondary | ICD-10-CM | POA: Diagnosis not present

## 2017-09-26 DIAGNOSIS — L97529 Non-pressure chronic ulcer of other part of left foot with unspecified severity: Secondary | ICD-10-CM | POA: Diagnosis not present

## 2017-09-26 DIAGNOSIS — K5900 Constipation, unspecified: Secondary | ICD-10-CM | POA: Diagnosis not present

## 2017-09-26 DIAGNOSIS — I130 Hypertensive heart and chronic kidney disease with heart failure and stage 1 through stage 4 chronic kidney disease, or unspecified chronic kidney disease: Secondary | ICD-10-CM | POA: Diagnosis present

## 2017-09-26 DIAGNOSIS — E038 Other specified hypothyroidism: Secondary | ICD-10-CM | POA: Diagnosis not present

## 2017-09-26 DIAGNOSIS — Z79899 Other long term (current) drug therapy: Secondary | ICD-10-CM

## 2017-09-26 DIAGNOSIS — G309 Alzheimer's disease, unspecified: Secondary | ICD-10-CM | POA: Diagnosis not present

## 2017-09-26 DIAGNOSIS — R0902 Hypoxemia: Secondary | ICD-10-CM | POA: Diagnosis not present

## 2017-09-26 DIAGNOSIS — R0600 Dyspnea, unspecified: Secondary | ICD-10-CM

## 2017-09-26 DIAGNOSIS — W1830XA Fall on same level, unspecified, initial encounter: Secondary | ICD-10-CM | POA: Diagnosis present

## 2017-09-26 DIAGNOSIS — I5042 Chronic combined systolic (congestive) and diastolic (congestive) heart failure: Secondary | ICD-10-CM | POA: Diagnosis not present

## 2017-09-26 DIAGNOSIS — N179 Acute kidney failure, unspecified: Secondary | ICD-10-CM | POA: Diagnosis not present

## 2017-09-26 DIAGNOSIS — M545 Low back pain: Secondary | ICD-10-CM | POA: Diagnosis not present

## 2017-09-26 DIAGNOSIS — M5136 Other intervertebral disc degeneration, lumbar region: Secondary | ICD-10-CM | POA: Diagnosis present

## 2017-09-26 DIAGNOSIS — E039 Hypothyroidism, unspecified: Secondary | ICD-10-CM | POA: Diagnosis present

## 2017-09-26 DIAGNOSIS — J9601 Acute respiratory failure with hypoxia: Secondary | ICD-10-CM | POA: Diagnosis not present

## 2017-09-26 DIAGNOSIS — A4101 Sepsis due to Methicillin susceptible Staphylococcus aureus: Principal | ICD-10-CM | POA: Diagnosis present

## 2017-09-26 DIAGNOSIS — Z7982 Long term (current) use of aspirin: Secondary | ICD-10-CM

## 2017-09-26 DIAGNOSIS — E1165 Type 2 diabetes mellitus with hyperglycemia: Secondary | ICD-10-CM | POA: Diagnosis present

## 2017-09-26 DIAGNOSIS — E1140 Type 2 diabetes mellitus with diabetic neuropathy, unspecified: Secondary | ICD-10-CM | POA: Diagnosis present

## 2017-09-26 DIAGNOSIS — G301 Alzheimer's disease with late onset: Secondary | ICD-10-CM

## 2017-09-26 DIAGNOSIS — L97509 Non-pressure chronic ulcer of other part of unspecified foot with unspecified severity: Secondary | ICD-10-CM | POA: Diagnosis present

## 2017-09-26 DIAGNOSIS — F0280 Dementia in other diseases classified elsewhere without behavioral disturbance: Secondary | ICD-10-CM | POA: Diagnosis not present

## 2017-09-26 DIAGNOSIS — B9561 Methicillin susceptible Staphylococcus aureus infection as the cause of diseases classified elsewhere: Secondary | ICD-10-CM | POA: Diagnosis present

## 2017-09-26 DIAGNOSIS — N183 Chronic kidney disease, stage 3 unspecified: Secondary | ICD-10-CM

## 2017-09-26 DIAGNOSIS — I5043 Acute on chronic combined systolic (congestive) and diastolic (congestive) heart failure: Secondary | ICD-10-CM | POA: Diagnosis not present

## 2017-09-26 DIAGNOSIS — Z7401 Bed confinement status: Secondary | ICD-10-CM | POA: Diagnosis not present

## 2017-09-26 DIAGNOSIS — R6520 Severe sepsis without septic shock: Secondary | ICD-10-CM | POA: Diagnosis present

## 2017-09-26 DIAGNOSIS — R41841 Cognitive communication deficit: Secondary | ICD-10-CM | POA: Diagnosis not present

## 2017-09-26 DIAGNOSIS — M79672 Pain in left foot: Secondary | ICD-10-CM | POA: Diagnosis not present

## 2017-09-26 DIAGNOSIS — I5023 Acute on chronic systolic (congestive) heart failure: Secondary | ICD-10-CM | POA: Diagnosis not present

## 2017-09-26 DIAGNOSIS — L03116 Cellulitis of left lower limb: Secondary | ICD-10-CM | POA: Diagnosis present

## 2017-09-26 DIAGNOSIS — Z9181 History of falling: Secondary | ICD-10-CM | POA: Diagnosis not present

## 2017-09-26 DIAGNOSIS — Z66 Do not resuscitate: Secondary | ICD-10-CM | POA: Diagnosis present

## 2017-09-26 DIAGNOSIS — R6510 Systemic inflammatory response syndrome (SIRS) of non-infectious origin without acute organ dysfunction: Secondary | ICD-10-CM

## 2017-09-26 DIAGNOSIS — M5442 Lumbago with sciatica, left side: Secondary | ICD-10-CM | POA: Diagnosis not present

## 2017-09-26 DIAGNOSIS — E119 Type 2 diabetes mellitus without complications: Secondary | ICD-10-CM

## 2017-09-26 DIAGNOSIS — Z95828 Presence of other vascular implants and grafts: Secondary | ICD-10-CM

## 2017-09-26 DIAGNOSIS — R2681 Unsteadiness on feet: Secondary | ICD-10-CM | POA: Diagnosis not present

## 2017-09-26 DIAGNOSIS — J9811 Atelectasis: Secondary | ICD-10-CM | POA: Diagnosis present

## 2017-09-26 DIAGNOSIS — M5441 Lumbago with sciatica, right side: Secondary | ICD-10-CM | POA: Diagnosis not present

## 2017-09-26 DIAGNOSIS — I10 Essential (primary) hypertension: Secondary | ICD-10-CM | POA: Diagnosis present

## 2017-09-26 DIAGNOSIS — E785 Hyperlipidemia, unspecified: Secondary | ICD-10-CM | POA: Diagnosis present

## 2017-09-26 DIAGNOSIS — M6281 Muscle weakness (generalized): Secondary | ICD-10-CM | POA: Diagnosis not present

## 2017-09-26 DIAGNOSIS — A419 Sepsis, unspecified organism: Secondary | ICD-10-CM | POA: Diagnosis not present

## 2017-09-26 DIAGNOSIS — S3991XA Unspecified injury of abdomen, initial encounter: Secondary | ICD-10-CM | POA: Diagnosis not present

## 2017-09-26 DIAGNOSIS — L8990 Pressure ulcer of unspecified site, unspecified stage: Secondary | ICD-10-CM | POA: Diagnosis present

## 2017-09-26 DIAGNOSIS — G9340 Encephalopathy, unspecified: Secondary | ICD-10-CM | POA: Diagnosis not present

## 2017-09-26 DIAGNOSIS — D539 Nutritional anemia, unspecified: Secondary | ICD-10-CM | POA: Diagnosis not present

## 2017-09-26 DIAGNOSIS — J90 Pleural effusion, not elsewhere classified: Secondary | ICD-10-CM | POA: Diagnosis not present

## 2017-09-26 DIAGNOSIS — K219 Gastro-esophageal reflux disease without esophagitis: Secondary | ICD-10-CM | POA: Diagnosis not present

## 2017-09-26 DIAGNOSIS — E872 Acidosis: Secondary | ICD-10-CM | POA: Diagnosis not present

## 2017-09-26 DIAGNOSIS — S90822A Blister (nonthermal), left foot, initial encounter: Secondary | ICD-10-CM | POA: Diagnosis not present

## 2017-09-26 DIAGNOSIS — M60872 Other myositis, left ankle and foot: Secondary | ICD-10-CM | POA: Diagnosis present

## 2017-09-26 DIAGNOSIS — B9562 Methicillin resistant Staphylococcus aureus infection as the cause of diseases classified elsewhere: Secondary | ICD-10-CM | POA: Diagnosis not present

## 2017-09-26 DIAGNOSIS — Z452 Encounter for adjustment and management of vascular access device: Secondary | ICD-10-CM | POA: Diagnosis not present

## 2017-09-26 DIAGNOSIS — W19XXXA Unspecified fall, initial encounter: Secondary | ICD-10-CM | POA: Diagnosis not present

## 2017-09-26 DIAGNOSIS — S299XXA Unspecified injury of thorax, initial encounter: Secondary | ICD-10-CM | POA: Diagnosis not present

## 2017-09-26 DIAGNOSIS — I361 Nonrheumatic tricuspid (valve) insufficiency: Secondary | ICD-10-CM | POA: Diagnosis not present

## 2017-09-26 DIAGNOSIS — Z8249 Family history of ischemic heart disease and other diseases of the circulatory system: Secondary | ICD-10-CM | POA: Diagnosis not present

## 2017-09-26 DIAGNOSIS — G9341 Metabolic encephalopathy: Secondary | ICD-10-CM | POA: Diagnosis present

## 2017-09-26 DIAGNOSIS — M2550 Pain in unspecified joint: Secondary | ICD-10-CM | POA: Diagnosis not present

## 2017-09-26 DIAGNOSIS — M549 Dorsalgia, unspecified: Secondary | ICD-10-CM

## 2017-09-26 DIAGNOSIS — R7881 Bacteremia: Secondary | ICD-10-CM | POA: Diagnosis not present

## 2017-09-26 DIAGNOSIS — I5041 Acute combined systolic (congestive) and diastolic (congestive) heart failure: Secondary | ICD-10-CM | POA: Diagnosis not present

## 2017-09-26 DIAGNOSIS — D649 Anemia, unspecified: Secondary | ICD-10-CM | POA: Diagnosis present

## 2017-09-26 DIAGNOSIS — S99922A Unspecified injury of left foot, initial encounter: Secondary | ICD-10-CM | POA: Diagnosis not present

## 2017-09-26 DIAGNOSIS — J309 Allergic rhinitis, unspecified: Secondary | ICD-10-CM | POA: Diagnosis not present

## 2017-09-26 DIAGNOSIS — E538 Deficiency of other specified B group vitamins: Secondary | ICD-10-CM | POA: Diagnosis not present

## 2017-09-26 HISTORY — DX: Chronic kidney disease, stage 3 unspecified: N18.30

## 2017-09-26 HISTORY — DX: Type 2 diabetes mellitus without complications: E11.9

## 2017-09-26 LAB — CBC WITH DIFFERENTIAL/PLATELET
ABS IMMATURE GRANULOCYTES: 0.4 10*3/uL — AB (ref 0.0–0.1)
Basophils Absolute: 0.1 10*3/uL (ref 0.0–0.1)
Basophils Relative: 0 %
EOS ABS: 0 10*3/uL (ref 0.0–0.7)
Eosinophils Relative: 0 %
HCT: 35.2 % — ABNORMAL LOW (ref 39.0–52.0)
Hemoglobin: 11 g/dL — ABNORMAL LOW (ref 13.0–17.0)
IMMATURE GRANULOCYTES: 2 %
LYMPHS ABS: 0.4 10*3/uL — AB (ref 0.7–4.0)
Lymphocytes Relative: 2 %
MCH: 34.3 pg — ABNORMAL HIGH (ref 26.0–34.0)
MCHC: 31.3 g/dL (ref 30.0–36.0)
MCV: 109.7 fL — AB (ref 78.0–100.0)
MONO ABS: 0.8 10*3/uL (ref 0.1–1.0)
Monocytes Relative: 3 %
NEUTROS PCT: 93 %
Neutro Abs: 21.6 10*3/uL — ABNORMAL HIGH (ref 1.7–7.7)
Platelets: 175 10*3/uL (ref 150–400)
RBC: 3.21 MIL/uL — ABNORMAL LOW (ref 4.22–5.81)
RDW: 13.8 % (ref 11.5–15.5)
WBC: 23.3 10*3/uL — ABNORMAL HIGH (ref 4.0–10.5)

## 2017-09-26 LAB — COMPREHENSIVE METABOLIC PANEL
ALT: 26 U/L (ref 0–44)
AST: 32 U/L (ref 15–41)
Albumin: 2.9 g/dL — ABNORMAL LOW (ref 3.5–5.0)
Alkaline Phosphatase: 66 U/L (ref 38–126)
Anion gap: 18 — ABNORMAL HIGH (ref 5–15)
BILIRUBIN TOTAL: 1.8 mg/dL — AB (ref 0.3–1.2)
BUN: 31 mg/dL — AB (ref 8–23)
CO2: 19 mmol/L — ABNORMAL LOW (ref 22–32)
CREATININE: 1.82 mg/dL — AB (ref 0.61–1.24)
Calcium: 8.5 mg/dL — ABNORMAL LOW (ref 8.9–10.3)
Chloride: 102 mmol/L (ref 98–111)
GFR calc Af Amer: 34 mL/min — ABNORMAL LOW (ref >60)
GFR, EST NON AFRICAN AMERICAN: 30 mL/min — AB (ref >60)
Glucose, Bld: 207 mg/dL — ABNORMAL HIGH (ref 70–99)
Potassium: 4.7 mmol/L (ref 3.5–5.1)
Sodium: 139 mmol/L (ref 135–145)
TOTAL PROTEIN: 5.9 g/dL — AB (ref 6.5–8.1)

## 2017-09-26 LAB — PROCALCITONIN: PROCALCITONIN: 2.2 ng/mL

## 2017-09-26 LAB — PROTIME-INR
INR: 1.56
Prothrombin Time: 18.6 seconds — ABNORMAL HIGH (ref 11.4–15.2)

## 2017-09-26 LAB — I-STAT CG4 LACTIC ACID, ED
Lactic Acid, Venous: 4.79 mmol/L (ref 0.5–1.9)
Lactic Acid, Venous: 4.95 mmol/L (ref 0.5–1.9)

## 2017-09-26 LAB — URINALYSIS, ROUTINE W REFLEX MICROSCOPIC
Bacteria, UA: NONE SEEN
Glucose, UA: NEGATIVE mg/dL
Hgb urine dipstick: NEGATIVE
KETONES UR: 5 mg/dL — AB
Leukocytes, UA: NEGATIVE
Nitrite: NEGATIVE
PH: 5 (ref 5.0–8.0)
Protein, ur: 30 mg/dL — AB
SPECIFIC GRAVITY, URINE: 1.02 (ref 1.005–1.030)

## 2017-09-26 LAB — CK: CK TOTAL: 113 U/L (ref 49–397)

## 2017-09-26 LAB — LACTIC ACID, PLASMA: LACTIC ACID, VENOUS: 6.4 mmol/L — AB (ref 0.5–1.9)

## 2017-09-26 LAB — GLUCOSE, CAPILLARY: Glucose-Capillary: 211 mg/dL — ABNORMAL HIGH (ref 70–99)

## 2017-09-26 MED ORDER — INSULIN ASPART 100 UNIT/ML ~~LOC~~ SOLN
0.0000 [IU] | Freq: Every day | SUBCUTANEOUS | Status: DC
  Administered 2017-09-26 – 2017-10-02 (×4): 2 [IU] via SUBCUTANEOUS

## 2017-09-26 MED ORDER — SODIUM CHLORIDE 0.9 % IV SOLN
1.0000 g | INTRAVENOUS | Status: DC
  Administered 2017-09-26: 1 g via INTRAVENOUS
  Filled 2017-09-26: qty 1

## 2017-09-26 MED ORDER — SENNOSIDES-DOCUSATE SODIUM 8.6-50 MG PO TABS: 1.0000 | ORAL_TABLET | Freq: Every evening | ORAL | Status: DC | PRN

## 2017-09-26 MED ORDER — SODIUM CHLORIDE 0.9 % IV BOLUS (SEPSIS)
500.0000 mL | Freq: Once | INTRAVENOUS | Status: AC
  Administered 2017-09-26: 500 mL via INTRAVENOUS

## 2017-09-26 MED ORDER — VANCOMYCIN HCL IN DEXTROSE 750-5 MG/150ML-% IV SOLN: 750.0000 mg | INTRAVENOUS | Status: DC

## 2017-09-26 MED ORDER — METOPROLOL SUCCINATE ER 100 MG PO TB24
100.0000 mg | ORAL_TABLET | Freq: Every day | ORAL | Status: DC
  Administered 2017-09-27: 100 mg via ORAL
  Filled 2017-09-26: qty 1

## 2017-09-26 MED ORDER — SODIUM CHLORIDE 0.9 % IV BOLUS
250.0000 mL | Freq: Once | INTRAVENOUS | Status: AC
  Administered 2017-09-26: 250 mL via INTRAVENOUS

## 2017-09-26 MED ORDER — HYDROCODONE-ACETAMINOPHEN 5-325 MG PO TABS: 1.0000 | ORAL_TABLET | ORAL | Status: DC | PRN

## 2017-09-26 MED ORDER — BISACODYL 5 MG PO TBEC
5.0000 mg | DELAYED_RELEASE_TABLET | Freq: Every day | ORAL | Status: DC | PRN
  Administered 2017-10-02: 5 mg via ORAL
  Filled 2017-09-26: qty 1

## 2017-09-26 MED ORDER — SODIUM CHLORIDE 0.9 % IV SOLN
Freq: Once | INTRAVENOUS | Status: AC
  Administered 2017-09-26: 20:00:00 via INTRAVENOUS

## 2017-09-26 MED ORDER — SODIUM CHLORIDE 0.9% FLUSH
3.0000 mL | Freq: Two times a day (BID) | INTRAVENOUS | Status: DC
  Administered 2017-09-27 – 2017-10-01 (×6): 3 mL via INTRAVENOUS

## 2017-09-26 MED ORDER — VANCOMYCIN HCL IN DEXTROSE 1-5 GM/200ML-% IV SOLN
1000.0000 mg | Freq: Once | INTRAVENOUS | Status: AC
  Administered 2017-09-26: 1000 mg via INTRAVENOUS
  Filled 2017-09-26: qty 200

## 2017-09-26 MED ORDER — B COMPLEX-C PO TABS
1.0000 | ORAL_TABLET | ORAL | Status: DC
  Administered 2017-09-28 – 2017-10-02 (×3): 1 via ORAL
  Filled 2017-09-26 (×4): qty 1

## 2017-09-26 MED ORDER — SODIUM CHLORIDE 0.9 % IV BOLUS
2000.0000 mL | Freq: Once | INTRAVENOUS | Status: AC
  Administered 2017-09-26: 2000 mL via INTRAVENOUS

## 2017-09-26 MED ORDER — LEVOTHYROXINE SODIUM 50 MCG PO TABS
50.0000 ug | ORAL_TABLET | Freq: Every day | ORAL | Status: DC
  Administered 2017-09-27 – 2017-10-03 (×7): 50 ug via ORAL
  Filled 2017-09-26 (×7): qty 1

## 2017-09-26 MED ORDER — ASPIRIN EC 81 MG PO TBEC
81.0000 mg | DELAYED_RELEASE_TABLET | Freq: Every day | ORAL | Status: DC
  Administered 2017-09-27 – 2017-10-03 (×7): 81 mg via ORAL
  Filled 2017-09-26 (×7): qty 1

## 2017-09-26 MED ORDER — DONEPEZIL HCL 10 MG PO TABS
10.0000 mg | ORAL_TABLET | Freq: Every day | ORAL | Status: DC
  Administered 2017-09-26 – 2017-10-02 (×7): 10 mg via ORAL
  Filled 2017-09-26 (×8): qty 1

## 2017-09-26 MED ORDER — PIPERACILLIN-TAZOBACTAM 3.375 G IVPB 30 MIN
3.3750 g | Freq: Once | INTRAVENOUS | Status: AC
  Administered 2017-09-26: 3.375 g via INTRAVENOUS
  Filled 2017-09-26: qty 50

## 2017-09-26 MED ORDER — FENTANYL CITRATE (PF) 100 MCG/2ML IJ SOLN: 12.5000 ug | INTRAMUSCULAR | Status: DC | PRN

## 2017-09-26 MED ORDER — ACETAMINOPHEN 650 MG RE SUPP: 650.0000 mg | Freq: Four times a day (QID) | RECTAL | Status: DC | PRN

## 2017-09-26 MED ORDER — ADULT MULTIVITAMIN W/MINERALS CH
1.0000 | ORAL_TABLET | Freq: Every day | ORAL | Status: DC
  Administered 2017-09-27 – 2017-10-03 (×7): 1 via ORAL
  Filled 2017-09-26 (×8): qty 1

## 2017-09-26 MED ORDER — INSULIN ASPART 100 UNIT/ML ~~LOC~~ SOLN
0.0000 [IU] | Freq: Three times a day (TID) | SUBCUTANEOUS | Status: DC
  Administered 2017-09-27 (×2): 2 [IU] via SUBCUTANEOUS
  Administered 2017-09-27: 3 [IU] via SUBCUTANEOUS
  Administered 2017-09-28 (×2): 2 [IU] via SUBCUTANEOUS
  Administered 2017-09-28: 1 [IU] via SUBCUTANEOUS
  Administered 2017-09-29: 2 [IU] via SUBCUTANEOUS
  Administered 2017-09-29 (×2): 1 [IU] via SUBCUTANEOUS

## 2017-09-26 MED ORDER — ACETAMINOPHEN 325 MG PO TABS
650.0000 mg | ORAL_TABLET | Freq: Once | ORAL | Status: AC
  Administered 2017-09-26: 650 mg via ORAL
  Filled 2017-09-26: qty 2

## 2017-09-26 MED ORDER — SODIUM CHLORIDE 0.9 % IV SOLN
INTRAVENOUS | Status: AC
  Administered 2017-09-26: 23:00:00 via INTRAVENOUS

## 2017-09-26 MED ORDER — ONDANSETRON HCL 4 MG PO TABS: 4.0000 mg | ORAL_TABLET | Freq: Four times a day (QID) | ORAL | Status: DC | PRN

## 2017-09-26 MED ORDER — ACETAMINOPHEN 325 MG PO TABS: 650.0000 mg | ORAL_TABLET | Freq: Four times a day (QID) | ORAL | Status: DC | PRN

## 2017-09-26 MED ORDER — FAMOTIDINE 20 MG PO TABS
20.0000 mg | ORAL_TABLET | Freq: Every day | ORAL | Status: DC
  Administered 2017-09-27 – 2017-10-03 (×7): 20 mg via ORAL
  Filled 2017-09-26 (×7): qty 1

## 2017-09-26 MED ORDER — HEPARIN SODIUM (PORCINE) 5000 UNIT/ML IJ SOLN
5000.0000 [IU] | Freq: Three times a day (TID) | INTRAMUSCULAR | Status: DC
  Administered 2017-09-26 – 2017-10-03 (×21): 5000 [IU] via SUBCUTANEOUS
  Filled 2017-09-26 (×21): qty 1

## 2017-09-26 MED ORDER — ONDANSETRON HCL 4 MG/2ML IJ SOLN: 4.0000 mg | Freq: Four times a day (QID) | INTRAMUSCULAR | Status: DC | PRN

## 2017-09-26 NOTE — ED Notes (Signed)
Called lab adding on CK

## 2017-09-26 NOTE — ED Notes (Signed)
Attempted to draw labs unsuccessfully x2

## 2017-09-26 NOTE — Progress Notes (Signed)
Pharmacy Antibiotic Note  [name] [name] is a 84 y.o. male admitted on 09/26/2017 with sepsis.  Pharmacy has been consulted for cefepime and vancomycin dosing.  Was already given a dose of vancomycin and Zosyn.  Plan: Start cefepime 1g IV Q24h Start vancomycin 750mg  IV Q24h Monitor clinical picture, renal function, VT prn F/U C&S, abx deescalation / LOT   Height: 5\' 9"  (175.3 cm) Weight: 173 lb (78.5 kg) IBW/kg (Calculated) : 70.7  Temp (24hrs), Avg:99.2 F (37.3 C), Min:97.7 F (36.5 C), Max:101.5 F (38.6 C)  Recent Labs  Lab 09/26/17 1551 09/26/17 1759 09/26/17 1847  WBC 23.3*  --   --   CREATININE 1.82*  --   --   LATICACIDVEN  --  4.79* 4.95*    Estimated Creatinine Clearance: 23.7 mL/min (A) (by C-G formula based on SCr of 1.82 mg/dL (H)).    No Known Allergies  Thank you for allowing pharmacy to be a part of this patient's care.  Reginia Naas 09/26/2017 8:54 PM

## 2017-09-26 NOTE — Progress Notes (Signed)
CRITICAL VALUE ALERT  Critical Value:  Lactic 6.4  Date & Time Notied:  2317  Provider Notified: Opyd, MD  Orders Received/Actions taken: 250 cc NS bolus

## 2017-09-26 NOTE — ED Notes (Signed)
IV therapy at bedside.

## 2017-09-26 NOTE — H&P (Addendum)
History and Physical    [name] [name] [place] ZOX:[id number] DOB: 1919-02-02 DOA: 09/26/2017  PCP: Claretta Fraise, MD   Patient coming from: Home   Chief Complaint: Fatigue, low back pain   HPI: [name] [name] is a 84 y.o. male with medical history significant for dementia, hypertension, hypothyroidism, coronary artery disease, and type 2 diabetes mellitus, now presenting to the emergency department for evaluation of fatigue and low back pain.  Patient lives at home with 24-hour caregivers and is usually up in the morning for breakfast, but was noted to be sleeping most of the day for the past couple days.  He had suffered a ground-level mechanical fall a few days ago and had been complaining of low back pain since then.  He has not been complaining of abdominal pain and there has not been any diarrhea or vomiting reported.  Patient may have had a mild cough, but has not appeared short of breath.  History is obtained through EMR and report of the patient's daughter at the bedside.  Patient's daughter had been away on vacation, but returned today when alerted by the caregiver that patient had been sleeping much more than usual.  ED Course: Upon arrival to the ED, patient is found to be febrile to 38.6 C, saturating low 90s on room air, slightly tachypneic, and with vitals otherwise normal.  EKG features a sinus rhythm with LBBB and QRS slightly wider than prior.  Chest x-ray reveals enlarged cardiac silhouette and bibasilar atelectasis.  Radiographs of the left foot are negative.  CT of the abdomen and pelvis is negative for acute abnormality.  Chemistry panel is notable for a creatinine of 1.82, up from 1.49 earlier this month.  CBC features a leukocytosis to 23,300 and a chronic microcytic anemia with hemoglobin of 11.0.  Lactic acid is elevated to 4.79 and urinalysis is not suggestive of infection.  Blood and urine cultures were collected in the ED, 2 L normal saline administered, and the patient was treated  with Zosyn and vancomycin empirically.  He remains hemodynamically stable, in no apparent respiratory distress, and will be admitted for ongoing evaluation and management of SIRS.   Review of Systems:  All other systems reviewed and apart from HPI, are negative.  Past Medical History:  Diagnosis Date  . CAD (coronary artery disease) 2002   Nuclear, January, 2011, no ischemia  . Carotid artery disease ([place])    Doppler, February, 2012, 0-39% bilateral  . Diabetic neuropathy ([place])    Possible  . Ejection fraction    EF 50-55%, echo, January, 2011, hypokinesis mid--base inferolateral  . HTN (hypertension)   . Hx of CABG    1998  . Hyperlipidemia   . Respiratory failure ([place])    vent dependent...acquired pneumonia...2008  . Shortness of breath    2011    Past Surgical History:  Procedure Laterality Date  . CARPAL TUNNEL RELEASE    . CORONARY ARTERY BYPASS GRAFT    . KNEE ARTHROPLASTY     total  . LAPAROSCOPIC APPENDECTOMY N/A 01/10/2013   Procedure: APPENDECTOMY LAPAROSCOPIC;  Surgeon: Donato Heinz, MD;  Location: [facility];  Service: General;  Laterality: N/A;     reports that he has never smoked. He has never used smokeless tobacco. He reports that he does not drink alcohol or use drugs.  No Known Allergies  Family History  Problem Relation Age of Onset  . Heart disease Father        mother  . Heart attack  Father 56       deceased  . Heart failure Mother 11       deceased     Prior to Admission medications   Medication Sig Start Date End Date Taking? Authorizing Provider  aspirin 81 MG tablet Take 81 mg by mouth daily.   Yes [provider]  b complex vitamins capsule Take 1 capsule by mouth every other day.    Yes [provider]  donepezil (ARICEPT) 10 MG tablet TAKE 1 TABLET BY MOUTH EVERYDAY AT BEDTIME 09/13/17  Yes Stacks, Cletus Gash, MD  etodolac (LODINE) 200 MG capsule Take 1 capsule (200 mg total) by mouth daily. 09/13/17  Yes Claretta Fraise, MD   famotidine (PEPCID) 20 MG tablet Take 1 tablet (20 mg total) by mouth 2 (two) times daily. Patient taking differently: Take 20 mg by mouth daily.  09/13/17  Yes Stacks, Cletus Gash, MD  fluticasone (FLONASE) 50 MCG/ACT nasal spray Place 2 sprays into both nostrils daily. Patient taking differently: Place 2 sprays into both nostrils as needed.  01/16/16  Yes Stacks, Cletus Gash, MD  glimepiride (AMARYL) 2 MG tablet TAKE 1 TABLET (2 MG TOTAL) BY MOUTH DAILY BEFORE BREAKFAST. 09/13/17  Yes Stacks, Cletus Gash, MD  irbesartan (AVAPRO) 300 MG tablet Take 1 tablet (300 mg total) by mouth daily. 09/13/17  Yes Stacks, Cletus Gash, MD  levothyroxine (SYNTHROID, LEVOTHROID) 50 MCG tablet TAKE 1 TABLET (50 MCG TOTAL) BY MOUTH DAILY. 09/13/17  Yes Stacks, Cletus Gash, MD  metoprolol succinate (TOPROL-XL) 100 MG 24 hr tablet TAKE 1 TABLET (100 MG TOTAL) BY MOUTH DAILY. TAKE WITH OR IMMEDIATELY FOLLOWING A MEAL. 09/13/17  Yes StacksCletus Gash, MD  Multiple Vitamin (MULTIVITAMIN) tablet Take 1 tablet by mouth daily.   Yes [provider]    Physical Exam: Vitals:   09/26/17 1800 09/26/17 1930 09/26/17 2015 09/26/17 2030  BP: 127/61 (!) 136/45 (!) 113/44 137/67  Pulse: 71 72 64 71  Resp: (!) 33 (!) 26 (!) 28 (!) 29  Temp:      TempSrc:      SpO2: 91% 95% 97% 97%  Weight:      Height:          Constitutional: NAD, calm  Eyes: PERTLA, lids and conjunctivae normal ENMT: Mucous membranes are moist. Posterior pharynx clear of any exudate or lesions.   Neck: normal, supple, no masses, no thyromegaly Respiratory: clear to auscultation bilaterally, no wheezing, no crackles. Normal respiratory effort.   Cardiovascular: S1 & S2 heard, regular rate and rhythm. No significant JVD. Abdomen: No distension, no tenderness, soft. Bowel sounds normal.  Musculoskeletal: no clubbing / cyanosis. No joint deformity upper and lower extremities.   Skin: thick callous and eschar at medial 1st MTP on left without drainage or odor, minimal  surrounding erythema and edema. Warm, dry, well-perfused. Neurologic: No facial asymmetry. Sensation diminished in feet bilaterallyl, otherwise intact. Moving all extremities equally.  Psychiatric: Alert and oriented to person and place only. Very pleasant and cooperative.     Labs on Admission: I have personally reviewed following labs and imaging studies  CBC: Recent Labs  Lab 09/26/17 1551  WBC 23.3*  NEUTROABS 21.6*  HGB 11.0*  HCT 35.2*  MCV 109.7*  PLT 409   Basic Metabolic Panel: Recent Labs  Lab 09/26/17 1551  NA 139  K 4.7  CL 102  CO2 19*  GLUCOSE 207*  BUN 31*  CREATININE 1.82*  CALCIUM 8.5*   GFR: Estimated Creatinine Clearance: 23.7 mL/min (A) (by C-G formula based  on SCr of 1.82 mg/dL (H)). Liver Function Tests: Recent Labs  Lab 09/26/17 1551  AST 32  ALT 26  ALKPHOS 66  BILITOT 1.8*  PROT 5.9*  ALBUMIN 2.9*   No results for input(s): LIPASE, AMYLASE in the last 168 hours. No results for input(s): AMMONIA in the last 168 hours. Coagulation Profile: No results for input(s): INR, PROTIME in the last 168 hours. Cardiac Enzymes: No results for input(s): CKTOTAL, CKMB, CKMBINDEX, TROPONINI in the last 168 hours. BNP (last 3 results) No results for input(s): PROBNP in the last 8760 hours. HbA1C: No results for input(s): HGBA1C in the last 72 hours. CBG: No results for input(s): GLUCAP in the last 168 hours. Lipid Profile: No results for input(s): CHOL, HDL, LDLCALC, TRIG, CHOLHDL, LDLDIRECT in the last 72 hours. Thyroid Function Tests: No results for input(s): TSH, T4TOTAL, FREET4, T3FREE, THYROIDAB in the last 72 hours. Anemia Panel: No results for input(s): VITAMINB12, FOLATE, FERRITIN, TIBC, IRON, RETICCTPCT in the last 72 hours. Urine analysis:    Component Value Date/Time   COLORURINE AMBER (A) 09/26/2017 1732   APPEARANCEUR CLEAR 09/26/2017 1732   APPEARANCEUR Clear 07/21/2016 1617   LABSPEC 1.020 09/26/2017 1732   PHURINE 5.0  09/26/2017 1732   GLUCOSEU NEGATIVE 09/26/2017 1732   HGBUR NEGATIVE 09/26/2017 1732   BILIRUBINUR SMALL (A) 09/26/2017 1732   BILIRUBINUR Positive (A) 07/21/2016 1617   KETONESUR 5 (A) 09/26/2017 1732   PROTEINUR 30 (A) 09/26/2017 1732   UROBILINOGEN negative 03/12/2015 1619   NITRITE NEGATIVE 09/26/2017 1732   LEUKOCYTESUR NEGATIVE 09/26/2017 1732   LEUKOCYTESUR Negative 07/21/2016 1617   Sepsis Labs: @LABRCNTIP (procalcitonin:4,lacticidven:4) )No results found for this or any previous visit (from the past 240 hour(s)).   Radiological Exams on Admission: Ct Abdomen Pelvis Wo Contrast  Result Date: 09/26/2017 CLINICAL DATA:  Fall with low back pain EXAM: CT ABDOMEN AND PELVIS WITHOUT CONTRAST TECHNIQUE: Multidetector CT imaging of the abdomen and pelvis was performed following the standard protocol without IV contrast. COMPARISON:  01/10/2013 CT FINDINGS: Lower chest: Lung bases demonstrate small pleural effusions. No consolidation. Cardiomegaly. Coronary vascular calcification. Hepatobiliary: No focal liver abnormality is seen. No gallstones, gallbladder wall thickening, or biliary dilatation. Pancreas: Unremarkable. No pancreatic ductal dilatation or surrounding inflammatory changes. Spleen: Normal in size without focal abnormality. Adrenals/Urinary Tract: Adrenal glands are unremarkable. Kidneys are normal, without renal calculi, focal lesion, or hydronephrosis. Bladder is unremarkable. Stomach/Bowel: Stomach is within normal limits. Interval appendectomy. No evidence of bowel wall thickening, distention, or inflammatory changes. Vascular/Lymphatic: Moderate aortic atherosclerosis. No aneurysmal dilatation. No significantly enlarged lymph nodes Reproductive: Prostate is unremarkable. Other: Negative for free air or free fluid. Small fat in the inguinal canals. Musculoskeletal: Scoliosis of the spine. Trace retrolisthesis L1 on L2 and L2 on L3. Multiple level degenerative change. No acute osseous  abnormality. IMPRESSION: 1. No CT evidence for acute intra-abdominal or pelvic abnormality. 2. Cardiomegaly.  Small pleural effusions. Electronically Signed   By: Donavan Foil M.D.   On: 09/26/2017 19:42   Dg Chest 2 View  Result Date: 09/26/2017 CLINICAL DATA:  [place] 2 days ago at home, fever, history coronary disease post CABG, hypertension, diabetes mellitus, diabetic neuropathy and medial LEFT foot wound EXAM: CHEST - 2 VIEW COMPARISON:  01/10/2013 FINDINGS: Enlargement of cardiac silhouette post CABG. Mediastinal contours and pulmonary vascularity normal. Atherosclerotic calcification aorta. Decreased lung volumes with mild bibasilar atelectasis. Remaining lungs clear. No pleural effusion or pneumothorax. Bones demineralized. IMPRESSION: Enlargement of cardiac silhouette post CABG. Bibasilar atelectasis. Electronically  Signed   By: Lavonia Dana M.D.   On: 09/26/2017 15:02   Dg Foot Complete Left  Result Date: 09/26/2017 CLINICAL DATA:  [place].  Pain.  Medial wound. EXAM: LEFT FOOT - COMPLETE 3+ VIEW COMPARISON:  None. FINDINGS: There is no evidence of fracture or dislocation. There is no evidence of arthropathy or other focal bone abnormality. Soft tissues are unremarkable. IMPRESSION: Negative. Electronically Signed   By: Staci Righter M.D.   On: 09/26/2017 15:01    EKG: Independently reviewed. Sinus rhythm, LBBB, QRS slightly wider than prior.   Assessment/Plan   1. SIRS - Presents with fatigue and low back pain after recent fall  - Found to have fever, leukocytosis, and elevated lactate  - No evidence of infection on CXR or UA; appears to be breathing comfortably on rm air, abd exam benign, no meningismus  - Left foot wound does not appear acute infected  - Blood and urine cultures collected in ED, 2 liters NS given, and empiric vancomycin and Zosyn initiated  - Give additional 500 cc NS to complete 30 cc/kg bolus, continue empiric broad-spectrum abx while following cultures and clinical  course    2. Low back pain  - Has been complaining of low back pain since recent fall  - Motor function intact in b/l LE's, no saddle anesthesia  - No acute fracture noted on imaging; degenerative changes and trace retrolisthesis noted at L1  - Improved with APAP in ED  - Continue prn analgesia, request PT eval and tx    3. Left foot pressure injury  - Pressure wounds noted to left foot  - Does not appear acutely infected  - Offload foot, continue wound care   4. Dementia  - Continue Aricept   5. Type II DM  - A1c was 6.1% in February  - Managed with glimepiride at home, held on admission  - Check CBG's and use a low-intensity SSI with Novolog while in hospital   6. Hypertension  - BP at goal  - Continue Toprol, hold ARB in light of increased creatinine    7. Macrocytic anemia  - Hgb is 11.0, slightly down from 11.6 in February  - No active bleeding  - Continue b-vitamin supplementation    8. CAD - No anginal complaints  - Continue beta-blocker, hold ARB until renal fxn stabilizes   9. Hypothyroidism  - Continue Synthroid   10. CKD stage III  - SCr is 1.82 on admission, slightly up from apparent baseline  - Hold ARB and NSAID's initially, renally-dose medications, continue IVF hydration, repeat chem panel in am    DVT prophylaxis: sq heparin   Code Status: DNR  Family Communication: Daughter updated at bedside Consults called: None Admission status: Inpatient    Vianne Bulls, MD [facility] Pager [phone]  If 7PM-7AM, please contact night-coverage [email or web address] Password [facility]  09/26/2017, 8:49 PM

## 2017-09-26 NOTE — ED Provider Notes (Addendum)
[facility] EMERGENCY DEPARTMENT Provider Note   CSN: [id number] Arrival date & time: 09/26/17  1307     History   Chief Complaint Chief Complaint  Patient presents with  . Fall  . Skin Ulcer    HPI [name] [name] is a 84 y.o. male presenting for evaluation after a fall.  Level 5 caveat, patient has dementia.  Per caregiver, patient fell on Saturday, 2 days ago.  This was an unwitnessed fall, he was reportedly lying on the ground for extended period time.  Yesterday, he was able to ambulate without difficulty.  Today he was complaining of right sided flank/back pain.  Patient was able to ambulate, but stated it hurt.  No fall since Saturday.  He has not had anything for pain.  Patient has a history of Alzheimer's.  Upon my questioning of the patient, patient denies pain at this time.  When I ask if he has back pain, then he says yes.  Caregiver denies recent fevers, chills, cough, nausea, vomiting, or complaints of abdominal pain.  Denies abnormal urination or bowel movements.  Past medical history includes hypertension, CAD, CHF, alzheimer's.    HPI  Past Medical History:  Diagnosis Date  . CAD (coronary artery disease) 2002   Nuclear, January, 2011, no ischemia  . Carotid artery disease ([place])    Doppler, February, 2012, 0-39% bilateral  . Diabetic neuropathy ([place])    Possible  . Ejection fraction    EF 50-55%, echo, January, 2011, hypokinesis mid--base inferolateral  . HTN (hypertension)   . Hx of CABG    1998  . Hyperlipidemia   . Respiratory failure ([place])    vent dependent...acquired pneumonia...2008  . Shortness of breath    2011    Patient Active Problem List   Diagnosis Date Noted  . Vitamin B12 deficiency 04/16/2016  . Late onset Alzheimer's disease without behavioral disturbance 01/14/2016  . Hypothyroidism 07/26/2014  . HTN (hypertension)   . Hyperlipidemia   . CAD (coronary artery disease)   . Diabetic neuropathy ([place])   . Carotid  artery disease [facility])     Past Surgical History:  Procedure Laterality Date  . CARPAL TUNNEL RELEASE    . CORONARY ARTERY BYPASS GRAFT    . KNEE ARTHROPLASTY     total  . LAPAROSCOPIC APPENDECTOMY N/A 01/10/2013   Procedure: APPENDECTOMY LAPAROSCOPIC;  Surgeon: Donato Heinz, MD;  Location: [facility];  Service: General;  Laterality: N/A;        Home Medications    Prior to Admission medications   Medication Sig Start Date End Date Taking? Authorizing Provider  aspirin 81 MG tablet Take 81 mg by mouth daily.   Yes [provider]  b complex vitamins capsule Take 1 capsule by mouth every other day.    Yes [provider]  donepezil (ARICEPT) 10 MG tablet TAKE 1 TABLET BY MOUTH EVERYDAY AT BEDTIME 09/13/17  Yes Stacks, Cletus Gash, MD  etodolac (LODINE) 200 MG capsule Take 1 capsule (200 mg total) by mouth daily. 09/13/17  Yes Claretta Fraise, MD  famotidine (PEPCID) 20 MG tablet Take 1 tablet (20 mg total) by mouth 2 (two) times daily. Patient taking differently: Take 20 mg by mouth daily.  09/13/17  Yes Stacks, Cletus Gash, MD  fluticasone (FLONASE) 50 MCG/ACT nasal spray Place 2 sprays into both nostrils daily. Patient taking differently: Place 2 sprays into both nostrils as needed.  01/16/16  Yes Claretta Fraise, MD  glimepiride (AMARYL) 2 MG tablet TAKE  1 TABLET (2 MG TOTAL) BY MOUTH DAILY BEFORE BREAKFAST. 09/13/17  Yes Stacks, Cletus Gash, MD  irbesartan (AVAPRO) 300 MG tablet Take 1 tablet (300 mg total) by mouth daily. 09/13/17  Yes Stacks, Cletus Gash, MD  levothyroxine (SYNTHROID, LEVOTHROID) 50 MCG tablet TAKE 1 TABLET (50 MCG TOTAL) BY MOUTH DAILY. 09/13/17  Yes Stacks, Cletus Gash, MD  metoprolol succinate (TOPROL-XL) 100 MG 24 hr tablet TAKE 1 TABLET (100 MG TOTAL) BY MOUTH DAILY. TAKE WITH OR IMMEDIATELY FOLLOWING A MEAL. 09/13/17  Yes StacksCletus Gash, MD  Multiple Vitamin (MULTIVITAMIN) tablet Take 1 tablet by mouth daily.   Yes [provider]    Family History Family History   Problem Relation Age of Onset  . Heart disease Father        mother  . Heart attack Father 53       deceased  . Heart failure Mother 63       deceased    Social History Social History   Tobacco Use  . Smoking status: Never Smoker  . Smokeless tobacco: Never Used  Substance Use Topics  . Alcohol use: No  . Drug use: No     Allergies   Patient has no known allergies.   Review of Systems Review of Systems  Musculoskeletal: Positive for back pain.  Hematological: Does not bruise/bleed easily.  All other systems reviewed and are negative.    Physical Exam Updated Vital Signs BP 127/61   Pulse 71   Temp 97.7 F (36.5 C) (Oral)   Resp (!) 33   Ht 5\' 9"  (1.753 m)   Wt 78.5 kg (173 lb)   SpO2 91%   BMI 25.55 kg/m   Physical Exam  Constitutional: He is oriented to person, place, and time. He appears well-developed and well-nourished.  Elderly, chronically ill-appearing male who feels warm to the touch.  HENT:  Head: Normocephalic and atraumatic.  Mouth/Throat: Uvula is midline and oropharynx is clear and moist. Mucous membranes are dry.  Eyes: Pupils are equal, round, and reactive to light. Conjunctivae and EOM are normal.  Neck: Normal range of motion. Neck supple.  Cardiovascular: Normal rate, regular rhythm and intact distal pulses.  Pulmonary/Chest: Effort normal and breath sounds normal. No respiratory distress. He has no wheezes.  Abdominal: Soft. He exhibits no distension and no mass. There is tenderness. There is no rebound and no guarding.  Diffuse tenderness on exam without rigidity, guarding, or distention.  No obvious masses.  Musculoskeletal: Normal range of motion. He exhibits no tenderness.  No reproducible tenderness palpation of the back or flank.  Patient ambulated without difficulty.  No obvious injury.  Neurological: He is alert and oriented to person, place, and time.  Skin: Skin is warm and dry. Capillary refill takes less than 2 seconds.    Psychiatric: He has a normal mood and affect.  Nursing note and vitals reviewed.    ED Treatments / Results  Labs (all labs ordered are listed, but only abnormal results are displayed) Labs Reviewed  CBC WITH DIFFERENTIAL/PLATELET - Abnormal; Notable for the following components:      Result Value   WBC 23.3 (*)    RBC 3.21 (*)    Hemoglobin 11.0 (*)    HCT 35.2 (*)    MCV 109.7 (*)    MCH 34.3 (*)    Neutro Abs 21.6 (*)    Lymphs Abs 0.4 (*)    Abs Immature Granulocytes 0.4 (*)    All other components within normal limits  COMPREHENSIVE METABOLIC PANEL - Abnormal; Notable for the following components:   CO2 19 (*)    Glucose, Bld 207 (*)    BUN 31 (*)    Creatinine, Ser 1.82 (*)    Calcium 8.5 (*)    Total Protein 5.9 (*)    Albumin 2.9 (*)    Total Bilirubin 1.8 (*)    GFR calc non Af Amer 30 (*)    GFR calc Af Amer 34 (*)    Anion gap 18 (*)    All other components within normal limits  URINALYSIS, ROUTINE W REFLEX MICROSCOPIC - Abnormal; Notable for the following components:   Color, Urine AMBER (*)    Bilirubin Urine SMALL (*)    Ketones, ur 5 (*)    Protein, ur 30 (*)    All other components within normal limits  I-STAT CG4 LACTIC ACID, ED - Abnormal; Notable for the following components:   Lactic Acid, Venous 4.79 (*)    All other components within normal limits  URINE CULTURE  CULTURE, BLOOD (ROUTINE X 2)  CULTURE, BLOOD (ROUTINE X 2)  I-STAT CG4 LACTIC ACID, ED    EKG None  Radiology Dg Chest 2 View  Result Date: 09/26/2017 CLINICAL DATA:  [place] 2 days ago at home, fever, history coronary disease post CABG, hypertension, diabetes mellitus, diabetic neuropathy and medial LEFT foot wound EXAM: CHEST - 2 VIEW COMPARISON:  01/10/2013 FINDINGS: Enlargement of cardiac silhouette post CABG. Mediastinal contours and pulmonary vascularity normal. Atherosclerotic calcification aorta. Decreased lung volumes with mild bibasilar atelectasis. Remaining lungs clear.  No pleural effusion or pneumothorax. Bones demineralized. IMPRESSION: Enlargement of cardiac silhouette post CABG. Bibasilar atelectasis. Electronically Signed   By: Lavonia Dana M.D.   On: 09/26/2017 15:02   Dg Foot Complete Left  Result Date: 09/26/2017 CLINICAL DATA:  [place].  Pain.  Medial wound. EXAM: LEFT FOOT - COMPLETE 3+ VIEW COMPARISON:  None. FINDINGS: There is no evidence of fracture or dislocation. There is no evidence of arthropathy or other focal bone abnormality. Soft tissues are unremarkable. IMPRESSION: Negative. Electronically Signed   By: Staci Righter M.D.   On: 09/26/2017 15:01    Procedures .Critical Care Performed by: Franchot Heidelberg, PA-C Authorized by: Franchot Heidelberg, PA-C   Critical care provider statement:    Critical care time (minutes):  35   Critical care time was exclusive of:  Separately billable procedures and treating other patients and teaching time   Critical care was necessary to treat or prevent imminent or life-threatening deterioration of the following conditions:  Sepsis   Critical care was time spent personally by me on the following activities:  Blood draw for specimens, development of treatment plan with patient or surrogate, discussions with consultants, evaluation of patient's response to treatment, examination of patient, obtaining history from patient or surrogate, ordering and performing treatments and interventions, ordering and review of radiographic studies, ordering and review of laboratory studies, pulse oximetry, re-evaluation of patient's condition and review of old charts   I assumed direction of critical care for this patient from another provider in my specialty: no   Comments:     Code sepsis called on pt. abx and fluids started   (including critical care time)  Medications Ordered in ED Medications  0.9 %  sodium chloride infusion (has no administration in time range)  sodium chloride 0.9 % bolus 2,000 mL (has no administration  in time range)  vancomycin (VANCOCIN) IVPB 1000 mg/200 mL premix (has no administration in time  range)  piperacillin-tazobactam (ZOSYN) IVPB 3.375 g (has no administration in time range)  acetaminophen (TYLENOL) tablet 650 mg (650 mg Oral Given 09/26/17 1404)     Initial Impression / Assessment and Plan / ED Course  I have reviewed the triage vital signs and the nursing notes.  Pertinent labs & imaging results that were available during my care of the patient were reviewed by me and considered in my medical decision making (see chart for details).     Patient presenting for evaluation after a fall.  Physical exam shows patient who is warm to the touch, and has been complaining of back pain.  Rectal temp was obtained, patient with temperature of 101.5.  Will give Tylenol for pain and fever.  Labs, urine, and chest x-ray ordered.  Labs concerning, with leukocytosis of 23.3.  Will order lactic stick.  Urine reassuring without obvious infection.  CMP shows mild AKI and dehydration.  Will order CT abdomen for further evaluation of back, spine, kidneys, and abdomen.  [place] discussed with attending, Dr. Roderic Palau evaluated the patient.  As patient has an elevated lactic, elevated white count, and fever, he meets sepsis criteria.  Code sepsis called and antibiotics initiated.  Will call for admission.  Dicussed with Dr. Myna Hidalgo, will obtain CT results prior to admission.  CT reassuring, no obvious intra-abdominal infection, spinal involvement, or kidney involvement.  Concern for pneumonia that had yet to blossom on chest x-ray due to dehydration.  On talking with the caregivers further, patient has been coughing recently.  Discussed with Dr. Myna Hidalgo, who will admit patient to [facility] service.   Final Clinical Impressions(s) / ED Diagnoses   Final diagnoses:  Sepsis, due to unspecified organism [facility])  Dehydration  AKI (acute kidney injury) [facility])    ED Discharge Orders    None       Franchot Heidelberg,  PA-C 09/26/17 1959    Franchot Heidelberg, PA-C 09/26/17 2300    Milton Ferguson, MD 09/29/17 [phone]

## 2017-09-26 NOTE — ED Triage Notes (Signed)
Arrived via EMS from home. Patient fell 2 days ago developed lower back pain radiating to bilateral lower extremities. Family and care taker noticed patient not moving around as normal.  EMS reported skin issues left foot.  Patient has a history of dementia alert to normal.

## 2017-09-27 ENCOUNTER — Other Ambulatory Visit: Payer: Self-pay

## 2017-09-27 ENCOUNTER — Inpatient Hospital Stay (HOSPITAL_COMMUNITY): Payer: Medicare Other

## 2017-09-27 DIAGNOSIS — I361 Nonrheumatic tricuspid (valve) insufficiency: Secondary | ICD-10-CM

## 2017-09-27 DIAGNOSIS — B9561 Methicillin susceptible Staphylococcus aureus infection as the cause of diseases classified elsewhere: Secondary | ICD-10-CM | POA: Diagnosis present

## 2017-09-27 DIAGNOSIS — G309 Alzheimer's disease, unspecified: Secondary | ICD-10-CM

## 2017-09-27 DIAGNOSIS — B9562 Methicillin resistant Staphylococcus aureus infection as the cause of diseases classified elsewhere: Secondary | ICD-10-CM

## 2017-09-27 DIAGNOSIS — Z8249 Family history of ischemic heart disease and other diseases of the circulatory system: Secondary | ICD-10-CM

## 2017-09-27 DIAGNOSIS — E1140 Type 2 diabetes mellitus with diabetic neuropathy, unspecified: Secondary | ICD-10-CM

## 2017-09-27 DIAGNOSIS — I10 Essential (primary) hypertension: Secondary | ICD-10-CM

## 2017-09-27 DIAGNOSIS — S90822A Blister (nonthermal), left foot, initial encounter: Secondary | ICD-10-CM

## 2017-09-27 DIAGNOSIS — W19XXXA Unspecified fall, initial encounter: Secondary | ICD-10-CM

## 2017-09-27 DIAGNOSIS — R7881 Bacteremia: Secondary | ICD-10-CM

## 2017-09-27 LAB — BLOOD CULTURE ID PANEL (REFLEXED)
Acinetobacter baumannii: NOT DETECTED
CANDIDA GLABRATA: NOT DETECTED
CANDIDA TROPICALIS: NOT DETECTED
Candida albicans: NOT DETECTED
Candida krusei: NOT DETECTED
Candida parapsilosis: NOT DETECTED
ENTEROBACTER CLOACAE COMPLEX: NOT DETECTED
Enterobacteriaceae species: NOT DETECTED
Enterococcus species: NOT DETECTED
Escherichia coli: NOT DETECTED
HAEMOPHILUS INFLUENZAE: NOT DETECTED
KLEBSIELLA PNEUMONIAE: NOT DETECTED
Klebsiella oxytoca: NOT DETECTED
Listeria monocytogenes: NOT DETECTED
Methicillin resistance: NOT DETECTED
Neisseria meningitidis: NOT DETECTED
PROTEUS SPECIES: NOT DETECTED
Pseudomonas aeruginosa: NOT DETECTED
SERRATIA MARCESCENS: NOT DETECTED
STAPHYLOCOCCUS AUREUS BCID: DETECTED — AB
STAPHYLOCOCCUS SPECIES: DETECTED — AB
Streptococcus agalactiae: NOT DETECTED
Streptococcus pneumoniae: NOT DETECTED
Streptococcus pyogenes: NOT DETECTED
Streptococcus species: NOT DETECTED

## 2017-09-27 LAB — CBC WITH DIFFERENTIAL/PLATELET
BASOS ABS: 0 10*3/uL (ref 0.0–0.1)
BASOS PCT: 0 %
EOS ABS: 0 10*3/uL (ref 0.0–0.7)
Eosinophils Relative: 0 %
HCT: 29.8 % — ABNORMAL LOW (ref 39.0–52.0)
Hemoglobin: 9.7 g/dL — ABNORMAL LOW (ref 13.0–17.0)
LYMPHS PCT: 2 %
Lymphs Abs: 0.4 10*3/uL — ABNORMAL LOW (ref 0.7–4.0)
MCH: 33.7 pg (ref 26.0–34.0)
MCHC: 32.6 g/dL (ref 30.0–36.0)
MCV: 103.5 fL — AB (ref 78.0–100.0)
Monocytes Absolute: 0.8 10*3/uL (ref 0.1–1.0)
Monocytes Relative: 4 %
NEUTROS PCT: 94 %
Neutro Abs: 17.7 10*3/uL — ABNORMAL HIGH (ref 1.7–7.7)
PLATELETS: 177 10*3/uL (ref 150–400)
RBC: 2.88 MIL/uL — AB (ref 4.22–5.81)
RDW: 14 % (ref 11.5–15.5)
WBC: 18.9 10*3/uL — AB (ref 4.0–10.5)

## 2017-09-27 LAB — COMPREHENSIVE METABOLIC PANEL
ALK PHOS: 55 U/L (ref 38–126)
ALT: 23 U/L (ref 0–44)
ANION GAP: 14 (ref 5–15)
AST: 29 U/L (ref 15–41)
Albumin: 2.5 g/dL — ABNORMAL LOW (ref 3.5–5.0)
BUN: 31 mg/dL — ABNORMAL HIGH (ref 8–23)
CHLORIDE: 106 mmol/L (ref 98–111)
CO2: 17 mmol/L — AB (ref 22–32)
Calcium: 7.7 mg/dL — ABNORMAL LOW (ref 8.9–10.3)
Creatinine, Ser: 1.69 mg/dL — ABNORMAL HIGH (ref 0.61–1.24)
GFR, EST AFRICAN AMERICAN: 38 mL/min — AB (ref >60)
GFR, EST NON AFRICAN AMERICAN: 32 mL/min — AB (ref >60)
Glucose, Bld: 262 mg/dL — ABNORMAL HIGH (ref 70–99)
Potassium: 4.2 mmol/L (ref 3.5–5.1)
SODIUM: 137 mmol/L (ref 135–145)
Total Bilirubin: 1.4 mg/dL — ABNORMAL HIGH (ref 0.3–1.2)
Total Protein: 5.3 g/dL — ABNORMAL LOW (ref 6.5–8.1)

## 2017-09-27 LAB — GLUCOSE, CAPILLARY
GLUCOSE-CAPILLARY: 163 mg/dL — AB (ref 70–99)
GLUCOSE-CAPILLARY: 174 mg/dL — AB (ref 70–99)
GLUCOSE-CAPILLARY: 139 mg/dL — AB (ref 70–99)
Glucose-Capillary: 211 mg/dL — ABNORMAL HIGH (ref 70–99)

## 2017-09-27 LAB — ECHOCARDIOGRAM COMPLETE
Height: 69 in
Weight: 2786.61 oz

## 2017-09-27 LAB — LACTIC ACID, PLASMA: LACTIC ACID, VENOUS: 5.6 mmol/L — AB (ref 0.5–1.9)

## 2017-09-27 LAB — URINE CULTURE: Culture: <10000 — AB

## 2017-09-27 MED ORDER — CEFAZOLIN SODIUM-DEXTROSE 2-4 GM/100ML-% IV SOLN
2.0000 g | Freq: Two times a day (BID) | INTRAVENOUS | Status: DC
  Administered 2017-09-27 – 2017-10-03 (×13): 2 g via INTRAVENOUS
  Filled 2017-09-27 (×13): qty 100

## 2017-09-27 MED ORDER — METOPROLOL SUCCINATE ER 50 MG PO TB24
50.0000 mg | ORAL_TABLET | Freq: Every day | ORAL | Status: DC
  Administered 2017-09-28 – 2017-10-02 (×5): 50 mg via ORAL
  Filled 2017-09-27 (×5): qty 1

## 2017-09-27 MED ORDER — SODIUM CHLORIDE 0.9 % IV SOLN
INTRAVENOUS | Status: DC
  Administered 2017-09-27 – 2017-09-29 (×5): via INTRAVENOUS

## 2017-09-27 NOTE — Progress Notes (Signed)
CRITICAL VALUE ALERT  Critical Value:  Lactic acid 5.6  Date & Time Notied:  09/27/17 0134  Provider Notified: Dr. Myna Hidalgo  Orders Received/Actions taken: No new orders

## 2017-09-27 NOTE — Progress Notes (Signed)
Patient worked with [place] to chair. Sinus brady down to 39 and some PVC's for no more than a minute. Saw patient who is in chair and he stated he just feels bad overall. RN aware and will page Dr.

## 2017-09-27 NOTE — Progress Notes (Signed)
PROGRESS NOTE    [name] [name]  LFY:[id number] DOB: 13-Nov-1918 DOA: 09/26/2017 PCP: Claretta Fraise, MD     Brief Narrative:  [name] [name] is a 84 y.o. male with medical history significant for dementia, hypertension, hypothyroidism, coronary artery disease, and type 2 diabetes mellitus, now presenting to the emergency department for evaluation of fatigue and low back pain.  Patient lives at home with his wife who also has dementia. They have caregivers come up about 8 hours a day for assistance. He was noted to be sleeping most of the day for the past couple days.  He had suffered a ground-level mechanical fall a few days ago and had been complaining of low back pain since then.  He has not been complaining of abdominal pain and there has not been any diarrhea or vomiting reported.  Patient may have had a mild cough, but has not appeared short of breath.  History is obtained through EMR and report of the patient's daughter at the bedside.   New events last 24 hours / Subjective: No acute events, patient with dementia, resting comfortably in bed. Daughter at bedside denies any new issues since admission. She notes that they have been looking into long-term care unit as he currently resides with his wife who has dementia as well and she has noticed gradual decline in the past few months. Also points out wound of left foot that started as a blister 3 weeks ago.   Assessment & Plan:   Principal Problem:   MSSA bacteremia Active Problems:   HTN (hypertension)   CAD (coronary artery disease)   Hypothyroidism   Late onset Alzheimer's disease without behavioral disturbance   SIRS (systemic inflammatory response syndrome) (HCC)   Diabetes mellitus type II, non insulin dependent (HCC)   Macrocytic anemia   Low back pain   Pressure injury of deep tissue of left foot   CKD (chronic kidney disease), stage III (HCC)   Severe sepsis secondary to MSSA bacteremia -?Source left foot. Wound care  consulted  -Unclear if he would tolerate MRI. Xray was negative for osseous abnormality  -Ancef -ID consulted -Echo pending   Dementia -Continue aricept  DM type 2 with hyperglycemia  -SSI  HTN -Continue toprol  CAD -Continue toprol, aspirin   Hypothyroidism -Continue synthroid  CKD stage 3 -Baseline Cr ~1.2-1.5 -Stable    DVT prophylaxis: Subq hep Code Status: DNR Family Communication: Daughter at bedside Disposition Plan: Pending work up and improvement   Consultants:   ID   Procedures:   None   Antimicrobials:  Anti-infectives (From admission, onward)   Start     Dose/Rate Route Frequency Ordered Stop   09/27/17 1200  vancomycin (VANCOCIN) IVPB 750 mg/150 ml premix  Status:  Discontinued     750 mg 150 mL/hr over 60 Minutes Intravenous Every 24 hours 09/26/17 2054 09/27/17 0735   09/27/17 1000  ceFAZolin (ANCEF) IVPB 2g/100 mL premix     2 g 200 mL/hr over 30 Minutes Intravenous Every 12 hours 09/27/17 0737     09/27/17 0000  ceFEPIme (MAXIPIME) 1 g in sodium chloride 0.9 % 100 mL IVPB  Status:  Discontinued     1 g 200 mL/hr over 30 Minutes Intravenous Every 24 hours 09/26/17 2054 09/27/17 0735   09/26/17 1815  vancomycin (VANCOCIN) IVPB 1000 mg/200 mL premix     1,000 mg 200 mL/hr over 60 Minutes Intravenous  Once 09/26/17 1813 09/26/17 2051   09/26/17 1815  piperacillin-tazobactam (ZOSYN) IVPB 3.375  g     3.375 g 100 mL/hr over 30 Minutes Intravenous  Once 09/26/17 1813 09/26/17 1934       Objective: Vitals:   09/26/17 2157 09/27/17 0009 09/27/17 0500 09/27/17 0534  BP: 135/63 130/62  130/70  Pulse: 84 80  77  Resp: 20   17  Temp: 97.9 F (36.6 C)   98.3 F (36.8 C)  TempSrc:      SpO2: 95%   95%  Weight:   79 kg (174 lb 2.6 oz)   Height:        Intake/Output Summary (Last 24 hours) at 09/27/2017 1315 Last data filed at 09/27/2017 0421 Gross per 24 hour  Intake 3007.33 ml  Output -  Net 3007.33 ml   Filed Weights   09/26/17 1314  09/27/17 0500  Weight: 78.5 kg (173 lb) 79 kg (174 lb 2.6 oz)    Examination:  General exam: Appears calm and comfortable  Respiratory system: Clear to auscultation. Respiratory effort normal. Cardiovascular system: S1 & S2 heard, Irreg rhythm. No JVD, murmurs, rubs, gallops or clicks. No pedal edema. Gastrointestinal system: Abdomen is nondistended, soft and nontender. No organomegaly or masses felt. Normal bowel sounds heard. Central nervous system: Alert  Extremities: Symmetric Skin:    Psychiatry: Dementia   Data Reviewed: I have personally reviewed following labs and imaging studies  CBC: Recent Labs  Lab 09/26/17 1551 09/27/17 0042  WBC 23.3* 18.9*  NEUTROABS 21.6* 17.7*  HGB 11.0* 9.7*  HCT 35.2* 29.8*  MCV 109.7* 103.5*  PLT 175 098   Basic Metabolic Panel: Recent Labs  Lab 09/26/17 1551 09/27/17 0042  NA 139 137  K 4.7 4.2  CL 102 106  CO2 19* 17*  GLUCOSE 207* 262*  BUN 31* 31*  CREATININE 1.82* 1.69*  CALCIUM 8.5* 7.7*   GFR: Estimated Creatinine Clearance: 25.6 mL/min (A) (by C-G formula based on SCr of 1.69 mg/dL (H)). Liver Function Tests: Recent Labs  Lab 09/26/17 1551 09/27/17 0042  AST 32 29  ALT 26 23  ALKPHOS 66 55  BILITOT 1.8* 1.4*  PROT 5.9* 5.3*  ALBUMIN 2.9* 2.5*   No results for input(s): LIPASE, AMYLASE in the last 168 hours. No results for input(s): AMMONIA in the last 168 hours. Coagulation Profile: Recent Labs  Lab 09/26/17 2212  INR 1.56   Cardiac Enzymes: Recent Labs  Lab 09/26/17 2212  CKTOTAL 113   BNP (last 3 results) No results for input(s): PROBNP in the last 8760 hours. HbA1C: No results for input(s): HGBA1C in the last 72 hours. CBG: Recent Labs  Lab 09/26/17 2159 09/27/17 0818 09/27/17 1224  GLUCAP 211* 163* 211*   Lipid Profile: No results for input(s): CHOL, HDL, LDLCALC, TRIG, CHOLHDL, LDLDIRECT in the last 72 hours. Thyroid Function Tests: No results for input(s): TSH, T4TOTAL, FREET4,  T3FREE, THYROIDAB in the last 72 hours. Anemia Panel: No results for input(s): VITAMINB12, FOLATE, FERRITIN, TIBC, IRON, RETICCTPCT in the last 72 hours. Sepsis Labs: Recent Labs  Lab 09/26/17 1759 09/26/17 1847 09/26/17 2212 09/27/17 0042  PROCALCITON  --   --  2.20  --   LATICACIDVEN 4.79* 4.95* 6.4* 5.6*    Recent Results (from the past 240 hour(s))  Blood Culture (routine x 2)     Status: None (Preliminary result)   Collection Time: 09/26/17  6:41 PM  Result Value Ref Range Status   Specimen Description BLOOD RIGHT ANTECUBITAL  Final   Special Requests   Final    BOTTLES DRAWN  AEROBIC AND ANAEROBIC Blood Culture results may not be optimal due to an excessive volume of blood received in culture bottles   Culture  Setup Time   Final    IN BOTH AEROBIC AND ANAEROBIC BOTTLES GRAM POSITIVE COCCI CRITICAL RESULT CALLED TO, READ BACK BY AND VERIFIED WITH: C BENDER PHARMD 09/27/17 1443 JDW Performed at [facility], [place] [address] [place], [place] [address]    Culture GRAM POSITIVE COCCI  Final   Report Status PENDING  Incomplete  Blood Culture ID Panel (Reflexed)     Status: Abnormal   Collection Time: 09/26/17  6:41 PM  Result Value Ref Range Status   Enterococcus species NOT DETECTED NOT DETECTED Final   Listeria monocytogenes NOT DETECTED NOT DETECTED Final   Staphylococcus species DETECTED (A) NOT DETECTED Final    Comment: CRITICAL RESULT CALLED TO, READ BACK BY AND VERIFIED WITH: C BENDER PHARMD 09/27/17 0721 JDW    Staphylococcus aureus DETECTED (A) NOT DETECTED Final    Comment: Methicillin (oxacillin) susceptible Staphylococcus aureus (MSSA). Preferred therapy is anti staphylococcal beta lactam antibiotic (Cefazolin or Nafcillin), unless clinically contraindicated. CRITICAL RESULT CALLED TO, READ BACK BY AND VERIFIED WITH: C BENDER PHARMD 09/27/17 0721 JDW    Methicillin resistance NOT DETECTED NOT DETECTED Final   Streptococcus species NOT DETECTED NOT DETECTED  Final   Streptococcus agalactiae NOT DETECTED NOT DETECTED Final   Streptococcus pneumoniae NOT DETECTED NOT DETECTED Final   Streptococcus pyogenes NOT DETECTED NOT DETECTED Final   Acinetobacter baumannii NOT DETECTED NOT DETECTED Final   Enterobacteriaceae species NOT DETECTED NOT DETECTED Final   Enterobacter cloacae complex NOT DETECTED NOT DETECTED Final   Escherichia coli NOT DETECTED NOT DETECTED Final   Klebsiella oxytoca NOT DETECTED NOT DETECTED Final   Klebsiella pneumoniae NOT DETECTED NOT DETECTED Final   Proteus species NOT DETECTED NOT DETECTED Final   Serratia marcescens NOT DETECTED NOT DETECTED Final   Haemophilus influenzae NOT DETECTED NOT DETECTED Final   Neisseria meningitidis NOT DETECTED NOT DETECTED Final   Pseudomonas aeruginosa NOT DETECTED NOT DETECTED Final   Candida albicans NOT DETECTED NOT DETECTED Final   Candida glabrata NOT DETECTED NOT DETECTED Final   Candida krusei NOT DETECTED NOT DETECTED Final   Candida parapsilosis NOT DETECTED NOT DETECTED Final   Candida tropicalis NOT DETECTED NOT DETECTED Final       Radiology Studies: Ct Abdomen Pelvis Wo Contrast  Result Date: 09/26/2017 CLINICAL DATA:  Fall with low back pain EXAM: CT ABDOMEN AND PELVIS WITHOUT CONTRAST TECHNIQUE: Multidetector CT imaging of the abdomen and pelvis was performed following the standard protocol without IV contrast. COMPARISON:  01/10/2013 CT FINDINGS: Lower chest: Lung bases demonstrate small pleural effusions. No consolidation. Cardiomegaly. Coronary vascular calcification. Hepatobiliary: No focal liver abnormality is seen. No gallstones, gallbladder wall thickening, or biliary dilatation. Pancreas: Unremarkable. No pancreatic ductal dilatation or surrounding inflammatory changes. Spleen: Normal in size without focal abnormality. Adrenals/Urinary Tract: Adrenal glands are unremarkable. Kidneys are normal, without renal calculi, focal lesion, or hydronephrosis. Bladder is  unremarkable. Stomach/Bowel: Stomach is within normal limits. Interval appendectomy. No evidence of bowel wall thickening, distention, or inflammatory changes. Vascular/Lymphatic: Moderate aortic atherosclerosis. No aneurysmal dilatation. No significantly enlarged lymph nodes Reproductive: Prostate is unremarkable. Other: Negative for free air or free fluid. Small fat in the inguinal canals. Musculoskeletal: Scoliosis of the spine. Trace retrolisthesis L1 on L2 and L2 on L3. Multiple level degenerative change. No acute osseous abnormality. IMPRESSION: 1. No CT evidence for acute  intra-abdominal or pelvic abnormality. 2. Cardiomegaly.  Small pleural effusions. Electronically Signed   By: Donavan Foil M.D.   On: 09/26/2017 19:42   Dg Chest 2 View  Result Date: 09/26/2017 CLINICAL DATA:  [place] 2 days ago at home, fever, history coronary disease post CABG, hypertension, diabetes mellitus, diabetic neuropathy and medial LEFT foot wound EXAM: CHEST - 2 VIEW COMPARISON:  01/10/2013 FINDINGS: Enlargement of cardiac silhouette post CABG. Mediastinal contours and pulmonary vascularity normal. Atherosclerotic calcification aorta. Decreased lung volumes with mild bibasilar atelectasis. Remaining lungs clear. No pleural effusion or pneumothorax. Bones demineralized. IMPRESSION: Enlargement of cardiac silhouette post CABG. Bibasilar atelectasis. Electronically Signed   By: Lavonia Dana M.D.   On: 09/26/2017 15:02   Dg Foot Complete Left  Result Date: 09/26/2017 CLINICAL DATA:  [place].  Pain.  Medial wound. EXAM: LEFT FOOT - COMPLETE 3+ VIEW COMPARISON:  None. FINDINGS: There is no evidence of fracture or dislocation. There is no evidence of arthropathy or other focal bone abnormality. Soft tissues are unremarkable. IMPRESSION: Negative. Electronically Signed   By: Staci Righter M.D.   On: 09/26/2017 15:01      Scheduled Meds: . aspirin EC  81 mg Oral Daily  . [START ON 09/28/2017] B-complex with vitamin C  1 tablet Oral  QODAY  . donepezil  10 mg Oral QHS  . famotidine  20 mg Oral Daily  . heparin  5,000 Units Subcutaneous Q8H  . insulin aspart  0-5 Units Subcutaneous QHS  . insulin aspart  0-9 Units Subcutaneous TID WC  . levothyroxine  50 mcg Oral QAC breakfast  . metoprolol succinate  100 mg Oral Daily  . multivitamin with minerals  1 tablet Oral Daily  . sodium chloride flush  3 mL Intravenous Q12H   Continuous Infusions: . sodium chloride 100 mL/hr at 09/27/17 0947  .  ceFAZolin (ANCEF) IV 2 g (09/27/17 0950)     LOS: 1 day    Time spent: 35 minutes   Dessa Phi, DO [facility] [email or web address] Password [email or web address] 09/27/2017, 1:15 PM

## 2017-09-27 NOTE — Evaluation (Signed)
Occupational Therapy Evaluation Patient Details Name: [name] [name] MRN: [id number] DOB: [birth year] Today's Date: 09/27/2017    History of Present Illness Pt is a 84 y.o. male presenting with fatigue and low back pain. Pt also noted to have a fall at home a few days prior to admission. PMHx: Dementia, HTN, Hypothyroidism, CAD, DM2.   Clinical Impression   Per daughter, pt requiring increased assist with ADL over the past 2 weeks PTA. Currently pt requires mod assist for sit to stand from chair but fatigues quickly with activity. Pt needs mod-max assist for ADL. Recommending SNF for follow up to maximize independence and safety with ADL and functional mobility. Pt would benefit from continued skilled OT to address established goals.    Follow Up Recommendations  SNF;Supervision/Assistance - 24 hour    Equipment Recommendations  Other (comment)(TBD)    Recommendations for Other Services       Precautions / Restrictions Precautions Precautions: Fall Restrictions Weight Bearing Restrictions: No      Mobility Bed Mobility               General bed mobility comments: Pt OOB in chair upon arrival  Transfers Overall transfer level: Needs assistance Equipment used: Rolling walker (2 wheeled) Transfers: Sit to/from Stand Sit to Stand: Mod assist         General transfer comment: Mod assist to boost up from chair. Cues for hand placement and technique    Balance Overall balance assessment: Needs assistance Sitting-balance support: Feet supported Sitting balance-Leahy Scale: Fair     Standing balance support: Bilateral upper extremity supported Standing balance-Leahy Scale: Poor Standing balance comment: RW for support                           ADL either performed or assessed with clinical judgement   ADL Overall ADL's : Needs assistance/impaired Eating/Feeding: Minimal assistance;Sitting Eating/Feeding Details (indicate cue type and reason): daughter  assisting with self feeding lunch upon arrival Grooming: Minimal assistance;Sitting   Upper Body Bathing: Moderate assistance;Sitting   Lower Body Bathing: Maximal assistance;Sit to/from stand   Upper Body Dressing : Moderate assistance;Sitting   Lower Body Dressing: Maximal assistance;Sit to/from stand                 General ADL Comments: Pt able to perform sit to stand from chair x1 with mod assist. Pt fatigues quickly in standing.     Vision         Perception     Praxis      Pertinent Vitals/Pain Pain Assessment: Faces Faces Pain Scale: Hurts a little bit Pain Location: L flank Pain Descriptors / Indicators: Discomfort Pain Intervention(s): Limited activity within patient's tolerance;Monitored during session     Hand Dominance Right   Extremity/Trunk Assessment Upper Extremity Assessment Upper Extremity Assessment: Generalized weakness   Lower Extremity Assessment Lower Extremity Assessment: Defer to PT evaluation   Cervical / Trunk Assessment Cervical / Trunk Assessment: Kyphotic   Communication Communication Communication: HOH   Cognition Arousal/Alertness: Awake/alert Behavior During Therapy: Flat affect Overall Cognitive Status: History of cognitive impairments - at baseline                                 General Comments: Per daughter, pt at his cognitive baseline   General Comments       Exercises     Shoulder Instructions  Home Living Family/patient expects to be discharged to:: Private residence Living Arrangements: Spouse/significant other Available Help at Discharge: Family;Personal care attendant;Available 24 hours/day Type of Home: House Home Access: Stairs to enter [employer] of Steps: 5 Entrance Stairs-Rails: Can reach both Home Layout: One level     Bathroom Shower/Tub: Occupational psychologist: Standard     Home Equipment: Environmental consultant - 2 wheels;Cane - single point   Additional  Comments: Wife with dementia, caregivers only assisting during the day      Prior Functioning/Environment Level of Independence: Needs assistance  Gait / Transfers Assistance Needed: has been using a walker in the home ADL's / Homemaking Assistance Needed: caregiver assisting with bathing/dressing recently. Prior to ~2 weeks ago, pt able to perform most of BADL without physical assist   Comments: has caregiver that comes in every day for 10 hours        OT Problem List: Decreased strength;Decreased activity tolerance;Impaired balance (sitting and/or standing);Decreased cognition;Decreased knowledge of use of DME or AE      OT Treatment/Interventions: Self-care/ADL training;Therapeutic exercise;Energy conservation;DME and/or AE instruction;Therapeutic activities;Patient/family education;Balance training    OT Goals(Current goals can be found in the care plan section) Acute Rehab OT Goals Patient Stated Goal: per daughter; pt to rehab OT Goal Formulation: With family Time For Goal Achievement: 10/11/17 Potential to Achieve Goals: Good ADL Goals Pt Will Perform Grooming: with set-up;sitting;with supervision Pt Will Perform Upper Body Bathing: with min guard assist;sitting Pt Will Perform Lower Body Bathing: with min assist;sit to/from stand Pt Will Transfer to Toilet: with min assist;stand pivot transfer;bedside commode Pt Will Perform Toileting - Clothing Manipulation and hygiene: with min assist;sit to/from stand  OT Frequency: Min 2X/week   Barriers to D/C: Decreased caregiver support          Co-evaluation              AM-PAC PT "6 Clicks" Daily Activity     Outcome Measure Help from another person eating meals?: A Little Help from another person taking care of personal grooming?: A Little Help from another person toileting, which includes using toliet, bedpan, or urinal?: A Lot Help from another person bathing (including washing, rinsing, drying)?: A Lot Help from  another person to put on and taking off regular upper body clothing?: A Lot Help from another person to put on and taking off regular lower body clothing?: A Lot 6 Click Score: 14   End of Session Equipment Utilized During Treatment: Rolling walker  Activity Tolerance: Patient tolerated treatment well Patient left: in chair;with call bell/phone within reach;with chair alarm set;with family/visitor present  OT Visit Diagnosis: Unsteadiness on feet (R26.81);History of falling (Z91.81);Muscle weakness (generalized) (M62.81)                Time: 1345-1405 OT Time Calculation (min): 20 min Charges:  OT General Charges $OT Visit: 1 Visit OT Evaluation $OT Eval Moderate Complexity: 1 Mod G-Codes:     Narya Beavin A. Ulice Brilliant, M.S., OTR/L Acute Rehab Department: [phone]  Binnie Kand 09/27/2017, 4:22 PM

## 2017-09-27 NOTE — Consult Note (Addendum)
[facility] Nurse wound consult note Reason for Consult: Nonhealing wounds to left foot. X ray ruled out osteomyelitis .  Patient has been to wound center x 1 and was using Iodosorb and family member does not feel it has improved.  Patient has an offloading shoe for pressure redistribution.  Wound type:Nonhealing neuropathic ulcers to foot. Unstageable pressure injuries to left  Pressure Injury POA: Yes Measurement:Left medial foot distal to great toe:   4 cm x 3.4 cm boggy eschar Linear abrasion distal to this open area, 6 cm in length, scabbed.  This is new, per family member at bedside Left lateral foot below 5th metatarsal: 2 cm x 1.4 cm x 0.2 cm This is new, per family member at bedside.  There was a fall at home.  Wound BJY:[id number] tissue.  Drainage (amount, consistency, odor)dry  No drainage  Minimal serosanguinous  Musty odor Periwound:Erythema and tenderness Dressing procedure/placement/frequency: Cleanse wounds to left foot with NS.  Apply Aquacel Ag to each wound.  Cover with 4x4 gauze and kerlix/tape.  Change every other day. Please demonstrate and send supplies home with patient so they can follow up with wound care center.  Will not follow at this time.  Please re-consult if needed.  Domenic Moras RN BSN [place] Pager [phone]

## 2017-09-27 NOTE — Progress Notes (Signed)
  Echocardiogram 2D Echocardiogram has been performed.  PISTOL KESSENICH 09/27/2017, 3:49 PM

## 2017-09-27 NOTE — Progress Notes (Signed)
PHARMACY - PHYSICIAN COMMUNICATION CRITICAL VALUE ALERT - BLOOD CULTURE IDENTIFICATION (BCID)  [name] [name] is an 84 y.o. male who presented to [facility] on 09/26/2017 with a chief complaint of fatigue and lower back pain.  Assessment:  Started on broad-spectrum ABX for SIRS/sepsis with unclear source, now w/ MSSA in 2/2 bottles of blood cx.  Name of physician (or Provider) Contacted: J.Choi  Current antibiotics: Vanc and cefepime  Changes to prescribed antibiotics recommended:  Recommendations accepted by provider -- change to Ancef  Results for orders placed or performed during the hospital encounter of 09/26/17  Blood Culture ID Panel (Reflexed) (Collected: 09/26/2017  6:41 PM)  Result Value Ref Range   Enterococcus species NOT DETECTED NOT DETECTED   Listeria monocytogenes NOT DETECTED NOT DETECTED   Staphylococcus species DETECTED (A) NOT DETECTED   Staphylococcus aureus DETECTED (A) NOT DETECTED   Methicillin resistance NOT DETECTED NOT DETECTED   Streptococcus species NOT DETECTED NOT DETECTED   Streptococcus agalactiae NOT DETECTED NOT DETECTED   Streptococcus pneumoniae NOT DETECTED NOT DETECTED   Streptococcus pyogenes NOT DETECTED NOT DETECTED   Acinetobacter baumannii NOT DETECTED NOT DETECTED   Enterobacteriaceae species NOT DETECTED NOT DETECTED   Enterobacter cloacae complex NOT DETECTED NOT DETECTED   Escherichia coli NOT DETECTED NOT DETECTED   Klebsiella oxytoca NOT DETECTED NOT DETECTED   Klebsiella pneumoniae NOT DETECTED NOT DETECTED   Proteus species NOT DETECTED NOT DETECTED   Serratia marcescens NOT DETECTED NOT DETECTED   Haemophilus influenzae NOT DETECTED NOT DETECTED   Neisseria meningitidis NOT DETECTED NOT DETECTED   Pseudomonas aeruginosa NOT DETECTED NOT DETECTED   Candida albicans NOT DETECTED NOT DETECTED   Candida glabrata NOT DETECTED NOT DETECTED   Candida krusei NOT DETECTED NOT DETECTED   Candida parapsilosis NOT DETECTED NOT DETECTED   Candida tropicalis NOT DETECTED NOT DETECTED    Wynona Neat, PharmD, BCPS  09/27/2017  7:37 AM

## 2017-09-27 NOTE — Consult Note (Signed)
[facility] for Infectious Disease    Date of Admission:  09/26/2017     Total days of antibiotics 0               Reason for Consult: MSSA Bacteremia  Referring Provider: [place] Primary Care Provider: Claretta Fraise, MD   Assessment/Plan:  [name] [name] is a 84 y.o male with previous medical history of Alzheimer's, CAD, and diabetes who experienced a mechanical fall 3 days ago. Caregivers noted he was sleeping more and was brought to the ED where he was having fever and back pain. Blood cultures now positive for MSSA. Antimicrobial therapy changed to cefazolin. Urine cultures were negative. CT scan of the abdomen was negative with no fractures identified. Source of infection remains unclear. He does have a diabetic foot ulcer, although it does not appear infected. There is also concern for possible Osler's nodes on the right hand.   1. Agree with change to Cefazolin. 2. Repeat blood cultures in the morning to confirm clearance.  3. TTE to evaluate for endocarditis. TEE may be needed.    Principal Problem:   MSSA bacteremia Active Problems:   HTN (hypertension)   CAD (coronary artery disease)   Hypothyroidism   Late onset Alzheimer's disease without behavioral disturbance   SIRS (systemic inflammatory response syndrome) (HCC)   Diabetes mellitus type II, non insulin dependent (HCC)   Macrocytic anemia   Low back pain   Pressure injury of deep tissue of left foot   CKD (chronic kidney disease), stage III ([place])   . aspirin EC  81 mg Oral Daily  . [START ON 09/28/2017] B-complex with vitamin C  1 tablet Oral QODAY  . donepezil  10 mg Oral QHS  . famotidine  20 mg Oral Daily  . heparin  5,000 Units Subcutaneous Q8H  . insulin aspart  0-5 Units Subcutaneous QHS  . insulin aspart  0-9 Units Subcutaneous TID WC  . levothyroxine  50 mcg Oral QAC breakfast  . metoprolol succinate  100 mg Oral Daily  . multivitamin with minerals  1 tablet Oral Daily  . sodium chloride  flush  3 mL Intravenous Q12H     HPI: [name] [name] is a 84 y.o. male with previous medical history of CAD s/p CABG, Diabetic neuropathy, hypertension, and dementia who was admitted to the hospital on 7/1 following a mechanical fall 2 days prior to arrival resulting in back pain. Marland Kitchen He lives with 24 hour caregivers and was noted to be sleeping more over the previous few days.    A caregiver provided majority of the history secondary to Alzheimer's disease. She was unaware of any recent fevers, chills, cough, nausea or vomiting. Lab work showed WBC count of 23.3 and mild anemia of 11.1; creatinine of 1.82 and lactic acid of 4.95. UA obtained with no evidence of infection. Imagining of the chest with no acute process; X-ray of the foot was unremarkable and CT of the abdomen and pelvis with no acute abnormality. Vital signs with fever of 101.5. Blood cultures were obtained and were positive for MSSA. He was initially started on vancomycin and cefepime which has been changed to Ancef for MSSA.   Interval History:   Daughter is the primary source of information. At baseline [name] [name] is oriented to person only but does have the ability to do some activities of daily living with assistance. He ambulates with a walker at home. He was noted to be sleeping more over the course  of the last 3 weeks leading up to a fall from the bed that was unwitnessed. Family is unsure if he fell or slid out of bed. The back pain is new as he only has had some occasional pain in the past. There was no significant trauma noted. He generally has a good appetite but has been noted to be falling asleep during meals recently. The wound on his foot has been going on for several weeks and he has been seen by wound care with instructions for using an iodine cream. The blister on the lateral left foot and discoloration of the medial left foot is new.  Review of Systems: Review of Systems  Unable to perform ROS: Dementia     Past  Medical History:  Diagnosis Date  . CAD (coronary artery disease) 2002   Nuclear, January, 2011, no ischemia  . Carotid artery disease ([place])    Doppler, February, 2012, 0-39% bilateral  . Diabetic neuropathy ([place])    Possible  . Ejection fraction    EF 50-55%, echo, January, 2011, hypokinesis mid--base inferolateral  . HTN (hypertension)   . Hx of CABG    1998  . Hyperlipidemia   . Respiratory failure ([facility])    vent dependent...acquired pneumonia...2008  . Shortness of breath    2011    Social History   Tobacco Use  . Smoking status: Never Smoker  . Smokeless tobacco: Never Used  Substance Use Topics  . Alcohol use: No  . Drug use: No    Family History  Problem Relation Age of Onset  . Heart disease Father        mother  . Heart attack Father 64       deceased  . Heart failure Mother 97       deceased    No Known Allergies  OBJECTIVE: Blood pressure 130/70, pulse 77, temperature 98.3 F (36.8 C), resp. rate 17, height 5\' 9"  (1.753 m), weight 174 lb 2.6 oz (79 kg), SpO2 95 %.  Physical Exam  Constitutional: He appears well-developed and well-nourished. No distress.  Lying in bed  Cardiovascular: Normal rate, regular rhythm, normal heart sounds and intact distal pulses. Exam reveals no gallop and no friction rub.  No murmur heard. Pulmonary/Chest: Effort normal and breath sounds normal. No stridor. No respiratory distress. He has no wheezes. He has no rales.  Abdominal: Soft. Bowel sounds are normal.  Neurological: He is alert. He is disoriented (Baseline; oriented to person only).  Skin: Skin is warm and dry.  Quarter sized circular lesion with eschar within the center. No obvious redness, edema, drainage or odor.       Lab Results Lab Results  Component Value Date   WBC 18.9 (H) 09/27/2017   HGB 9.7 (L) 09/27/2017   HCT 29.8 (L) 09/27/2017   MCV 103.5 (H) 09/27/2017   PLT 177 09/27/2017    Lab Results  Component Value Date   CREATININE 1.69 (H)  09/27/2017   BUN 31 (H) 09/27/2017   NA 137 09/27/2017   K 4.2 09/27/2017   CL 106 09/27/2017   CO2 17 (L) 09/27/2017    Lab Results  Component Value Date   ALT 23 09/27/2017   AST 29 09/27/2017   ALKPHOS 55 09/27/2017   BILITOT 1.4 (H) 09/27/2017     Microbiology: Recent Results (from the past 240 hour(s))  Blood Culture (routine x 2)     Status: None (Preliminary result)   Collection Time: 09/26/17  6:41 PM  Result Value Ref Range Status   Specimen Description BLOOD RIGHT ANTECUBITAL  Final   Special Requests   Final    BOTTLES DRAWN AEROBIC AND ANAEROBIC Blood Culture results may not be optimal due to an excessive volume of blood received in culture bottles Performed at [facility] [address], [place], [place] [address]    Culture  Setup Time   Final    IN BOTH AEROBIC AND ANAEROBIC BOTTLES GRAM POSITIVE COCCI Organism ID to follow CRITICAL RESULT CALLED TO, READ BACK BY AND VERIFIED WITH: C BENDER PHARMD 09/27/17 [id number] JDW    Culture GRAM POSITIVE COCCI  Final   Report Status PENDING  Incomplete  Blood Culture ID Panel (Reflexed)     Status: Abnormal   Collection Time: 09/26/17  6:41 PM  Result Value Ref Range Status   Enterococcus species NOT DETECTED NOT DETECTED Final   Listeria monocytogenes NOT DETECTED NOT DETECTED Final   Staphylococcus species DETECTED (A) NOT DETECTED Final    Comment: CRITICAL RESULT CALLED TO, READ BACK BY AND VERIFIED WITH: C BENDER PHARMD 09/27/17 0721 JDW    Staphylococcus aureus DETECTED (A) NOT DETECTED Final    Comment: Methicillin (oxacillin) susceptible Staphylococcus aureus (MSSA). Preferred therapy is anti staphylococcal beta lactam antibiotic (Cefazolin or Nafcillin), unless clinically contraindicated. CRITICAL RESULT CALLED TO, READ BACK BY AND VERIFIED WITH: C BENDER PHARMD 09/27/17 0721 JDW    Methicillin resistance NOT DETECTED NOT DETECTED Final   Streptococcus species NOT DETECTED NOT DETECTED Final   Streptococcus  agalactiae NOT DETECTED NOT DETECTED Final   Streptococcus pneumoniae NOT DETECTED NOT DETECTED Final   Streptococcus pyogenes NOT DETECTED NOT DETECTED Final   Acinetobacter baumannii NOT DETECTED NOT DETECTED Final   Enterobacteriaceae species NOT DETECTED NOT DETECTED Final   Enterobacter cloacae complex NOT DETECTED NOT DETECTED Final   Escherichia coli NOT DETECTED NOT DETECTED Final   Klebsiella oxytoca NOT DETECTED NOT DETECTED Final   Klebsiella pneumoniae NOT DETECTED NOT DETECTED Final   Proteus species NOT DETECTED NOT DETECTED Final   Serratia marcescens NOT DETECTED NOT DETECTED Final   Haemophilus influenzae NOT DETECTED NOT DETECTED Final   Neisseria meningitidis NOT DETECTED NOT DETECTED Final   Pseudomonas aeruginosa NOT DETECTED NOT DETECTED Final   Candida albicans NOT DETECTED NOT DETECTED Final   Candida glabrata NOT DETECTED NOT DETECTED Final   Candida krusei NOT DETECTED NOT DETECTED Final   Candida parapsilosis NOT DETECTED NOT DETECTED Final   Candida tropicalis NOT DETECTED NOT DETECTED Final     Terri Piedra, NP [facility] [phone] Pager  09/27/2017  9:36 AM

## 2017-09-27 NOTE — Evaluation (Signed)
Physical Therapy Evaluation Patient Details Name: [name] [name] Peter MRN: [id number] DOB: Feb 12, 1919 Today's Date: 09/27/2017   History of Present Illness  Pt is a 84 y.o. male presenting with fatigue and low back pain. Pt also noted to have a fall at home a few days prior to admission. PMHx: Dementia, HTN, Hypothyroidism, CAD, DM2.  Clinical Impression  Orders received for PT evaluation. Patient demonstrates deficits in functional mobility as indicated below. Will benefit from continued skilled PT to address deficits and maximize function. Will see as indicated and progress as tolerated.  Given current ability and history of multiple falls feel patient will need SNF upon acute discharge.    Follow Up Recommendations SNF    Equipment Recommendations  (TBD)    Recommendations for Other Services       Precautions / Restrictions Precautions Precautions: Fall Restrictions Weight Bearing Restrictions: No      Mobility  Bed Mobility Overal bed mobility: Needs Assistance Bed Mobility: Rolling;Supine to Sit Rolling: Min assist   Supine to sit: Min assist     General bed mobility comments: Min assist to roll and elevate trunk to upright at EOB, increased time and effort to perform.   Transfers Overall transfer level: Needs assistance Equipment used: Rolling walker (2 wheeled) Transfers: Sit to/from [facility] Sit to Stand: Mod assist Stand pivot transfers: Mod assist       General transfer comment: Mod assist to power up to standing using RW, continued assist for stability to pivot to chair  Ambulation/Gait             General Gait Details: unable to attempt today  Stairs            Wheelchair Mobility    Modified Rankin (Stroke Patients Only)       Balance Overall balance assessment: Needs assistance Sitting-balance support: Feet supported Sitting balance-Leahy Scale: Fair     Standing balance support: Bilateral upper extremity  supported Standing balance-Leahy Scale: Poor Standing balance comment: RW for support                             Pertinent Vitals/Pain Pain Assessment: Faces Faces Pain Scale: Hurts little more Pain Location: L flank and back pain Pain Descriptors / Indicators: Discomfort Pain Intervention(s): Limited activity within patient's tolerance;Monitored during session    Home Living Family/patient expects to be discharged to:: Private residence Living Arrangements: Spouse/significant other Available Help at Discharge: Family;Personal care attendant;Available 24 hours/day Type of Home: House Home Access: Stairs to enter Entrance Stairs-Rails: Can reach both Entrance Stairs-Number of Steps: 5 Home Layout: One level Home Equipment: Walker - 2 wheels;Cane - single point Additional Comments: Wife with dementia, caregivers only assisting during the day    Prior Function Level of Independence: Needs assistance   Gait / Transfers Assistance Needed: has been using a walker in the home  ADL's / Homemaking Assistance Needed: caregiver assisting with bathing/dressing recently. Prior to ~2 weeks ago, pt able to perform most of BADL without physical assist  Comments: has caregiver that comes in every day for 10 hours     Hand Dominance   Dominant Hand: Right    Extremity/Trunk Assessment   Upper Extremity Assessment Upper Extremity Assessment: Generalized weakness    Lower Extremity Assessment Lower Extremity Assessment: Generalized weakness    Cervical / Trunk Assessment Cervical / Trunk Assessment: Kyphotic  Communication   Communication: HOH  Cognition Arousal/Alertness: Awake/alert Behavior During Therapy:  Flat affect Overall Cognitive Status: History of cognitive impairments - at baseline                                 General Comments: Per daughter, pt at his cognitive baseline      General Comments      Exercises     Assessment/Plan     PT Assessment Patient needs continued PT services  PT Problem List Decreased strength;Decreased activity tolerance;Decreased balance;Decreased mobility;Decreased safety awareness       PT Treatment Interventions DME instruction;Gait training;Therapeutic activities;Functional mobility training;Therapeutic exercise;Stair training;Balance training;Cognitive remediation;Patient/family education    PT Goals (Current goals can be found in the Care Plan section)  Acute Rehab PT Goals Patient Stated Goal: per daughter; pt to rehab PT Goal Formulation: With patient/family Time For Goal Achievement: 10/11/17 Potential to Achieve Goals: Good    Frequency Min 2X/week   Barriers to discharge Decreased caregiver support      Co-evaluation               AM-PAC PT "6 Clicks" Daily Activity  Outcome Measure Difficulty turning over in bed (including adjusting bedclothes, sheets and blankets)?: Unable Difficulty moving from lying on back to sitting on the side of the bed? : Unable Difficulty sitting down on and standing up from a chair with arms (e.g., wheelchair, bedside commode, etc,.)?: Unable Help needed moving to and from a bed to chair (including a wheelchair)?: A Little Help needed walking in hospital room?: A Little Help needed climbing 3-5 steps with a railing? : A Little 6 Click Score: 12    End of Session Equipment Utilized During Treatment: Gait belt Activity Tolerance: Patient limited by fatigue Patient left: in chair;with call bell/phone within reach;with chair alarm set Nurse Communication: Mobility status PT Visit Diagnosis: Muscle weakness (generalized) (M62.81);History of falling (Z91.81)    Time: 3500-9381 PT Time Calculation (min) (ACUTE ONLY): 20 min   Charges:   PT Evaluation $PT Eval Moderate Complexity: 1 Mod     PT G Codes:        Alben Deeds, PT DPT  Board Certified Neurologic Specialist [facility] 09/27/2017, 4:49 PM

## 2017-09-27 NOTE — Progress Notes (Signed)
RN notified Pt lactic is now 6.4, Paged Dr. Myna Hidalgo and new orders placed for 250 cc bolus NS. BP 130/62  and HR 80. Will continue to monitor.

## 2017-09-28 DIAGNOSIS — N179 Acute kidney failure, unspecified: Secondary | ICD-10-CM

## 2017-09-28 DIAGNOSIS — E860 Dehydration: Secondary | ICD-10-CM

## 2017-09-28 LAB — CBC
HCT: 29.6 % — ABNORMAL LOW (ref 39.0–52.0)
HEMOGLOBIN: 9.6 g/dL — AB (ref 13.0–17.0)
MCH: 33.7 pg (ref 26.0–34.0)
MCHC: 32.4 g/dL (ref 30.0–36.0)
MCV: 103.9 fL — AB (ref 78.0–100.0)
PLATELETS: 180 10*3/uL (ref 150–400)
RBC: 2.85 MIL/uL — AB (ref 4.22–5.81)
RDW: 13.7 % (ref 11.5–15.5)
WBC: 16.2 10*3/uL — AB (ref 4.0–10.5)

## 2017-09-28 LAB — BASIC METABOLIC PANEL
Anion gap: 8 (ref 5–15)
BUN: 37 mg/dL — ABNORMAL HIGH (ref 8–23)
CO2: 21 mmol/L — ABNORMAL LOW (ref 22–32)
Calcium: 7.7 mg/dL — ABNORMAL LOW (ref 8.9–10.3)
Chloride: 106 mmol/L (ref 98–111)
Creatinine, Ser: 1.44 mg/dL — ABNORMAL HIGH (ref 0.61–1.24)
GFR calc Af Amer: 46 mL/min — ABNORMAL LOW (ref >60)
GFR calc non Af Amer: 39 mL/min — ABNORMAL LOW (ref >60)
Glucose, Bld: 145 mg/dL — ABNORMAL HIGH (ref 70–99)
Potassium: 3.9 mmol/L (ref 3.5–5.1)
Sodium: 135 mmol/L (ref 135–145)

## 2017-09-28 LAB — GLUCOSE, CAPILLARY
Glucose-Capillary: 129 mg/dL — ABNORMAL HIGH (ref 70–99)
Glucose-Capillary: 177 mg/dL — ABNORMAL HIGH (ref 70–99)
Glucose-Capillary: 167 mg/dL — ABNORMAL HIGH (ref 70–99)
Glucose-Capillary: 127 mg/dL — ABNORMAL HIGH (ref 70–99)

## 2017-09-28 MED ORDER — HYDRALAZINE HCL 20 MG/ML IJ SOLN: 10.0000 mg | Freq: Four times a day (QID) | INTRAMUSCULAR | Status: DC | PRN

## 2017-09-28 NOTE — Clinical Social Work Note (Signed)
Clinical Social Work Assessment  Patient Details  Name: [name] [name] MRN: [id number] Date of Birth: February 19, 1919  Date of referral:  09/28/17               Reason for consult:  Facility Placement                Permission sought to share information with:  Facility Sport and exercise psychologist, Family Supports Permission granted to share information::  No  Name::     Engineer, site::  SNFs  Relationship::  Daughter  Contact Information:  [phone]  Housing/Transportation Living arrangements for the past 2 months:  Single Family Home Source of Information:  Adult Children Patient Interpreter Needed:  None Criminal Activity/Legal Involvement Pertinent to Current Situation/Hospitalization:  No - Comment as needed Significant Relationships:  Adult Children, Spouse Lives with:  Spouse Do you feel safe going back to the place where you live?  No Need for family participation in patient care:  Yes (Comment)  Care giving concerns:  CSW received consult for possible SNF placement at time of discharge. CSW spoke with patient's daughter and son in law regarding PT recommendation of SNF placement at time of discharge. Patient's daughter reported that patient's spouse also has dementia and they just hired a 24 hour caregiver before the patient's fall. Patient's daughter expressed understanding of PT recommendation and is agreeable to SNF placement at time of discharge. CSW to continue to follow and assist with discharge planning needs.   Social Worker assessment / plan:  CSW spoke with patient's daughter concerning possibility of rehab at [facility] before returning home.  Employment status:  Retired Forensic scientist:  Medicare PT Recommendations:  [place] / Referral to community resources:  [place]  Patient/Family's Response to care:  Patient's daughter recognizes need for rehab before returning home and is agreeable to a SNF in [place].  Patient's daughter reported preference for [facility]. She also expressed interest in the possibility of long term placement but isn't sure at this time. CSW explained process. Patient's daughter states she is worried about her mother being at home without her father. CSW provided supportive listening.  Patient/Family's Understanding of and Emotional Response to Diagnosis, Current Treatment, and Prognosis:  Patient/family is realistic regarding therapy needs and expressed being hopeful for SNF placement. Patient's daughter expressed understanding of CSW role and discharge process as well as medical condition. No questions/concerns about plan or treatment.    Emotional Assessment Appearance:  Appears stated age Attitude/Demeanor/Rapport:  Unable to Assess Affect (typically observed):  Unable to Assess Orientation:  Oriented to Self Alcohol / Substance use:  Not Applicable Psych involvement (Current and /or in the community):  No (Comment)  Discharge Needs  Concerns to be addressed:  Care Coordination Readmission within the last 30 days:  No Current discharge risk:  None Barriers to Discharge:  Continued Medical Work up   [employer], [place] 09/28/2017, 11:25 AM

## 2017-09-28 NOTE — Progress Notes (Signed)
[facility]  Date of Admission:  09/26/2017     Total days of antibiotics 1      ASSESSMENT/PLAN  Mr. [name] [name] is a 84 y/o male who experienced a mechanical fall and was brought to the hospital with back pain and fatigue/lethargy. Found to have MSSA bacteremia.   Repeat cultures drawn this morning and are pending. Source remains unclear. TTE had no evidence of vegetation. Endocarditis and osteomyelitis of the foot and potential for discitis remain possible sources. Discussed additional testing including MRI and TEE that would provide information regarding infectious source and duration of treatment. Daughter wishes to hold off at this time. Also discussed length of treatment may need to be extended for as long as these conditions are of concern. It appears as though disposition will be to skilled nursing.      1. Continue current dosage of Ancef and monitor cultures.    Principal Problem:   MSSA bacteremia Active Problems:   HTN (hypertension)   CAD (coronary artery disease)   Hypothyroidism   Late onset Alzheimer's disease without behavioral disturbance   SIRS (systemic inflammatory response syndrome) (HCC)   Diabetes mellitus type II, non insulin dependent (HCC)   Macrocytic anemia   Low back pain   Pressure injury of deep tissue of left foot   CKD (chronic kidney disease), stage III ([place])   . aspirin EC  81 mg Oral Daily  . B-complex with vitamin C  1 tablet Oral QODAY  . donepezil  10 mg Oral QHS  . famotidine  20 mg Oral Daily  . heparin  5,000 Units Subcutaneous Q8H  . insulin aspart  0-5 Units Subcutaneous QHS  . insulin aspart  0-9 Units Subcutaneous TID WC  . levothyroxine  50 mcg Oral QAC breakfast  . metoprolol succinate  50 mg Oral Daily  . multivitamin with minerals  1 tablet Oral Daily  . sodium chloride flush  3 mL Intravenous Q12H    SUBJECTIVE:   Afebrile overnight with WBC at 16.2. Urine culture with no evidence of infection.  Repeat cultures pending.    *No Known Allergies   Review of Systems: Review of Systems  Unable to perform ROS: Dementia      OBJECTIVE: Vitals:   09/27/17 2132 09/28/17 0616 09/28/17 0623 09/28/17 0947  BP: (!) 147/66 (!) 158/86 (!) 158/86 (!) 144/57  Pulse: 70 60 60 87  Resp: 20 19 19    Temp: 99.4 F (37.4 C)  98.1 F (36.7 C)   TempSrc: Oral  Oral   SpO2: 90% 93% 93%   Weight:   181 lb (82.1 kg)   Height:       Body mass index is 26.73 kg/m.  Physical Exam  Constitutional: He appears well-developed and well-nourished. No distress.  Lying in bed. Sitter next to bed.   Cardiovascular: Normal rate, regular rhythm, normal heart sounds and intact distal pulses. Exam reveals no gallop and no friction rub.  No murmur heard. Pulmonary/Chest: Effort normal and breath sounds normal. No stridor. No respiratory distress. He has no wheezes. He has no rales. He exhibits no tenderness.  Abdominal: Soft. Bowel sounds are normal. He exhibits no distension. There is no tenderness.  Musculoskeletal:  Foot bandaged which appears clean, dry and intact. No evidence of discharge, edema or redness around the site.   Skin: Skin is warm and dry.    Lab Results Lab Results  Component Value Date   WBC 16.2 (H) 09/28/2017  HGB 9.6 (L) 09/28/2017   HCT 29.6 (L) 09/28/2017   MCV 103.9 (H) 09/28/2017   PLT 180 09/28/2017    Lab Results  Component Value Date   CREATININE 1.44 (H) 09/28/2017   BUN 37 (H) 09/28/2017   NA 135 09/28/2017   K 3.9 09/28/2017   CL 106 09/28/2017   CO2 21 (L) 09/28/2017    Lab Results  Component Value Date   ALT 23 09/27/2017   AST 29 09/27/2017   ALKPHOS 55 09/27/2017   BILITOT 1.4 (H) 09/27/2017     Microbiology: Recent Results (from the past 240 hour(s))  Urine culture     Status: Abnormal   Collection Time: 09/26/17  5:32 PM  Result Value Ref Range Status   Specimen Description URINE, CLEAN CATCH  Final   Special Requests NONE  Final    Culture (A)  Final    <10,000 COLONIES/mL INSIGNIFICANT GROWTH Performed at [facility], [address], [place], [name] [address]    Report Status 09/27/2017 FINAL  Final  Blood Culture (routine x 2)     Status: Abnormal (Preliminary result)   Collection Time: 09/26/17  6:41 PM  Result Value Ref Range Status   Specimen Description BLOOD RIGHT ANTECUBITAL  Final   Special Requests   Final    BOTTLES DRAWN AEROBIC AND ANAEROBIC Blood Culture results may not be optimal due to an excessive volume of blood received in culture bottles   Culture  Setup Time   Final    IN BOTH AEROBIC AND ANAEROBIC BOTTLES GRAM POSITIVE COCCI CRITICAL RESULT CALLED TO, READ BACK BY AND VERIFIED WITH: C BENDER PHARMD 09/27/17 0721 JDW    Culture (A)  Final    STAPHYLOCOCCUS AUREUS SUSCEPTIBILITIES TO FOLLOW Performed at [facility], [place] [address] [place], [place] [address]    Report Status PENDING  Incomplete  Blood Culture ID Panel (Reflexed)     Status: Abnormal   Collection Time: 09/26/17  6:41 PM  Result Value Ref Range Status   Enterococcus species NOT DETECTED NOT DETECTED Final   Listeria monocytogenes NOT DETECTED NOT DETECTED Final   Staphylococcus species DETECTED (A) NOT DETECTED Final    Comment: CRITICAL RESULT CALLED TO, READ BACK BY AND VERIFIED WITH: C BENDER PHARMD 09/27/17 0721 JDW    Staphylococcus aureus DETECTED (A) NOT DETECTED Final    Comment: Methicillin (oxacillin) susceptible Staphylococcus aureus (MSSA). Preferred therapy is anti staphylococcal beta lactam antibiotic (Cefazolin or Nafcillin), unless clinically contraindicated. CRITICAL RESULT CALLED TO, READ BACK BY AND VERIFIED WITH: C BENDER PHARMD 09/27/17 0721 JDW    Methicillin resistance NOT DETECTED NOT DETECTED Final   Streptococcus species NOT DETECTED NOT DETECTED Final   Streptococcus agalactiae NOT DETECTED NOT DETECTED Final   Streptococcus pneumoniae NOT DETECTED NOT DETECTED Final   Streptococcus  pyogenes NOT DETECTED NOT DETECTED Final   Acinetobacter baumannii NOT DETECTED NOT DETECTED Final   Enterobacteriaceae species NOT DETECTED NOT DETECTED Final   Enterobacter cloacae complex NOT DETECTED NOT DETECTED Final   Escherichia coli NOT DETECTED NOT DETECTED Final   Klebsiella oxytoca NOT DETECTED NOT DETECTED Final   Klebsiella pneumoniae NOT DETECTED NOT DETECTED Final   Proteus species NOT DETECTED NOT DETECTED Final   Serratia marcescens NOT DETECTED NOT DETECTED Final   Haemophilus influenzae NOT DETECTED NOT DETECTED Final   Neisseria meningitidis NOT DETECTED NOT DETECTED Final   Pseudomonas aeruginosa NOT DETECTED NOT DETECTED Final   Candida albicans NOT DETECTED NOT  DETECTED Final   Candida glabrata NOT DETECTED NOT DETECTED Final   Candida krusei NOT DETECTED NOT DETECTED Final   Candida parapsilosis NOT DETECTED NOT DETECTED Final   Candida tropicalis NOT DETECTED NOT DETECTED Final     Terri Piedra, NP [facility] [phone] Pager  09/28/2017  10:41 AM

## 2017-09-28 NOTE — NC FL2 (Addendum)
[place] LEVEL OF CARE SCREENING TOOL     IDENTIFICATION  Patient Name: [name] [name] Birthdate: 09-17-1918 Sex: male Admission Date (Current Location): 09/26/2017  [facility] [place] Number:  Herbalist and Address:  [facility], [place] [address] [place], [place] [address]      Provider Number:    Attending Physician Name and Address:  Mendel Corning, MD  Relative Name and Phone Number:  Karna Christmas, daughter [phone]    Current Level of Care: Hospital Recommended Level of Care: [place] Prior Approval Number:    Date Approved/Denied:   PASRR Number: [id number]  Discharge Plan: SNF    Current Diagnoses: Patient Active Problem List   Diagnosis Date Noted  . MSSA bacteremia 09/27/2017  . SIRS (systemic inflammatory response syndrome) ([place]) 09/26/2017  . Diabetes mellitus type II, non insulin dependent ([place]) 09/26/2017  . Macrocytic anemia 09/26/2017  . Low back pain 09/26/2017  . Pressure injury of deep tissue of left foot 09/26/2017  . CKD (chronic kidney disease), stage III ([place]) 09/26/2017  . Vitamin B12 deficiency 04/16/2016  . Late onset Alzheimer's disease without behavioral disturbance 01/14/2016  . Hypothyroidism 07/26/2014  . HTN (hypertension)   . Hyperlipidemia   . CAD (coronary artery disease)   . Diabetic neuropathy ([place])   . Carotid artery disease (HCC)     Orientation RESPIRATION BLADDER Height & Weight     Self  Normal Continent Weight: 181 lb (82.1 kg) Height:  5\' 9"  (175.3 cm)  BEHAVIORAL SYMPTOMS/MOOD NEUROLOGICAL BOWEL NUTRITION STATUS  Dangerous to self, others or property   Continent Diet(See discharge summary)  AMBULATORY STATUS COMMUNICATION OF NEEDS Skin   Extensive Assist Verbally PU Stage and Appropriate Care(unstageable on toe and foot)                       Personal Care Assistance Level of Assistance  Bathing, Feeding, Dressing Bathing Assistance: Maximum  assistance Feeding assistance: Maximum assistance Dressing Assistance: Maximum assistance     Functional Limitations Info  Sight, Hearing, Speech Sight Info: Adequate Hearing Info: Adequate Speech Info: Adequate    SPECIAL CARE FACTORS FREQUENCY  PT (By licensed PT), OT (By licensed OT)     PT Frequency: 5x/wk OT Frequency: 3x/wk            Contractures Contractures Info: Not present    Additional Factors Info  Code Status, Allergies, Insulin Sliding Scale Code Status Info: DNR Allergies Info: NKA   Insulin Sliding Scale Info: 3x daily with meals and at bedtime       Current Medications (09/28/2017):  This is the current hospital active medication list Current Facility-Administered Medications  Medication Dose Route Frequency Provider Last Rate Last Dose  . 0.9 %  sodium chloride infusion   Intravenous Continuous Dessa Phi, DO 100 mL/hr at 09/28/17 1308    . acetaminophen (TYLENOL) tablet 650 mg  650 mg Oral Q6H PRN Opyd, Ilene Qua, MD       Or  . acetaminophen (TYLENOL) suppository 650 mg  650 mg Rectal Q6H PRN Opyd, Ilene Qua, MD      . aspirin EC tablet 81 mg  81 mg Oral Daily Opyd, Ilene Qua, MD   81 mg at 09/28/17 0948  . B-complex with vitamin C tablet 1 tablet  1 tablet Oral QODAY Vianne Bulls, MD   1 tablet at 09/28/17 0947  . bisacodyl (DULCOLAX) EC tablet 5 mg  5 mg Oral Daily PRN Opyd, Ilene Qua, MD      . ceFAZolin (ANCEF) IVPB 2g/100 mL premix  2 g Intravenous Q12H Laren Everts, RPH 200 mL/hr at 09/28/17 0941 2 g at 09/28/17 0941  . donepezil (ARICEPT) tablet 10 mg  10 mg Oral QHS Opyd, Ilene Qua, MD   10 mg at 09/27/17 2148  . famotidine (PEPCID) tablet 20 mg  20 mg Oral Daily Opyd, Ilene Qua, MD   20 mg at 09/28/17 0948  . fentaNYL (SUBLIMAZE) injection 12.5-25 mcg  12.5-25 mcg Intravenous Q2H PRN Opyd, Ilene Qua, MD      . heparin injection 5,000 Units  5,000 Units Subcutaneous Q8H Opyd, Ilene Qua, MD   5,000 Units at 09/28/17 0602  .  HYDROcodone-acetaminophen (NORCO/VICODIN) 5-325 MG per tablet 1 tablet  1 tablet Oral Q4H PRN Opyd, Ilene Qua, MD      . insulin aspart (novoLOG) injection 0-5 Units  0-5 Units Subcutaneous QHS Opyd, Ilene Qua, MD   2 Units at 09/26/17 2237  . insulin aspart (novoLOG) injection 0-9 Units  0-9 Units Subcutaneous TID WC Opyd, Ilene Qua, MD   1 Units at 09/28/17 0940  . levothyroxine (SYNTHROID, LEVOTHROID) tablet 50 mcg  50 mcg Oral QAC breakfast Opyd, Ilene Qua, MD   50 mcg at 09/28/17 0947  . metoprolol succinate (TOPROL-XL) 24 hr tablet 50 mg  50 mg Oral Daily Dessa Phi, DO   50 mg at 09/28/17 0948  . multivitamin with minerals tablet 1 tablet  1 tablet Oral Daily Opyd, Ilene Qua, MD   1 tablet at 09/28/17 0947  . ondansetron (ZOFRAN) tablet 4 mg  4 mg Oral Q6H PRN Opyd, Ilene Qua, MD       Or  . ondansetron (ZOFRAN) injection 4 mg  4 mg Intravenous Q6H PRN Opyd, Ilene Qua, MD      . senna-docusate (Senokot-S) tablet 1 tablet  1 tablet Oral QHS PRN Opyd, Ilene Qua, MD      . sodium chloride flush (NS) 0.9 % injection 3 mL  3 mL Intravenous Q12H Opyd, Ilene Qua, MD   3 mL at 09/27/17 4174     Discharge Medications: Please see discharge summary for a list of discharge medications.  Relevant Imaging Results:  Relevant Lab Results:   Additional Information SSN [id number]    Needs 6 weeks IV antibiotics  Vinie Sill, LCSWA

## 2017-09-28 NOTE — Progress Notes (Signed)
[facility]                                                                              Patient Demographics  [name] [name], is a 84 y.o. male, DOB - 1918/12/02, DDU:[id number]  Admit date - 09/26/2017   Admitting Physician Vianne Bulls, MD  Outpatient Primary MD for the patient is Claretta Fraise, MD  Outpatient specialists:   LOS - 2  days   Medical records reviewed and are as summarized below:    Chief Complaint  Patient presents with  . Fall  . Skin Ulcer       Brief summary   [name] [name] [name] a 84 y.o.malewith medical history significant fordementia, hypertension, hypothyroidism, coronary artery disease, and type 2 diabetes mellitus, now presenting to the emergency department for evaluation of fatigue and low back pain. Patient lives at home with his wife who also has dementia. They have caregivers come up about 8 hours a day for assistance. He was noted to be sleeping most of the day for the past couple days. He had suffered a ground-level mechanical fall a few days ago and had been complaining of low back pain since then. He has not been complaining of abdominal pain and there has not been any diarrhea or vomiting reported.  Patient was admitted for further work-up.  Assessment & Plan    Principal Problem: Severe sepsis secondary to MSSA bacteremia, nonhealing neuropathic ulcers to the left foot -Unclear source, possibly due to left foot, UA negative for UTI. -2D echo showed EF of 25 to 30% but no obvious vegetations -Infectious disease was consulted, recommended continue IV Ancef -Patient has been more lethargic, complaining of back pain as well in the last few weeks. -Discussed in detail with the patient's daughter and other family members at the bedside regarding further management aggressive approach including TEE, MRI of the lumbar spine, MRI of the left foot to rule out osteomyelitis/discitis.  Daughter does not want to pursue TEE however wants  to think about MRI of the lumbar spine and left foot   Active problems Acute encephalopathy superimposed on dementia -Continue Aricept, -Family requested skilled nursing facility at the time of discharge, social work consulted  Diabetes mellitus type 2, uncontrolled with hyperglycemia, NIDDM -Continue to hold Amaryl -Placed on sliding scale insulin  Essential hypertension -Continue Toprol-XL - will place on hydralazine as needed with parameters  History of coronary artery disease -Continue aspirin, Toprol-XL -2D echo showed EF of 25 to 30% with grade 1 diastolic dysfunction, diffuse hypokinesis -Not a candidate for invasive interventions at age, advanced dementia, continue medical management  Hypothyroidism -Continue Synthroid  Mild acute on chronic kidney disease stage III with lactic acidosis -Baseline creatinine 1.2-1.5 -Admitted with creatinine function of 1.8, improving, 1.4  Code Status: DNR DVT Prophylaxis: Heparin subcu Family Communication: Discussed in detail with the patient, all imaging results, lab results explained to the patient    Disposition Plan:   Time Spent in minutes 45 minutes,   Procedures:  None  Consultants:   Infectious disease   Antimicrobials:      Medications  Scheduled Meds: . aspirin EC  81 mg Oral Daily  . B-complex with vitamin C  1 tablet Oral QODAY  . donepezil  10 mg Oral QHS  . famotidine  20 mg Oral Daily  . heparin  5,000 Units Subcutaneous Q8H  . insulin aspart  0-5 Units Subcutaneous QHS  . insulin aspart  0-9 Units Subcutaneous TID WC  . levothyroxine  50 mcg Oral QAC breakfast  . metoprolol succinate  50 mg Oral Daily  . multivitamin with minerals  1 tablet Oral Daily  . sodium chloride flush  3 mL Intravenous Q12H   Continuous Infusions: . sodium chloride 100 mL/hr at 09/28/17 0653  .  ceFAZolin (ANCEF) IV 2 g (09/28/17 0941)   PRN Meds:.acetaminophen **OR** acetaminophen, bisacodyl, fentaNYL  (SUBLIMAZE) injection, HYDROcodone-acetaminophen, ondansetron **OR** ondansetron (ZOFRAN) IV, senna-docusate   Antibiotics   Anti-infectives (From admission, onward)   Start     Dose/Rate Route Frequency Ordered Stop   09/27/17 1200  vancomycin (VANCOCIN) IVPB 750 mg/150 ml premix  Status:  Discontinued     750 mg 150 mL/hr over 60 Minutes Intravenous Every 24 hours 09/26/17 2054 09/27/17 0735   09/27/17 1000  ceFAZolin (ANCEF) IVPB 2g/100 mL premix     2 g 200 mL/hr over 30 Minutes Intravenous Every 12 hours 09/27/17 0737     09/27/17 0000  ceFEPIme (MAXIPIME) 1 g in sodium chloride 0.9 % 100 mL IVPB  Status:  Discontinued     1 g 200 mL/hr over 30 Minutes Intravenous Every 24 hours 09/26/17 2054 09/27/17 0735   09/26/17 1815  vancomycin (VANCOCIN) IVPB 1000 mg/200 mL premix     1,000 mg 200 mL/hr over 60 Minutes Intravenous  Once 09/26/17 1813 09/26/17 2051   09/26/17 1815  piperacillin-tazobactam (ZOSYN) IVPB 3.375 g     3.375 g 100 mL/hr over 30 Minutes Intravenous  Once 09/26/17 1813 09/26/17 1934        Subjective:   [name] [name] was seen and examined today.  Alert and awake, has dementia, unable to obtain review of system from the patient.  No fevers or chills.  No acute issues overnight.  Per the sitter in the room, slept okay.    Objective:   Vitals:   09/28/17 0616 09/28/17 0623 09/28/17 0947 09/28/17 1230  BP: (!) 158/86 (!) 158/86 (!) 144/57 (!) 156/74  Pulse: 60 60 87 73  Resp: 19 19  (!) 28  Temp:  98.1 F (36.7 C)  97.9 F (36.6 C)  TempSrc:  Oral  Oral  SpO2: 93% 93%  94%  Weight:  82.1 kg (181 lb)    Height:        Intake/Output Summary (Last 24 hours) at 09/28/2017 1259 Last data filed at 09/27/2017 1500 Gross per 24 hour  Intake 670.82 ml  Output -  Net 670.82 ml     Wt Readings from Last 3 Encounters:  09/28/17 82.1 kg (181 lb)  09/13/17 78.6 kg (173 lb 4 oz)  05/03/17 81.6 kg (180 lb)     Exam  General: Alert and awake  Eyes: PERRLA,  EOMI, Anicteric Sclera,  HEENT:  Atraumatic, normocephalic, normal oropharynx  Cardiovascular: S1 S2 auscultated, no rubs, murmurs or gallops. Regular rate and rhythm.  Respiratory: Clear to auscultation bilaterally, no wheezing, rales or rhonchi  Gastrointestinal: Soft, nontender, nondistended, + bowel sounds  Ext: no pedal edema bilaterally  Neuro: difficult to assess, no new extremities obvious  Musculoskeletal: No digital cyanosis, clubbing  Skin: Left foot great toe ulcer, left lateral foot  below fifth metatarsal ulcer, dry,  Psych: dementia   Data Reviewed:  I have personally reviewed following labs and imaging studies  Micro Results Recent Results (from the past 240 hour(s))  Urine culture     Status: Abnormal   Collection Time: 09/26/17  5:32 PM  Result Value Ref Range Status   Specimen Description URINE, CLEAN CATCH  Final   Special Requests NONE  Final   Culture (A)  Final    <10,000 COLONIES/mL INSIGNIFICANT GROWTH Performed at [facility], [address], [place], [place] [address]    Report Status 09/27/2017 FINAL  Final  Blood Culture (routine x 2)     Status: Abnormal (Preliminary result)   Collection Time: 09/26/17  6:41 PM  Result Value Ref Range Status   Specimen Description BLOOD RIGHT ANTECUBITAL  Final   Special Requests   Final    BOTTLES DRAWN AEROBIC AND ANAEROBIC Blood Culture results may not be optimal due to an excessive volume of blood received in culture bottles   Culture  Setup Time   Final    IN BOTH AEROBIC AND ANAEROBIC BOTTLES GRAM POSITIVE COCCI CRITICAL RESULT CALLED TO, READ BACK BY AND VERIFIED WITH: C BENDER PHARMD 09/27/17 0721 JDW    Culture (A)  Final    STAPHYLOCOCCUS AUREUS SUSCEPTIBILITIES TO FOLLOW Performed at [facility], [place] [address], [place],  [address]    Report Status PENDING  Incomplete  Blood Culture ID Panel (Reflexed)     Status: Abnormal   Collection Time: 09/26/17  6:41 PM  Result Value  Ref Range Status   Enterococcus species NOT DETECTED NOT DETECTED Final   Listeria monocytogenes NOT DETECTED NOT DETECTED Final   Staphylococcus species DETECTED (A) NOT DETECTED Final    Comment: CRITICAL RESULT CALLED TO, READ BACK BY AND VERIFIED WITH: C BENDER PHARMD 09/27/17 0721 JDW    Staphylococcus aureus DETECTED (A) NOT DETECTED Final    Comment: Methicillin (oxacillin) susceptible Staphylococcus aureus (MSSA). Preferred therapy is anti staphylococcal beta lactam antibiotic (Cefazolin or Nafcillin), unless clinically contraindicated. CRITICAL RESULT CALLED TO, READ BACK BY AND VERIFIED WITH: C BENDER PHARMD 09/27/17 0721 JDW    Methicillin resistance NOT DETECTED NOT DETECTED Final   Streptococcus species NOT DETECTED NOT DETECTED Final   Streptococcus agalactiae NOT DETECTED NOT DETECTED Final   Streptococcus pneumoniae NOT DETECTED NOT DETECTED Final   Streptococcus pyogenes NOT DETECTED NOT DETECTED Final   Acinetobacter baumannii NOT DETECTED NOT DETECTED Final   Enterobacteriaceae species NOT DETECTED NOT DETECTED Final   Enterobacter cloacae complex NOT DETECTED NOT DETECTED Final   Escherichia coli NOT DETECTED NOT DETECTED Final   Klebsiella oxytoca NOT DETECTED NOT DETECTED Final   Klebsiella pneumoniae NOT DETECTED NOT DETECTED Final   Proteus species NOT DETECTED NOT DETECTED Final   Serratia marcescens NOT DETECTED NOT DETECTED Final   Haemophilus influenzae NOT DETECTED NOT DETECTED Final   Neisseria meningitidis NOT DETECTED NOT DETECTED Final   Pseudomonas aeruginosa NOT DETECTED NOT DETECTED Final   Candida albicans NOT DETECTED NOT DETECTED Final   Candida glabrata NOT DETECTED NOT DETECTED Final   Candida krusei NOT DETECTED NOT DETECTED Final   Candida parapsilosis NOT DETECTED NOT DETECTED Final   Candida tropicalis NOT DETECTED NOT DETECTED Final    Radiology Reports Ct Abdomen Pelvis Wo Contrast  Result Date: 09/26/2017 CLINICAL DATA:  Fall with low  back pain EXAM: CT ABDOMEN AND PELVIS WITHOUT CONTRAST TECHNIQUE: Multidetector CT imaging of the  abdomen and pelvis was performed following the standard protocol without IV contrast. COMPARISON:  01/10/2013 CT FINDINGS: Lower chest: Lung bases demonstrate small pleural effusions. No consolidation. Cardiomegaly. Coronary vascular calcification. Hepatobiliary: No focal liver abnormality is seen. No gallstones, gallbladder wall thickening, or biliary dilatation. Pancreas: Unremarkable. No pancreatic ductal dilatation or surrounding inflammatory changes. Spleen: Normal in size without focal abnormality. Adrenals/Urinary Tract: Adrenal glands are unremarkable. Kidneys are normal, without renal calculi, focal lesion, or hydronephrosis. Bladder is unremarkable. Stomach/Bowel: Stomach is within normal limits. Interval appendectomy. No evidence of bowel wall thickening, distention, or inflammatory changes. Vascular/Lymphatic: Moderate aortic atherosclerosis. No aneurysmal dilatation. No significantly enlarged lymph nodes Reproductive: Prostate is unremarkable. Other: Negative for free air or free fluid. Small fat in the inguinal canals. Musculoskeletal: Scoliosis of the spine. Trace retrolisthesis L1 on L2 and L2 on L3. Multiple level degenerative change. No acute osseous abnormality. IMPRESSION: 1. No CT evidence for acute intra-abdominal or pelvic abnormality. 2. Cardiomegaly.  Small pleural effusions. Electronically Signed   By: Donavan Foil M.D.   On: 09/26/2017 19:42   Dg Chest 2 View  Result Date: 09/26/2017 CLINICAL DATA:  [place] 2 days ago at home, fever, history coronary disease post CABG, hypertension, diabetes mellitus, diabetic neuropathy and medial LEFT foot wound EXAM: CHEST - 2 VIEW COMPARISON:  01/10/2013 FINDINGS: Enlargement of cardiac silhouette post CABG. Mediastinal contours and pulmonary vascularity normal. Atherosclerotic calcification aorta. Decreased lung volumes with mild bibasilar atelectasis.  Remaining lungs clear. No pleural effusion or pneumothorax. Bones demineralized. IMPRESSION: Enlargement of cardiac silhouette post CABG. Bibasilar atelectasis. Electronically Signed   By: Lavonia Dana M.D.   On: 09/26/2017 15:02   Dg Foot Complete Left  Result Date: 09/26/2017 CLINICAL DATA:  [place].  Pain.  Medial wound. EXAM: LEFT FOOT - COMPLETE 3+ VIEW COMPARISON:  None. FINDINGS: There is no evidence of fracture or dislocation. There is no evidence of arthropathy or other focal bone abnormality. Soft tissues are unremarkable. IMPRESSION: Negative. Electronically Signed   By: Staci Righter M.D.   On: 09/26/2017 15:01    Lab Data:  CBC: Recent Labs  Lab 09/26/17 1551 09/27/17 0042 09/28/17 0435  WBC 23.3* 18.9* 16.2*  NEUTROABS 21.6* 17.7*  --   HGB 11.0* 9.7* 9.6*  HCT 35.2* 29.8* 29.6*  MCV 109.7* 103.5* 103.9*  PLT 175 177 790   Basic Metabolic Panel: Recent Labs  Lab 09/26/17 1551 09/27/17 0042 09/28/17 0435  NA 139 137 135  K 4.7 4.2 3.9  CL 102 106 106  CO2 19* 17* 21*  GLUCOSE 207* 262* 145*  BUN 31* 31* 37*  CREATININE 1.82* 1.69* 1.44*  CALCIUM 8.5* 7.7* 7.7*   GFR: Estimated Creatinine Clearance: 30 mL/min (A) (by C-G formula based on SCr of 1.44 mg/dL (H)). Liver Function Tests: Recent Labs  Lab 09/26/17 1551 09/27/17 0042  AST 32 29  ALT 26 23  ALKPHOS 66 55  BILITOT 1.8* 1.4*  PROT 5.9* 5.3*  ALBUMIN 2.9* 2.5*   No results for input(s): LIPASE, AMYLASE in the last 168 hours. No results for input(s): AMMONIA in the last 168 hours. Coagulation Profile: Recent Labs  Lab 09/26/17 2212  INR 1.56   Cardiac Enzymes: Recent Labs  Lab 09/26/17 2212  CKTOTAL 113   BNP (last 3 results) No results for input(s): PROBNP in the last 8760 hours. HbA1C: No results for input(s): HGBA1C in the last 72 hours. CBG: Recent Labs  Lab 09/27/17 1224 09/27/17 1634 09/27/17 2133 09/28/17 0756 09/28/17 1233  GLUCAP 211* 174* 139* 129* 177*   Lipid  Profile: No results for input(s): CHOL, HDL, LDLCALC, TRIG, CHOLHDL, LDLDIRECT in the last 72 hours. Thyroid Function Tests: No results for input(s): TSH, T4TOTAL, FREET4, T3FREE, THYROIDAB in the last 72 hours. Anemia Panel: No results for input(s): VITAMINB12, FOLATE, FERRITIN, TIBC, IRON, RETICCTPCT in the last 72 hours. Urine analysis:    Component Value Date/Time   COLORURINE AMBER (A) 09/26/2017 1732   APPEARANCEUR CLEAR 09/26/2017 1732   APPEARANCEUR Clear 07/21/2016 1617   LABSPEC 1.020 09/26/2017 1732   PHURINE 5.0 09/26/2017 1732   GLUCOSEU NEGATIVE 09/26/2017 1732   HGBUR NEGATIVE 09/26/2017 1732   BILIRUBINUR SMALL (A) 09/26/2017 1732   BILIRUBINUR Positive (A) 07/21/2016 1617   KETONESUR 5 (A) 09/26/2017 1732   PROTEINUR 30 (A) 09/26/2017 1732   UROBILINOGEN negative 03/12/2015 1619   NITRITE NEGATIVE 09/26/2017 1732   LEUKOCYTESUR NEGATIVE 09/26/2017 1732   LEUKOCYTESUR Negative 07/21/2016 1617     America Sandall M.D. [facility] 09/28/2017, 12:59 PM  Pager: [phone] Between 7am to 7pm - call Pager - [phone]  After 7pm go to [email or web address] - password [email or web address]  Call night coverage person covering after 7pm

## 2017-09-29 ENCOUNTER — Inpatient Hospital Stay (HOSPITAL_COMMUNITY): Payer: Medicare Other

## 2017-09-29 DIAGNOSIS — M545 Low back pain: Secondary | ICD-10-CM

## 2017-09-29 DIAGNOSIS — L89899 Pressure ulcer of other site, unspecified stage: Secondary | ICD-10-CM

## 2017-09-29 LAB — CULTURE, BLOOD (ROUTINE X 2)

## 2017-09-29 LAB — CBC
HCT: 32.2 % — ABNORMAL LOW (ref 39.0–52.0)
HEMOGLOBIN: 10.4 g/dL — AB (ref 13.0–17.0)
MCH: 33 pg (ref 26.0–34.0)
MCHC: 32.3 g/dL (ref 30.0–36.0)
MCV: 102.2 fL — ABNORMAL HIGH (ref 78.0–100.0)
Platelets: 274 10*3/uL (ref 150–400)
RBC: 3.15 MIL/uL — ABNORMAL LOW (ref 4.22–5.81)
RDW: 13.8 % (ref 11.5–15.5)
WBC: 18.3 10*3/uL — AB (ref 4.0–10.5)

## 2017-09-29 LAB — BASIC METABOLIC PANEL
ANION GAP: 12 (ref 5–15)
BUN: 36 mg/dL — ABNORMAL HIGH (ref 8–23)
CO2: 17 mmol/L — ABNORMAL LOW (ref 22–32)
Calcium: 8 mg/dL — ABNORMAL LOW (ref 8.9–10.3)
Chloride: 108 mmol/L (ref 98–111)
Creatinine, Ser: 1.32 mg/dL — ABNORMAL HIGH (ref 0.61–1.24)
GFR calc Af Amer: 51 mL/min — ABNORMAL LOW (ref >60)
GFR, EST NON AFRICAN AMERICAN: 44 mL/min — AB (ref >60)
GLUCOSE: 187 mg/dL — AB (ref 70–99)
POTASSIUM: 3.9 mmol/L (ref 3.5–5.1)
Sodium: 137 mmol/L (ref 135–145)

## 2017-09-29 LAB — GLUCOSE, CAPILLARY
GLUCOSE-CAPILLARY: 150 mg/dL — AB (ref 70–99)
GLUCOSE-CAPILLARY: 150 mg/dL — AB (ref 70–99)
GLUCOSE-CAPILLARY: 151 mg/dL — AB (ref 70–99)
Glucose-Capillary: 181 mg/dL — ABNORMAL HIGH (ref 70–99)

## 2017-09-29 LAB — BRAIN NATRIURETIC PEPTIDE: B NATRIURETIC PEPTIDE 5: 3317.6 pg/mL — AB (ref 0.0–100.0)

## 2017-09-29 MED ORDER — LEVALBUTEROL HCL 0.63 MG/3ML IN NEBU
0.6300 mg | INHALATION_SOLUTION | Freq: Four times a day (QID) | RESPIRATORY_TRACT | Status: DC
  Administered 2017-09-29 – 2017-09-30 (×3): 0.63 mg via RESPIRATORY_TRACT
  Filled 2017-09-29 (×4): qty 3

## 2017-09-29 MED ORDER — FUROSEMIDE 10 MG/ML IJ SOLN
40.0000 mg | Freq: Once | INTRAMUSCULAR | Status: AC
  Administered 2017-09-29: 40 mg via INTRAVENOUS
  Filled 2017-09-29: qty 4

## 2017-09-29 MED ORDER — FUROSEMIDE 10 MG/ML IJ SOLN
40.0000 mg | Freq: Two times a day (BID) | INTRAMUSCULAR | Status: DC
  Administered 2017-09-29 – 2017-10-02 (×6): 40 mg via INTRAVENOUS
  Filled 2017-09-29 (×6): qty 4

## 2017-09-29 NOTE — Progress Notes (Signed)
[facility]  Date of Admission:  09/26/2017     Total days of antibiotics 3         ASSESSMENT/PLAN  Mr. Rump is a 84 y/o male with previous medical history of Alzheimer's disease, CAD, and diabetes who experienced a mechanical fall and was admitted with back pain and increasing fatigue/lethargy. Found to have MSSA bacteremia.   Mr. Everman is on Day 3 of antimicrobial therapy with Cefazolin. Tolerating medication with no adverse side effects. Repeat blood cultures remain pending. He has been afebrile for the past 48 hours with a stable WBC count around 18. Source of the bacteremia remains unclear with likely source being the foot, although cannot rule out vertebral osteomyelitis/disciits or endocarditis.  No evidence of vegetation on TTE. Planning for 6 weeks of IV therapy once cultures clear with insertion of PICC line. Family considering MRI once breathing improves. ID continues to recommend TEE and MRI of the left foot. He also does appear to be fluid overloaded with a BNP over 3,000 and has received a dose of furosemide for this.    1. Continue current dosage of Cefazolin.  2. Edema per primary team.   Principal Problem:   MSSA bacteremia Active Problems:   HTN (hypertension)   CAD (coronary artery disease)   Hypothyroidism   Late onset Alzheimer's disease without behavioral disturbance   SIRS (systemic inflammatory response syndrome) (HCC)   Diabetes mellitus type II, non insulin dependent (HCC)   Macrocytic anemia   Low back pain   Pressure injury of deep tissue of left foot   CKD (chronic kidney disease), stage III ([place])   . aspirin EC  81 mg Oral Daily  . B-complex with vitamin C  1 tablet Oral QODAY  . donepezil  10 mg Oral QHS  . famotidine  20 mg Oral Daily  . heparin  5,000 Units Subcutaneous Q8H  . insulin aspart  0-5 Units Subcutaneous QHS  . insulin aspart  0-9 Units Subcutaneous TID WC  . levalbuterol  0.63 mg Nebulization Q6H  .  levothyroxine  50 mcg Oral QAC breakfast  . metoprolol succinate  50 mg Oral Daily  . multivitamin with minerals  1 tablet Oral Daily  . sodium chloride flush  3 mL Intravenous Q12H    SUBJECTIVE:  Afebrile overnight. Chest x-ray reviewed and suggestive of pulmonary edema. Lab work shows BNP 3,317; WBC count of 18.3; and Creatinine of 1.32. Repeat cultures remain pending.   No Known Allergies   Review of Systems: Review of Systems  Unable to perform ROS: Dementia      OBJECTIVE: Vitals:   09/29/17 0624 09/29/17 0625 09/29/17 0936 09/29/17 0941  BP:  (!) 129/92  (!) 170/92  Pulse:  (!) 104  (!) 119  Resp:  (!) 21    Temp:  98.3 F (36.8 C)    TempSrc:  Oral    SpO2:  90% 95% 98%  Weight: 185 lb (83.9 kg)     Height:       Body mass index is 27.32 kg/m.  Physical Exam  Constitutional: He is oriented to person, place, and time. He appears well-developed and well-nourished. He appears lethargic. He appears ill. No distress. Nasal cannula in place.  Cardiovascular: Regular rhythm, normal heart sounds and intact distal pulses. Tachycardia present. Exam reveals no gallop and no friction rub.  No murmur heard. Pulmonary/Chest: Effort normal. He has wheezes. He has rales.  Neurological: He is oriented to person,  place, and time. He appears lethargic.  Skin: Skin is warm and dry.  Psychiatric: He has a normal mood and affect. His behavior is normal. Judgment and thought content normal.    Lab Results Lab Results  Component Value Date   WBC 18.3 (H) 09/29/2017   HGB 10.4 (L) 09/29/2017   HCT 32.2 (L) 09/29/2017   MCV 102.2 (H) 09/29/2017   PLT 274 09/29/2017    Lab Results  Component Value Date   CREATININE 1.32 (H) 09/29/2017   BUN 36 (H) 09/29/2017   NA 137 09/29/2017   K 3.9 09/29/2017   CL 108 09/29/2017   CO2 17 (L) 09/29/2017    Lab Results  Component Value Date   ALT 23 09/27/2017   AST 29 09/27/2017   ALKPHOS 55 09/27/2017   BILITOT 1.4 (H) 09/27/2017      Microbiology: Recent Results (from the past 240 hour(s))  Urine culture     Status: Abnormal   Collection Time: 09/26/17  5:32 PM  Result Value Ref Range Status   Specimen Description URINE, CLEAN CATCH  Final   Special Requests NONE  Final   Culture (A)  Final    <10,000 COLONIES/mL INSIGNIFICANT GROWTH Performed at [facility], [address], [place], [place] [address]    Report Status 09/27/2017 FINAL  Final  Blood Culture (routine x 2)     Status: Abnormal   Collection Time: 09/26/17  6:41 PM  Result Value Ref Range Status   Specimen Description BLOOD RIGHT ANTECUBITAL  Final   Special Requests   Final    BOTTLES DRAWN AEROBIC AND ANAEROBIC Blood Culture results may not be optimal due to an excessive volume of blood received in culture bottles   Culture  Setup Time   Final    IN BOTH AEROBIC AND ANAEROBIC BOTTLES GRAM POSITIVE COCCI CRITICAL RESULT CALLED TO, READ BACK BY AND VERIFIED WITH: C BENDER PHARMD 09/27/17 5462 JDW Performed at [facility], [place] [address] [place],  [address]    Culture STAPHYLOCOCCUS AUREUS (A)  Final   Report Status 09/29/2017 FINAL  Final   Organism ID, Bacteria STAPHYLOCOCCUS AUREUS  Final      Susceptibility   Staphylococcus aureus - MIC*    CIPROFLOXACIN <=0.5 SENSITIVE Sensitive     ERYTHROMYCIN <=0.25 SENSITIVE Sensitive     GENTAMICIN <=0.5 SENSITIVE Sensitive     OXACILLIN 0.5 SENSITIVE Sensitive     TETRACYCLINE >=16 RESISTANT Resistant     VANCOMYCIN 1 SENSITIVE Sensitive     TRIMETH/SULFA <=10 SENSITIVE Sensitive     CLINDAMYCIN <=0.25 SENSITIVE Sensitive     RIFAMPIN <=0.5 SENSITIVE Sensitive     Inducible Clindamycin NEGATIVE Sensitive     * STAPHYLOCOCCUS AUREUS  Blood Culture ID Panel (Reflexed)     Status: Abnormal   Collection Time: 09/26/17  6:41 PM  Result Value Ref Range Status   Enterococcus species NOT DETECTED NOT DETECTED Final   Listeria monocytogenes NOT DETECTED NOT DETECTED Final    Staphylococcus species DETECTED (A) NOT DETECTED Final    Comment: CRITICAL RESULT CALLED TO, READ BACK BY AND VERIFIED WITH: C BENDER PHARMD 09/27/17 0721 JDW    Staphylococcus aureus DETECTED (A) NOT DETECTED Final    Comment: Methicillin (oxacillin) susceptible Staphylococcus aureus (MSSA). Preferred therapy is anti staphylococcal beta lactam antibiotic (Cefazolin or Nafcillin), unless clinically contraindicated. CRITICAL RESULT CALLED TO, READ BACK BY AND VERIFIED WITH: C BENDER PHARMD 09/27/17 0721 JDW    Methicillin  resistance NOT DETECTED NOT DETECTED Final   Streptococcus species NOT DETECTED NOT DETECTED Final   Streptococcus agalactiae NOT DETECTED NOT DETECTED Final   Streptococcus pneumoniae NOT DETECTED NOT DETECTED Final   Streptococcus pyogenes NOT DETECTED NOT DETECTED Final   Acinetobacter baumannii NOT DETECTED NOT DETECTED Final   Enterobacteriaceae species NOT DETECTED NOT DETECTED Final   Enterobacter cloacae complex NOT DETECTED NOT DETECTED Final   Escherichia coli NOT DETECTED NOT DETECTED Final   Klebsiella oxytoca NOT DETECTED NOT DETECTED Final   Klebsiella pneumoniae NOT DETECTED NOT DETECTED Final   Proteus species NOT DETECTED NOT DETECTED Final   Serratia marcescens NOT DETECTED NOT DETECTED Final   Haemophilus influenzae NOT DETECTED NOT DETECTED Final   Neisseria meningitidis NOT DETECTED NOT DETECTED Final   Pseudomonas aeruginosa NOT DETECTED NOT DETECTED Final   Candida albicans NOT DETECTED NOT DETECTED Final   Candida glabrata NOT DETECTED NOT DETECTED Final   Candida krusei NOT DETECTED NOT DETECTED Final   Candida parapsilosis NOT DETECTED NOT DETECTED Final   Candida tropicalis NOT DETECTED NOT DETECTED Final     Terri Piedra, NP [facility] [phone] Pager  09/29/2017  12:27 PM

## 2017-09-29 NOTE — Progress Notes (Signed)
[facility]                                                                              Patient Demographics  [name] [name], is a 84 y.o. male, DOB - Oct 08, 1918, SPQ:[id number]  Admit date - 09/26/2017   Admitting Physician Vianne Bulls, MD  Outpatient Primary MD for the patient is Claretta Fraise, MD  Outpatient specialists:   LOS - 3  days   Medical records reviewed and are as summarized below:    Chief Complaint  Patient presents with  . Fall  . Skin Ulcer       Brief summary   [name] [name] [name] a 84 y.o.malewith medical history significant fordementia, hypertension, hypothyroidism, coronary artery disease, and type 2 diabetes mellitus, now presenting to the emergency department for evaluation of fatigue and low back pain. Patient lives at home with his wife who also has dementia. They have caregivers come up about 8 hours a day for assistance. He was noted to be sleeping most of the day for the past couple days. He had suffered a ground-level mechanical fall a few days ago and had been complaining of low back pain since then. He has not been complaining of abdominal pain and there has not been any diarrhea or vomiting reported.  Patient was admitted for further work-up.  Assessment & Plan    Principal Problem: Severe sepsis secondary to MSSA bacteremia, nonhealing neuropathic ulcers to the left foot -Unclear source, possibly due to left foot, UA negative for UTI. -2D echo showed EF of 25 to 30% but no obvious vegetations -Infectious disease was consulted, recommended continue IV Ancef -Patient has been more lethargic, complaining of back pain as well in the last few weeks. -Discussed again with patient's daughter and son-in-law at the bedside, patient is having respiratory difficulty with pulmonary edema today.  They are okay with the MRI of the left foot to start with and lumbar spine x-rays.  Did not want to pursue TEE yet due to his respiratory  status.  -Ordered MRI of the left foot with lumbar spine x-rays (did not want MRI of the lumbar spine)  Active problems  Acute on chronic systolic and diastolic CHF exacerbation with pulmonary edema, acute hypoxic respiratory failure -Patient was noted to be dyspneic, wheezing, difficulty breathing at the time of my encounter -Stat chest x-ray showed pulmonary edema, BNP 3317.6.  Likely due to volume overload, patient had received IV fluid hydration due to severe sepsis at the time of admission. -Given Lasix 40 mg IV x1 with improvement in the respiratory status, placed on Lasix 40 mg IV every 12 hours -Patient's family okay with BiPAP if needed.  DNR status. -2D echo 7/2 showed EF of 25 to 30%, with diffuse hypokinesis.  Patient had seen Dr. Percival Spanish in 10/2016, family requested consulting Dr. Percival Spanish if no improvement. -Continue Toprol-XL, no ACEI or ARB secondary to renal insufficiency.  Acute encephalopathy superimposed on dementia -Continue Aricept, -Family requested skilled nursing facility at the time of discharge, social work consulted  Diabetes mellitus type 2, uncontrolled with hyperglycemia, NIDDM -Continue to hold Amaryl -Placed on sliding scale insulin  Essential hypertension -Continue Toprol-XL - will place on hydralazine as needed with parameters  History of coronary artery disease -Continue aspirin, Toprol-XL -2D echo showed EF of 25 to 30% with grade 1 diastolic dysfunction, diffuse hypokinesis -Not a candidate for invasive interventions at age, advanced dementia, continue medical management.    Hypothyroidism -Continue Synthroid  Mild acute on chronic kidney disease stage III with lactic acidosis -Baseline creatinine 1.2-1.5 -Creatinine improving 1.3 today  Code Status: DNR DVT Prophylaxis: Heparin subcu Family Communication: Discussed in detail with the patient, all imaging results, lab results explained to the patient's  daughter and son-in-law at the  bedside   Disposition Plan:   Time Spent in minutes 35  minutes,   Procedures:  2D echo: EF 25 to 30% with grade 1 diastolic dysfunction, diffuse hypokinesis   Consultants:   Infectious disease   Antimicrobials:      Medications  Scheduled Meds: . aspirin EC  81 mg Oral Daily  . B-complex with vitamin C  1 tablet Oral QODAY  . donepezil  10 mg Oral QHS  . famotidine  20 mg Oral Daily  . furosemide  40 mg Intravenous Q12H  . heparin  5,000 Units Subcutaneous Q8H  . insulin aspart  0-5 Units Subcutaneous QHS  . insulin aspart  0-9 Units Subcutaneous TID WC  . levalbuterol  0.63 mg Nebulization Q6H  . levothyroxine  50 mcg Oral QAC breakfast  . metoprolol succinate  50 mg Oral Daily  . multivitamin with minerals  1 tablet Oral Daily  . sodium chloride flush  3 mL Intravenous Q12H   Continuous Infusions: .  ceFAZolin (ANCEF) IV 2 g (09/29/17 1024)   PRN Meds:.acetaminophen **OR** acetaminophen, bisacodyl, fentaNYL (SUBLIMAZE) injection, hydrALAZINE, HYDROcodone-acetaminophen, ondansetron **OR** ondansetron (ZOFRAN) IV, senna-docusate   Antibiotics   Anti-infectives (From admission, onward)   Start     Dose/Rate Route Frequency Ordered Stop   09/27/17 1200  vancomycin (VANCOCIN) IVPB 750 mg/150 ml premix  Status:  Discontinued     750 mg 150 mL/hr over 60 Minutes Intravenous Every 24 hours 09/26/17 2054 09/27/17 0735   09/27/17 1000  ceFAZolin (ANCEF) IVPB 2g/100 mL premix     2 g 200 mL/hr over 30 Minutes Intravenous Every 12 hours 09/27/17 0737     09/27/17 0000  ceFEPIme (MAXIPIME) 1 g in sodium chloride 0.9 % 100 mL IVPB  Status:  Discontinued     1 g 200 mL/hr over 30 Minutes Intravenous Every 24 hours 09/26/17 2054 09/27/17 0735   09/26/17 1815  vancomycin (VANCOCIN) IVPB 1000 mg/200 mL premix     1,000 mg 200 mL/hr over 60 Minutes Intravenous  Once 09/26/17 1813 09/26/17 2051   09/26/17 1815  piperacillin-tazobactam (ZOSYN) IVPB 3.375 g     3.375 g 100  mL/hr over 30 Minutes Intravenous  Once 09/26/17 1813 09/26/17 1934        Subjective:   [name] [name] was seen and examined today.  Feeling short of breath, difficulty breathing, wheezing.  No fevers or chills.  Difficult to obtain review of system from the patient.   Objective:   Vitals:   09/29/17 0624 09/29/17 0625 09/29/17 0936 09/29/17 0941  BP:  (!) 129/92  (!) 170/92  Pulse:  (!) 104  (!) 119  Resp:  (!) 21    Temp:  98.3 F (36.8 C)    TempSrc:  Oral    SpO2:  90% 95% 98%  Weight: 83.9 kg (185 lb)     Height:  Intake/Output Summary (Last 24 hours) at 09/29/2017 1338 Last data filed at 09/29/2017 0900 Gross per 24 hour  Intake 360 ml  Output 100 ml  Net 260 ml     Wt Readings from Last 3 Encounters:  09/29/17 83.9 kg (185 lb)  09/13/17 78.6 kg (173 lb 4 oz)  05/03/17 81.6 kg (180 lb)     Exam    General: Alert and awake, short of breath with accessory muscle use  Eyes:   HEENT:    Cardiovascular: S1 S2 auscultated, Regular rate and rhythm. No pedal edema b/l  Respiratory: Diffuse bilateral wheezing  Gastrointestinal: Soft, nontender, nondistended, + bowel sounds  Ext: no pedal edema bilaterally  Neuro:   Musculoskeletal: No digital cyanosis, clubbing  Skin: Left foot ulcers, dry  Psych: much more alert and awake today   Data Reviewed:  I have personally reviewed following labs and imaging studies  Micro Results Recent Results (from the past 240 hour(s))  Urine culture     Status: Abnormal   Collection Time: 09/26/17  5:32 PM  Result Value Ref Range Status   Specimen Description URINE, CLEAN CATCH  Final   Special Requests NONE  Final   Culture (A)  Final    <10,000 COLONIES/mL INSIGNIFICANT GROWTH Performed at [facility], [address] [place], [place] [address]    Report Status 09/27/2017 FINAL  Final  Blood Culture (routine x 2)     Status: Abnormal   Collection Time: 09/26/17  6:41 PM  Result Value Ref Range  Status   Specimen Description BLOOD RIGHT ANTECUBITAL  Final   Special Requests   Final    BOTTLES DRAWN AEROBIC AND ANAEROBIC Blood Culture results may not be optimal due to an excessive volume of blood received in culture bottles   Culture  Setup Time   Final    IN BOTH AEROBIC AND ANAEROBIC BOTTLES GRAM POSITIVE COCCI CRITICAL RESULT CALLED TO, READ BACK BY AND VERIFIED WITH: C BENDER PHARMD 09/27/17 8469 JDW Performed at [facility], [place] [address] [place], [place] [address]    Culture STAPHYLOCOCCUS AUREUS (A)  Final   Report Status 09/29/2017 FINAL  Final   Organism ID, Bacteria STAPHYLOCOCCUS AUREUS  Final      Susceptibility   Staphylococcus aureus - MIC*    CIPROFLOXACIN <=0.5 SENSITIVE Sensitive     ERYTHROMYCIN <=0.25 SENSITIVE Sensitive     GENTAMICIN <=0.5 SENSITIVE Sensitive     OXACILLIN 0.5 SENSITIVE Sensitive     TETRACYCLINE >=16 RESISTANT Resistant     VANCOMYCIN 1 SENSITIVE Sensitive     TRIMETH/SULFA <=10 SENSITIVE Sensitive     CLINDAMYCIN <=0.25 SENSITIVE Sensitive     RIFAMPIN <=0.5 SENSITIVE Sensitive     Inducible Clindamycin NEGATIVE Sensitive     * STAPHYLOCOCCUS AUREUS  Blood Culture ID Panel (Reflexed)     Status: Abnormal   Collection Time: 09/26/17  6:41 PM  Result Value Ref Range Status   Enterococcus species NOT DETECTED NOT DETECTED Final   Listeria monocytogenes NOT DETECTED NOT DETECTED Final   Staphylococcus species DETECTED (A) NOT DETECTED Final    Comment: CRITICAL RESULT CALLED TO, READ BACK BY AND VERIFIED WITH: C BENDER PHARMD 09/27/17 0721 JDW    Staphylococcus aureus DETECTED (A) NOT DETECTED Final    Comment: Methicillin (oxacillin) susceptible Staphylococcus aureus (MSSA). Preferred therapy is anti staphylococcal beta lactam antibiotic (Cefazolin or Nafcillin), unless clinically contraindicated. CRITICAL RESULT CALLED TO, READ BACK BY AND VERIFIED WITH: C  BENDER PHARMD 09/27/17 0721 JDW    Methicillin resistance NOT DETECTED NOT  DETECTED Final   Streptococcus species NOT DETECTED NOT DETECTED Final   Streptococcus agalactiae NOT DETECTED NOT DETECTED Final   Streptococcus pneumoniae NOT DETECTED NOT DETECTED Final   Streptococcus pyogenes NOT DETECTED NOT DETECTED Final   Acinetobacter baumannii NOT DETECTED NOT DETECTED Final   Enterobacteriaceae species NOT DETECTED NOT DETECTED Final   Enterobacter cloacae complex NOT DETECTED NOT DETECTED Final   Escherichia coli NOT DETECTED NOT DETECTED Final   Klebsiella oxytoca NOT DETECTED NOT DETECTED Final   Klebsiella pneumoniae NOT DETECTED NOT DETECTED Final   Proteus species NOT DETECTED NOT DETECTED Final   Serratia marcescens NOT DETECTED NOT DETECTED Final   Haemophilus influenzae NOT DETECTED NOT DETECTED Final   Neisseria meningitidis NOT DETECTED NOT DETECTED Final   Pseudomonas aeruginosa NOT DETECTED NOT DETECTED Final   Candida albicans NOT DETECTED NOT DETECTED Final   Candida glabrata NOT DETECTED NOT DETECTED Final   Candida krusei NOT DETECTED NOT DETECTED Final   Candida parapsilosis NOT DETECTED NOT DETECTED Final   Candida tropicalis NOT DETECTED NOT DETECTED Final    Radiology Reports Ct Abdomen Pelvis Wo Contrast  Result Date: 09/26/2017 CLINICAL DATA:  Fall with low back pain EXAM: CT ABDOMEN AND PELVIS WITHOUT CONTRAST TECHNIQUE: Multidetector CT imaging of the abdomen and pelvis was performed following the standard protocol without IV contrast. COMPARISON:  01/10/2013 CT FINDINGS: Lower chest: Lung bases demonstrate small pleural effusions. No consolidation. Cardiomegaly. Coronary vascular calcification. Hepatobiliary: No focal liver abnormality is seen. No gallstones, gallbladder wall thickening, or biliary dilatation. Pancreas: Unremarkable. No pancreatic ductal dilatation or surrounding inflammatory changes. Spleen: Normal in size without focal abnormality. Adrenals/Urinary Tract: Adrenal glands are unremarkable. Kidneys are normal, without  renal calculi, focal lesion, or hydronephrosis. Bladder is unremarkable. Stomach/Bowel: Stomach is within normal limits. Interval appendectomy. No evidence of bowel wall thickening, distention, or inflammatory changes. Vascular/Lymphatic: Moderate aortic atherosclerosis. No aneurysmal dilatation. No significantly enlarged lymph nodes Reproductive: Prostate is unremarkable. Other: Negative for free air or free fluid. Small fat in the inguinal canals. Musculoskeletal: Scoliosis of the spine. Trace retrolisthesis L1 on L2 and L2 on L3. Multiple level degenerative change. No acute osseous abnormality. IMPRESSION: 1. No CT evidence for acute intra-abdominal or pelvic abnormality. 2. Cardiomegaly.  Small pleural effusions. Electronically Signed   By: Donavan Foil M.D.   On: 09/26/2017 19:42   Dg Chest 2 View  Result Date: 09/26/2017 CLINICAL DATA:  [place] 2 days ago at home, fever, history coronary disease post CABG, hypertension, diabetes mellitus, diabetic neuropathy and medial LEFT foot wound EXAM: CHEST - 2 VIEW COMPARISON:  01/10/2013 FINDINGS: Enlargement of cardiac silhouette post CABG. Mediastinal contours and pulmonary vascularity normal. Atherosclerotic calcification aorta. Decreased lung volumes with mild bibasilar atelectasis. Remaining lungs clear. No pleural effusion or pneumothorax. Bones demineralized. IMPRESSION: Enlargement of cardiac silhouette post CABG. Bibasilar atelectasis. Electronically Signed   By: Lavonia Dana M.D.   On: 09/26/2017 15:02   Dg Chest Port 1 View  Result Date: 09/29/2017 CLINICAL DATA:  Dyspnea EXAM: PORTABLE CHEST 1 VIEW COMPARISON:  09/26/2017; 12/31/2012; 04/02/2009 FINDINGS: Grossly unchanged enlarged cardiac silhouette and mediastinal contours with atherosclerotic plaque within a tortuous and potentially ectatic thoracic aorta. Post median sternotomy and CABG. Pulmonary vasculature appears less distinct on the present examination with cephalization of flow. Suspected trace  layering bilateral effusions. Worsening bibasilar heterogeneous/consolidative opacities, left greater than right. No pneumothorax. No acute osseus abnormalities. IMPRESSION: Findings  most suggestive of pulmonary edema with suspected trace bilateral effusions and associated bibasilar opacities, atelectasis versus infiltrate. Electronically Signed   By: Sandi Mariscal M.D.   On: 09/29/2017 09:05   Dg Foot Complete Left  Result Date: 09/26/2017 CLINICAL DATA:  [place].  Pain.  Medial wound. EXAM: LEFT FOOT - COMPLETE 3+ VIEW COMPARISON:  None. FINDINGS: There is no evidence of fracture or dislocation. There is no evidence of arthropathy or other focal bone abnormality. Soft tissues are unremarkable. IMPRESSION: Negative. Electronically Signed   By: Staci Righter M.D.   On: 09/26/2017 15:01    Lab Data:  CBC: Recent Labs  Lab 09/26/17 1551 09/27/17 0042 09/28/17 0435 09/29/17 0942  WBC 23.3* 18.9* 16.2* 18.3*  NEUTROABS 21.6* 17.7*  --   --   HGB 11.0* 9.7* 9.6* 10.4*  HCT 35.2* 29.8* 29.6* 32.2*  MCV 109.7* 103.5* 103.9* 102.2*  PLT 175 177 180 510   Basic Metabolic Panel: Recent Labs  Lab 09/26/17 1551 09/27/17 0042 09/28/17 0435 09/29/17 0942  NA 139 137 135 137  K 4.7 4.2 3.9 3.9  CL 102 106 106 108  CO2 19* 17* 21* 17*  GLUCOSE 207* 262* 145* 187*  BUN 31* 31* 37* 36*  CREATININE 1.82* 1.69* 1.44* 1.32*  CALCIUM 8.5* 7.7* 7.7* 8.0*   GFR: Estimated Creatinine Clearance: 32.7 mL/min (A) (by C-G formula based on SCr of 1.32 mg/dL (H)). Liver Function Tests: Recent Labs  Lab 09/26/17 1551 09/27/17 0042  AST 32 29  ALT 26 23  ALKPHOS 66 55  BILITOT 1.8* 1.4*  PROT 5.9* 5.3*  ALBUMIN 2.9* 2.5*   No results for input(s): LIPASE, AMYLASE in the last 168 hours. No results for input(s): AMMONIA in the last 168 hours. Coagulation Profile: Recent Labs  Lab 09/26/17 2212  INR 1.56   Cardiac Enzymes: Recent Labs  Lab 09/26/17 2212  CKTOTAL 113   BNP (last 3  results) No results for input(s): PROBNP in the last 8760 hours. HbA1C: No results for input(s): HGBA1C in the last 72 hours. CBG: Recent Labs  Lab 09/28/17 1233 09/28/17 1624 09/28/17 2104 09/29/17 0757 09/29/17 1158  GLUCAP 177* 167* 127* 150* 181*   Lipid Profile: No results for input(s): CHOL, HDL, LDLCALC, TRIG, CHOLHDL, LDLDIRECT in the last 72 hours. Thyroid Function Tests: No results for input(s): TSH, T4TOTAL, FREET4, T3FREE, THYROIDAB in the last 72 hours. Anemia Panel: No results for input(s): VITAMINB12, FOLATE, FERRITIN, TIBC, IRON, RETICCTPCT in the last 72 hours. Urine analysis:    Component Value Date/Time   COLORURINE AMBER (A) 09/26/2017 1732   APPEARANCEUR CLEAR 09/26/2017 1732   APPEARANCEUR Clear 07/21/2016 1617   LABSPEC 1.020 09/26/2017 1732   PHURINE 5.0 09/26/2017 1732   GLUCOSEU NEGATIVE 09/26/2017 1732   HGBUR NEGATIVE 09/26/2017 1732   BILIRUBINUR SMALL (A) 09/26/2017 1732   BILIRUBINUR Positive (A) 07/21/2016 1617   KETONESUR 5 (A) 09/26/2017 1732   PROTEINUR 30 (A) 09/26/2017 1732   UROBILINOGEN negative 03/12/2015 1619   NITRITE NEGATIVE 09/26/2017 1732   LEUKOCYTESUR NEGATIVE 09/26/2017 1732   LEUKOCYTESUR Negative 07/21/2016 1617     Ireene Ballowe M.D. [facility] 09/29/2017, 1:38 PM  Pager: [phone] Between 7am to 7pm - call Pager - [phone]  After 7pm go to [email or web address] - password [email or web address]  Call night coverage person covering after 7pm

## 2017-09-30 DIAGNOSIS — I5021 Acute systolic (congestive) heart failure: Secondary | ICD-10-CM

## 2017-09-30 DIAGNOSIS — E119 Type 2 diabetes mellitus without complications: Secondary | ICD-10-CM

## 2017-09-30 DIAGNOSIS — I2510 Atherosclerotic heart disease of native coronary artery without angina pectoris: Secondary | ICD-10-CM

## 2017-09-30 DIAGNOSIS — N183 Chronic kidney disease, stage 3 (moderate): Secondary | ICD-10-CM

## 2017-09-30 LAB — GLUCOSE, CAPILLARY
Glucose-Capillary: 144 mg/dL — ABNORMAL HIGH (ref 70–99)
Glucose-Capillary: 233 mg/dL — ABNORMAL HIGH (ref 70–99)
Glucose-Capillary: 222 mg/dL — ABNORMAL HIGH (ref 70–99)
Glucose-Capillary: 236 mg/dL — ABNORMAL HIGH (ref 70–99)

## 2017-09-30 LAB — BASIC METABOLIC PANEL
ANION GAP: 7 (ref 5–15)
BUN: 37 mg/dL — ABNORMAL HIGH (ref 8–23)
CO2: 22 mmol/L (ref 22–32)
Calcium: 7.8 mg/dL — ABNORMAL LOW (ref 8.9–10.3)
Chloride: 106 mmol/L (ref 98–111)
Creatinine, Ser: 1.33 mg/dL — ABNORMAL HIGH (ref 0.61–1.24)
GFR calc Af Amer: 50 mL/min — ABNORMAL LOW (ref >60)
GFR calc non Af Amer: 43 mL/min — ABNORMAL LOW (ref >60)
GLUCOSE: 152 mg/dL — AB (ref 70–99)
POTASSIUM: 3.1 mmol/L — AB (ref 3.5–5.1)
Sodium: 135 mmol/L (ref 135–145)

## 2017-09-30 LAB — CBC
HEMATOCRIT: 28.7 % — AB (ref 39.0–52.0)
Hemoglobin: 9.8 g/dL — ABNORMAL LOW (ref 13.0–17.0)
MCH: 33.7 pg (ref 26.0–34.0)
MCHC: 34.1 g/dL (ref 30.0–36.0)
MCV: 98.6 fL (ref 78.0–100.0)
PLATELETS: 249 10*3/uL (ref 150–400)
RBC: 2.91 MIL/uL — ABNORMAL LOW (ref 4.22–5.81)
RDW: 13.6 % (ref 11.5–15.5)
WBC: 13.3 10*3/uL — AB (ref 4.0–10.5)

## 2017-09-30 MED ORDER — IPRATROPIUM-ALBUTEROL 0.5-2.5 (3) MG/3ML IN SOLN
3.0000 mL | RESPIRATORY_TRACT | Status: DC
  Administered 2017-09-30 (×4): 3 mL via RESPIRATORY_TRACT
  Filled 2017-09-30 (×4): qty 3

## 2017-09-30 MED ORDER — INSULIN ASPART 100 UNIT/ML ~~LOC~~ SOLN
0.0000 [IU] | Freq: Three times a day (TID) | SUBCUTANEOUS | Status: DC
  Administered 2017-09-30 (×2): 5 [IU] via SUBCUTANEOUS
  Administered 2017-10-01 (×2): 8 [IU] via SUBCUTANEOUS
  Administered 2017-10-01 – 2017-10-02 (×4): 5 [IU] via SUBCUTANEOUS
  Administered 2017-10-03 (×2): 3 [IU] via SUBCUTANEOUS

## 2017-09-30 MED ORDER — IPRATROPIUM-ALBUTEROL 0.5-2.5 (3) MG/3ML IN SOLN
3.0000 mL | Freq: Three times a day (TID) | RESPIRATORY_TRACT | Status: DC
  Administered 2017-10-01 – 2017-10-03 (×7): 3 mL via RESPIRATORY_TRACT
  Filled 2017-09-30 (×7): qty 3

## 2017-09-30 MED ORDER — METHYLPREDNISOLONE SODIUM SUCC 125 MG IJ SOLR
60.0000 mg | Freq: Two times a day (BID) | INTRAMUSCULAR | Status: DC
  Administered 2017-09-30 – 2017-10-01 (×3): 60 mg via INTRAVENOUS
  Filled 2017-09-30 (×3): qty 2

## 2017-09-30 MED ORDER — MOMETASONE FURO-FORMOTEROL FUM 100-5 MCG/ACT IN AERO
2.0000 | INHALATION_SPRAY | Freq: Two times a day (BID) | RESPIRATORY_TRACT | Status: DC
  Administered 2017-09-30 – 2017-10-03 (×6): 2 via RESPIRATORY_TRACT
  Filled 2017-09-30: qty 8.8

## 2017-09-30 MED ORDER — POTASSIUM CHLORIDE CRYS ER 20 MEQ PO TBCR
40.0000 meq | EXTENDED_RELEASE_TABLET | Freq: Every day | ORAL | Status: DC
  Administered 2017-09-30 – 2017-10-03 (×4): 40 meq via ORAL
  Filled 2017-09-30 (×4): qty 2

## 2017-09-30 NOTE — Progress Notes (Signed)
Physical Therapy Treatment Patient Details Name: [name] [name] MRN: [id number] DOB: 1918/05/30 Today's Date: 09/30/2017    History of Present Illness Pt is a 84 y.o. male presenting with fatigue and low back pain. Pt also noted to have a fall at home a few days prior to admission. PMHx: Dementia, HTN, Hypothyroidism, CAD, DM2.    PT Comments    Patient required min A for bed mobility and mod/max A (+2 for gait) for OOB mobility. Pt with posterior bias in standing and needs assistance for standing balance at all times with use of RW. Pt able to ambulate ~13ft this session with very close chair follow. Vitals WNL with mobility. Continue to progress as tolerated with anticipated d/c to SNF for further skilled PT services.     Follow Up Recommendations  SNF     Equipment Recommendations  (TBD)    Recommendations for Other Services       Precautions / Restrictions Precautions Precautions: Fall Restrictions Weight Bearing Restrictions: No    Mobility  Bed Mobility Overal bed mobility: Needs Assistance Bed Mobility: Rolling;Sidelying to Sit Rolling: Min assist Sidelying to sit: Min assist       General bed mobility comments: cues for sequencing, use of rail, and asssitance to elevate trunk into sitting  Transfers Overall transfer level: Needs assistance Equipment used: Rolling walker (2 wheeled) Transfers: Sit to/from [facility] Sit to Stand: Mod assist Stand pivot transfers: Mod assist;+2 safety/equipment       General transfer comment: pt tends to brace legs on surface for standing balance; cues for hand placement and sequencing; assitance to power up into standing, balance in standing, and guiding RW  Ambulation/Gait Ambulation/Gait assistance: Max assist;+2 safety/equipment Gait Distance (Feet): 8 Feet Assistive device: Rolling walker (2 wheeled) Gait Pattern/deviations: Decreased step length - right;Decreased step length - left;Shuffle;Trunk flexed     General Gait Details: assistance for posture, balance, and guiding RW with close chair follow due to posterior bias    Stairs             Wheelchair Mobility    Modified Rankin (Stroke Patients Only)       Balance Overall balance assessment: Needs assistance Sitting-balance support: Feet supported Sitting balance-Leahy Scale: Fair     Standing balance support: Bilateral upper extremity supported Standing balance-Leahy Scale: Poor Standing balance comment: RW for support                            Cognition Arousal/Alertness: Awake/alert Behavior During Therapy: Flat affect Overall Cognitive Status: History of cognitive impairments - at baseline                                 General Comments: Per daughter, pt at his cognitive baseline      Exercises      General Comments        Pertinent Vitals/Pain Pain Assessment: No/denies pain    Home Living                      Prior Function            PT Goals (current goals can now be found in the care plan section) Acute Rehab PT Goals PT Goal Formulation: With patient/family Time For Goal Achievement: 10/11/17 Potential to Achieve Goals: Good Progress towards PT goals: Progressing toward goals    Frequency  Min 2X/week      PT Plan Current plan remains appropriate    Co-evaluation              AM-PAC PT "6 Clicks" Daily Activity  Outcome Measure  Difficulty turning over in bed (including adjusting bedclothes, sheets and blankets)?: Unable Difficulty moving from lying on back to sitting on the side of the bed? : Unable Difficulty sitting down on and standing up from a chair with arms (e.g., wheelchair, bedside commode, etc,.)?: Unable Help needed moving to and from a bed to chair (including a wheelchair)?: A Little Help needed walking in hospital room?: A Lot Help needed climbing 3-5 steps with a railing? : A Lot 6 Click Score: 10    End of  Session Equipment Utilized During Treatment: Gait belt Activity Tolerance: Patient tolerated treatment well Patient left: in chair;with call bell/phone within reach;with chair alarm set;with family/visitor present Nurse Communication: Mobility status PT Visit Diagnosis: Muscle weakness (generalized) (M62.81);History of falling (Z91.81)     Time: 8657-8469 PT Time Calculation (min) (ACUTE ONLY): 36 min  Charges:  $Gait Training: 8-22 mins $Therapeutic Activity: 8-22 mins                    G Codes:       Earney Navy, PTA Pager: [phone]     Darliss Cheney 09/30/2017, 1:32 PM

## 2017-09-30 NOTE — Consult Note (Addendum)
Cardiology Consultation:   Patient ID: [name] [name]; [id number]; 08/13/1918   Admit date: 09/26/2017 Date of Consult: 09/30/2017  Primary Care Provider: Claretta Fraise, MD Primary Cardiologist: Minus Breeding, MD Primary Electrophysiologist:  None   Patient Profile:   [name] [name] is a 84 y.o. male with a PMH of CAD s/p CABG 1998, HTN, HLD, DM type 2, hypothyroidism, dementia who is being seen today for the evaluation of acute combined CHF at the request of Dr. Tana Coast.  History of Present Illness:   [name] lives at home with 24 hour HHA. He was in his usual state of health until ~1-2 weeks ago when he was noted to be more fatigued than usual. Family reports that he fell out of bed one week ago and was down for an unknown amount of time because he was not discovered until the morning of 09/24/17. Several days later he complained about severe back pain prompting them to bring him to the ED. On arrival to the ED he was found to be febrile to 38.6C and tachycardic and was admitted to medicine for sepsis.   From a cardiac standpoint he was last evaluated by Dr. Percival Spanish 10/2016 and was thought to be doing well. He was without anginal complaints, orthopnea, LE edema, or PND. His last ischemic evaluation was a NST 2011 which was without ischemia. His last echo 2011 revealed EF 50-55%, G1DD, and hypokinesis of basal-mid inferoposterior myocardium. No medication changes occurred at that visit.   At the time of this evaluation, patient is sleeping comfortable in the bedside chair. He is without complaints at this time. History primarily obtained from family at bedside who reports he had been doing well from a cardiac standpoint. He continued to live at home with his wife who also has dementia with the assistance of 24 hour HHAs. He ambulates with a walker and was still able to get to his doctors appointments with the help of family. They noted he would get SOB when he exerted himself. He had no complaints  of CP at rest or with exertion, SOB at rest, orthopnea, PND, LE edema, dizziness, lightheadedness, or syncope. Given advanced age and dementia, family wishes to avoid invasive procedures at this time.   Hospital course: On presentation tho the ED he was febrile and tachycardic, satting in the low 90s on RA; today he has been intermittently hypertensive and tachycardic, otherwise afebrile and VSS. Labs on presentation notable for electrolytes wnl, Cr 1.8 (baseline 1.2), WBC 23.3, Hgb 11.0, PLT 175, lactate elevated (peaked at 6.4), procal elevated to 2.2, BNP 7/4 3317; labs today with K 3.1, Cr 1.33, WBC 13.3, Hgb 9.8, PLT 249. BCx +MSSA. CXR on admission with bibasilar atelectasis; repeat 7/4 with pulmonary edema. XR L foot without acute findings. XR L spine without acute findings. CT A/P with cardiomegaly and small pleural effusions. MR Foot without osteomyelitis/septic arthritis but does show diffuse cellulitis and myofasciitis. Echo with EF 25-30%, G1DD, diffuse hypokinesis, and mild valvular abnormalities. Patient was given IVF given sepsis and started on IV antibiotics. Infectious disease is following for bacteremia. He was given IV lasix for pulmonary edema. Cardiology asked to evaluate for acute combined CHF.   Past Medical History:  Diagnosis Date  . CAD (coronary artery disease) 2002   Nuclear, January, 2011, no ischemia  . Carotid artery disease ([place])    Doppler, February, 2012, 0-39% bilateral  . Diabetic neuropathy ([place])    Possible  . Ejection fraction    EF  50-55%, echo, January, 2011, hypokinesis mid--base inferolateral  . HTN (hypertension)   . Hx of CABG    1998  . Hyperlipidemia   . Respiratory failure ([place])    vent dependent...acquired pneumonia...2008  . Shortness of breath    2011    Past Surgical History:  Procedure Laterality Date  . CARPAL TUNNEL RELEASE    . CORONARY ARTERY BYPASS GRAFT    . KNEE ARTHROPLASTY     total  . LAPAROSCOPIC APPENDECTOMY N/A 01/10/2013    Procedure: APPENDECTOMY LAPAROSCOPIC;  Surgeon: Donato Heinz, MD;  Location: [facility];  Service: General;  Laterality: N/A;     Home Medications:  Prior to Admission medications   Medication Sig Start Date End Date Taking? Authorizing Provider  aspirin 81 MG tablet Take 81 mg by mouth daily.   Yes [provider]  b complex vitamins capsule Take 1 capsule by mouth every other day.    Yes [provider]  donepezil (ARICEPT) 10 MG tablet TAKE 1 TABLET BY MOUTH EVERYDAY AT BEDTIME 09/13/17  Yes Stacks, Cletus Gash, MD  etodolac (LODINE) 200 MG capsule Take 1 capsule (200 mg total) by mouth daily. 09/13/17  Yes Claretta Fraise, MD  famotidine (PEPCID) 20 MG tablet Take 1 tablet (20 mg total) by mouth 2 (two) times daily. Patient taking differently: Take 20 mg by mouth daily.  09/13/17  Yes Stacks, Cletus Gash, MD  fluticasone (FLONASE) 50 MCG/ACT nasal spray Place 2 sprays into both nostrils daily. Patient taking differently: Place 2 sprays into both nostrils as needed.  01/16/16  Yes Stacks, Cletus Gash, MD  glimepiride (AMARYL) 2 MG tablet TAKE 1 TABLET (2 MG TOTAL) BY MOUTH DAILY BEFORE BREAKFAST. 09/13/17  Yes Stacks, Cletus Gash, MD  irbesartan (AVAPRO) 300 MG tablet Take 1 tablet (300 mg total) by mouth daily. 09/13/17  Yes Stacks, Cletus Gash, MD  levothyroxine (SYNTHROID, LEVOTHROID) 50 MCG tablet TAKE 1 TABLET (50 MCG TOTAL) BY MOUTH DAILY. 09/13/17  Yes Stacks, Cletus Gash, MD  metoprolol succinate (TOPROL-XL) 100 MG 24 hr tablet TAKE 1 TABLET (100 MG TOTAL) BY MOUTH DAILY. TAKE WITH OR IMMEDIATELY FOLLOWING A MEAL. 09/13/17  Yes StacksCletus Gash, MD  Multiple Vitamin (MULTIVITAMIN) tablet Take 1 tablet by mouth daily.   Yes [provider]    Inpatient Medications: Scheduled Meds: . aspirin EC  81 mg Oral Daily  . B-complex with vitamin C  1 tablet Oral QODAY  . donepezil  10 mg Oral QHS  . famotidine  20 mg Oral Daily  . furosemide  40 mg Intravenous Q12H  . heparin  5,000 Units  Subcutaneous Q8H  . insulin aspart  0-15 Units Subcutaneous TID WC  . insulin aspart  0-5 Units Subcutaneous QHS  . ipratropium-albuterol  3 mL Nebulization Q4H  . levothyroxine  50 mcg Oral QAC breakfast  . methylPREDNISolone (SOLU-MEDROL) injection  60 mg Intravenous Q12H  . metoprolol succinate  50 mg Oral Daily  . mometasone-formoterol  2 puff Inhalation BID  . multivitamin with minerals  1 tablet Oral Daily  . potassium chloride  40 mEq Oral Daily  . sodium chloride flush  3 mL Intravenous Q12H   Continuous Infusions: .  ceFAZolin (ANCEF) IV 2 g (09/30/17 1050)   PRN Meds: acetaminophen **OR** acetaminophen, bisacodyl, fentaNYL (SUBLIMAZE) injection, hydrALAZINE, HYDROcodone-acetaminophen, ondansetron **OR** ondansetron (ZOFRAN) IV, senna-docusate  Allergies:   No Known Allergies  Social History:   Social History   Socioeconomic History  . Marital status: Married    Spouse name: Not on  file  . Number of children: Not on file  . Years of education: Not on file  . Highest education level: Not on file  Occupational History  . Occupation: Academic librarian: RETIRED  Social Needs  . Financial resource strain: Not on file  . Food insecurity:    Worry: Not on file    Inability: Not on file  . Transportation needs:    Medical: Not on file    Non-medical: Not on file  Tobacco Use  . Smoking status: Never Smoker  . Smokeless tobacco: Never Used  Substance and Sexual Activity  . Alcohol use: No  . Drug use: No  . Sexual activity: Not on file  Lifestyle  . Physical activity:    Days per week: Not on file    Minutes per session: Not on file  . Stress: Not on file  Relationships  . Social connections:    Talks on phone: Not on file    Gets together: Not on file    Attends religious service: Not on file    Active member of club or organization: Not on file    Attends meetings of clubs or organizations: Not on file    Relationship status: Not on file  . Intimate  partner violence:    Fear of current or ex partner: Not on file    Emotionally abused: Not on file    Physically abused: Not on file    Forced sexual activity: Not on file  Other Topics Concern  . Not on file  Social History Narrative  . Not on file    Family History:    Family History  Problem Relation Age of Onset  . Heart disease Father        mother  . Heart attack Father 65       deceased  . Heart failure Mother 23       deceased     ROS:  Please see the history of present illness.   All other ROS reviewed and negative.     Physical Exam/Data:   Vitals:   09/30/17 0119 09/30/17 0420 09/30/17 0838 09/30/17 1201  BP:  (!) 151/69    Pulse:  69    Resp:  18    Temp:  99.8 F (37.7 C)    TempSrc:  Oral    SpO2: 95% 98% 97% 97%  Weight:      Height:        Intake/Output Summary (Last 24 hours) at 09/30/2017 1354 Last data filed at 09/30/2017 0900 Gross per 24 hour  Intake 350 ml  Output 1525 ml  Net -1175 ml   Filed Weights   09/27/17 0500 09/28/17 0623 09/29/17 0624  Weight: 174 lb 2.6 oz (79 kg) 181 lb (82.1 kg) 185 lb (83.9 kg)   Body mass index is 27.32 kg/m.  General:  Elderly obese gentleman who appears younger than stated age, sleeping in the bedside chair in no acute distress. Awakens easily HEENT: sclera anicteric  Neck: no JVD Vascular: No carotid bruits; distal pulses 2+ bilaterally Cardiac:  normal S1, S2; RRR; no murmurs, gallops, or rubs appreciated Lungs:  Decreased breath sounds with crackles at lung bases Abd: NABS, soft, nontender, no hepatomegaly Ext: LUE edema; no LE edema Musculoskeletal:  No deformities, BUE and BLE strength normal and equal Skin: warm and dry  Neuro:  CNs 2-12 intact, no focal abnormalities noted Psych:  Normal affect   EKG:  The EKG was  personally reviewed and demonstrates:  Sinus rhythm with LBBB (previously incomplete LBBB), and brief sinus pause. Telemetry:  Telemetry was personally reviewed and demonstrates:   Sinus rhythm with frequent PACs / PVCs; sinus arrhythmia  Relevant CV Studies:  Echo 09/27/17: Study Conclusions  - Left ventricle: The cavity size was normal. Wall thickness was   increased in a pattern of mild LVH. Systolic function was   severely reduced. The estimated ejection fraction was in the   range of 25% to 30%. Diffuse hypokinesis. Doppler parameters are   consistent with abnormal left ventricular relaxation (grade 1   diastolic dysfunction). - Aortic valve: There was mild regurgitation. - Aortic root: The aortic root was mildly dilated. - Mitral valve: There was mild regurgitation. - Left atrium: The atrium was mildly dilated. - Right ventricle: The cavity size was mildly dilated. - Pulmonary arteries: Systolic pressure was mildly increased. PA   peak pressure: 42 mm Hg (S).  Impressions:  - Severe LV dysfunction; mild diastolic dysfunction; mild LVH; mild   AI; mildly dilated aortic root; mild MR; mild LAE; mild RVE; mild   TR with mild pulmonary hypertension.   NST 2011: Overall Impression  Exercise Capacity: [place] study.  BP Response: Normal blood pressure response. Clinical Symptoms: Nausea ECG Impression: No significant ST segment change suggestive of ischemia. Overall Impression: No ischemia or infarction on perfusion images.  Gated images show mildly decreased systolic function with inferior and inferoseptal hypokinesis.  Overall Impression Comments: Suggest echo to further assess LV function and wall motion.   Laboratory Data:  Chemistry Recent Labs  Lab 09/28/17 0435 09/29/17 0942 09/30/17 0459  NA 135 137 135  K 3.9 3.9 3.1*  CL 106 108 106  CO2 21* 17* 22  GLUCOSE 145* 187* 152*  BUN 37* 36* 37*  CREATININE 1.44* 1.32* 1.33*  CALCIUM 7.7* 8.0* 7.8*  GFRNONAA 39* 44* 43*  GFRAA 46* 51* 50*  ANIONGAP 8 12 7     Recent Labs  Lab 09/26/17 1551 09/27/17 0042  PROT 5.9* 5.3*  ALBUMIN 2.9* 2.5*  AST 32 29  ALT 26 23  ALKPHOS 66  55  BILITOT 1.8* 1.4*   Hematology Recent Labs  Lab 09/28/17 0435 09/29/17 0942 09/30/17 0459  WBC 16.2* 18.3* 13.3*  RBC 2.85* 3.15* 2.91*  HGB 9.6* 10.4* 9.8*  HCT 29.6* 32.2* 28.7*  MCV 103.9* 102.2* 98.6  MCH 33.7 33.0 33.7  MCHC 32.4 32.3 34.1  RDW 13.7 13.8 13.6  PLT 180 274 249   Cardiac EnzymesNo results for input(s): TROPONINI in the last 168 hours. No results for input(s): TROPIPOC in the last 168 hours.  BNP Recent Labs  Lab 09/29/17 0942  BNP 3,317.6*    DDimer No results for input(s): DDIMER in the last 168 hours.  Radiology/Studies:  Ct Abdomen Pelvis Wo Contrast  Result Date: 09/26/2017 CLINICAL DATA:  Fall with low back pain EXAM: CT ABDOMEN AND PELVIS WITHOUT CONTRAST TECHNIQUE: Multidetector CT imaging of the abdomen and pelvis was performed following the standard protocol without IV contrast. COMPARISON:  01/10/2013 CT FINDINGS: Lower chest: Lung bases demonstrate small pleural effusions. No consolidation. Cardiomegaly. Coronary vascular calcification. Hepatobiliary: No focal liver abnormality is seen. No gallstones, gallbladder wall thickening, or biliary dilatation. Pancreas: Unremarkable. No pancreatic ductal dilatation or surrounding inflammatory changes. Spleen: Normal in size without focal abnormality. Adrenals/Urinary Tract: Adrenal glands are unremarkable. Kidneys are normal, without renal calculi, focal lesion, or hydronephrosis. Bladder is unremarkable. Stomach/Bowel: Stomach is within normal limits. Interval appendectomy. No  evidence of bowel wall thickening, distention, or inflammatory changes. Vascular/Lymphatic: Moderate aortic atherosclerosis. No aneurysmal dilatation. No significantly enlarged lymph nodes Reproductive: Prostate is unremarkable. Other: Negative for free air or free fluid. Small fat in the inguinal canals. Musculoskeletal: Scoliosis of the spine. Trace retrolisthesis L1 on L2 and L2 on L3. Multiple level degenerative change. No acute  osseous abnormality. IMPRESSION: 1. No CT evidence for acute intra-abdominal or pelvic abnormality. 2. Cardiomegaly.  Small pleural effusions. Electronically Signed   By: Donavan Foil M.D.   On: 09/26/2017 19:42   Dg Chest 2 View  Result Date: 09/26/2017 CLINICAL DATA:  [place] 2 days ago at home, fever, history coronary disease post CABG, hypertension, diabetes mellitus, diabetic neuropathy and medial LEFT foot wound EXAM: CHEST - 2 VIEW COMPARISON:  01/10/2013 FINDINGS: Enlargement of cardiac silhouette post CABG. Mediastinal contours and pulmonary vascularity normal. Atherosclerotic calcification aorta. Decreased lung volumes with mild bibasilar atelectasis. Remaining lungs clear. No pleural effusion or pneumothorax. Bones demineralized. IMPRESSION: Enlargement of cardiac silhouette post CABG. Bibasilar atelectasis. Electronically Signed   By: Lavonia Dana M.D.   On: 09/26/2017 15:02   Dg Lumbar Spine Complete  Result Date: 09/29/2017 CLINICAL DATA:  Bacteremia.  Back pain EXAM: LUMBAR SPINE - COMPLETE 4+ VIEW COMPARISON:  CT abdomen and pelvis 09/26/2017 FINDINGS: Moderate to advanced diffuse degenerative disc disease, most pronounced at L1-2 and L2-3 with disc space narrowing, spurring, endplate sclerosis and vacuum disc. Diffuse degenerative facet disease, most pronounced in the lower lumbar spine. Slight anterolisthesis of L4 on L5 related to facet disease. No fracture. SI joints are symmetric and unremarkable. IMPRESSION: Moderate to advanced degenerative disc and facet disease. No acute findings. Electronically Signed   By: Rolm Baptise M.D.   On: 09/29/2017 15:59   Mr Foot Left Wo Contrast  Result Date: 09/29/2017 CLINICAL DATA:  Sepsis. Foot ulcers mainly along the medial base of the big toe. EXAM: MRI OF THE LEFT FOOT WITHOUT CONTRAST TECHNIQUE: Multiplanar, multisequence MR imaging of the left foot was performed. No intravenous contrast was administered. COMPARISON:  Radiograph 09/26/2017  FINDINGS: Very limited examination.  The patient would not hold still. Motion degraded images with poor distal fat saturation make it very difficult to evaluate for osteomyelitis. I do not see any obvious T1 signal abnormality to suggest osteomyelitis. There is diffuse cellulitis and myofasciitis without obvious soft tissue abscess. There appears to be a skin blister along the lateral aspect of the foot near the fifth toe. IMPRESSION: Very limited examination but no obvious findings for septic arthritis or osteomyelitis. Diffuse cellulitis and myofasciitis. Electronically Signed   By: Marijo Sanes M.D.   On: 09/29/2017 16:44   Dg Chest Port 1 View  Result Date: 09/29/2017 CLINICAL DATA:  Dyspnea EXAM: PORTABLE CHEST 1 VIEW COMPARISON:  09/26/2017; 12/31/2012; 04/02/2009 FINDINGS: Grossly unchanged enlarged cardiac silhouette and mediastinal contours with atherosclerotic plaque within a tortuous and potentially ectatic thoracic aorta. Post median sternotomy and CABG. Pulmonary vasculature appears less distinct on the present examination with cephalization of flow. Suspected trace layering bilateral effusions. Worsening bibasilar heterogeneous/consolidative opacities, left greater than right. No pneumothorax. No acute osseus abnormalities. IMPRESSION: Findings most suggestive of pulmonary edema with suspected trace bilateral effusions and associated bibasilar opacities, atelectasis versus infiltrate. Electronically Signed   By: Sandi Mariscal M.D.   On: 09/29/2017 09:05   Dg Foot Complete Left  Result Date: 09/26/2017 CLINICAL DATA:  [place].  Pain.  Medial wound. EXAM: LEFT FOOT - COMPLETE 3+ VIEW COMPARISON:  None.  FINDINGS: There is no evidence of fracture or dislocation. There is no evidence of arthropathy or other focal bone abnormality. Soft tissues are unremarkable. IMPRESSION: Negative. Electronically Signed   By: Staci Righter M.D.   On: 09/26/2017 15:01    Assessment and Plan:   1. Acute combined CHF:  patient presented with sepsis 2/2 MSSA bacteremia and LLE cellulitis. He was given IVF at the time of admission and subsequently developed SOB and hypoxia 09/29/17. CXR revealed pulmonary edema. BNP elevated to 3317. EKG with LBBB (previously incomplete). He was started on IV lasix 40mg  BID with good UOP -1.3L in the last 24 hours but still net +2.8L this admission. Echo 09/27/17 with EF 25-30% with G1DD, and diffuse hypokinesis (previous echo 2011 with EF 50-55%). Last ischemic evaluation was a NST 2011 which was without ischemia. Possible he has had an ischemic event vs progression of disease with subsequent drop in EF. BP appears to be fairly well controlled outpatient so doubtful this is HTN related. Does not appear to have significant tachyarrhythmias either to contribute to his low EF. Family is not interested in invasive testing such as a LHC, nor would he be a good candidate given advanced age and dementia. Favor medical management. - Continue metoprolol XL - Continue diuresis with IV lasix 40mg  BID - still with crackles at lung bases on exam.  - Continue to monitor strict I&Os and daily weights - Monitor electrolytes closely and replete as needed: K>4; Mg >2 - Consider restarting home irbesartan if Cr improves to baseline 1.2  2. Sepsis 2/2 MSSA bacteremia: patient presented with increasing lethargy, found to have MSSA bacteremia of unclear source. XR L foot and lumbar spine negative for acute findings. MRI L foot with cellulitis/myofaciitis but no osteomyelitis or septic arthritis. Echo without evidence of endocarditis. After discussion with family, decision made to pursue IV ABX for 6 weeks in an effort to minimize procedures/test and not perform MRI L spine or TEE.  - Continue management per primary team/ ID  3. CAD s/p CABG 1998: patient has been without anginal complaints. Last ischemic evaluation was a NST 2011 which was without ischemia.  - Continue ASA and metoprolol - Not on statin therapy  due to age  63. HTN: BP intermittently elevated - Continue metoprolol XL - unable to titrate given bradycardia - Consider restarting home irbesartan if Cr improves to baseline 1.2  5. DM type 2: Last A1C 6.1 04/2017; at goal of <7 - Continue management per primary team  6. HLD: Last LDL 64 04/2017; at goal of <70 - No need for statin therapy  7. CKD stage III: presented with acute on chronic CKD with Cr 1.8; improved to 1.33 today.  - Continue to monitor with diuresis   For questions or updates, please contact [place] Please consult [email or web address] for contact info under Cardiology/STEMI.   Signed, Abigail Butts, PA-C  09/30/2017 1:54 PM [phone]  I have seen and examined the patient along with Abigail Butts, PA-C.  I have reviewed the chart, notes and new data.  I agree with PA's note.  Key new complaints: He believes that his breathing is substantially improved since yesterday and his family agrees.  He never complained of angina. Key examination changes: Only has fairly subtle signs of hypervolemia at this time.  He does not have any peripheral edema.  His jugular venous pulsations are up 5 or 6 cm.  No S3.  Has a split S2 due to intraventricular conduction delay.  Early audible early peaking systolic ejection murmur.  Does have some crackles in his bases but I believe the mostly clear with cough. Key new findings / data: ECG shows sinus rhythm with PACs and nonspecific intraventricular conduction delay most closely resembling a left bundle branch block.  As far as I can tell his baseline creatinine is around 1.2-1.3.   I personally reviewed his echocardiogram and I believe that the degree of left ventricular systolic dysfunction is not as bad as reported.  I estimated his left ventricular ejection fraction to be around 40% with inferior wall hypokinesis.  Interestingly, when his echo was performed on July 2, the Doppler pattern was not suggestive of volume overload.  He  weighed 174 pounds on that day and had not yet receive a large volume of intravenous fluids.  PLAN: At his last office visit with his primary care provider he weighed 178 pounds and on admission to the hospital he was 173-174 pounds.  His last recorded weight was 185 pounds yesterday. Similarly, by in/out balance he is roughly 3 L positive. Continue diuretics until his weight is equal to or less than 178 pounds. Continue to monitor renal function. Resume irbesartan prior to discharge.   The family tells me that he was previously on ARB/thiazide combo and the thiazide diuretic was stopped due to kidney problems.   Intolerant of beta-blockers due to bradycardia.   Try to avoid NSAIDs, especially long-acting agent such as the etodolac he was taking at home. Once he goes home, it would be useful her him to continue undergoing daily weight monitoring. The patient and his family expressed a preference for conservative approach to evaluation and treatment of his LV dysfunction and I am in agreement.  He already has an appointment with Dr. Percival Spanish in the [place] office on August 28.  Sanda Klein, MD, [place] [phone] 09/30/2017, 3:45 PM

## 2017-09-30 NOTE — Progress Notes (Signed)
CSW spoke with pt dtr to discuss SNF options.  Prefers [facility] as first choice then [facility].  Per MD likely won't ready over the weekend- CSW will continue to follow and plan for DC early next week  Jorge Ny, [place] Social Worker [phone]

## 2017-09-30 NOTE — Progress Notes (Signed)
PHARMACY CONSULT NOTE FOR:  OUTPATIENT  PARENTERAL ANTIBIOTIC THERAPY (OPAT)  Indication: MSSA bacteremia Regimen: Cefazolin 2 grams iv Q 12  End date: November 09, 2017  IV antibiotic discharge orders are pended. To discharging provider:  please sign these orders via discharge navigator,  Select New Orders & click on the button choice - Manage This Unsigned Work.     Thank you for allowing pharmacy to be a part of this patient's care.  Tad Moore 09/30/2017, 11:08 AM

## 2017-09-30 NOTE — Progress Notes (Signed)
[facility]                                                                              Patient Demographics  [name] [name], is a 84 y.o. male, DOB - 02-Feb-1919, SAY:[id number]  Admit date - 09/26/2017   Admitting Physician Vianne Bulls, MD  Outpatient Primary MD for the patient is Claretta Fraise, MD  Outpatient specialists:   LOS - 4  days   Medical records reviewed and are as summarized below:    Chief Complaint  Patient presents with  . Fall  . Skin Ulcer       Brief summary   [name] [name] [name] a 84 y.o.malewith medical history significant fordementia, hypertension, hypothyroidism, coronary artery disease, and type 2 diabetes mellitus, now presenting to the emergency department for evaluation of fatigue and low back pain. Patient lives at home with his wife who also has dementia. They have caregivers come up about 8 hours a day for assistance. He was noted to be sleeping most of the day for the past couple days. He had suffered a ground-level mechanical fall a few days ago and had been complaining of low back pain since then. He has not been complaining of abdominal pain and there has not been any diarrhea or vomiting reported.  Patient was admitted for further work-up.  Assessment & Plan    Principal Problem: Severe sepsis secondary to MSSA bacteremia, nonhealing neuropathic ulcers to the left foot -Unclear source, possibly due to left foot, UA negative for UTI. -2D echo showed EF of 25 to 30% but no obvious vegetations -Infectious disease was consulted, recommended continue IV Ancef -MRI of the left foot showed diffuse cellulitis and mild fasciitis but no septic arthritis or osteomyelitis.  Lumbar spine x-ray showed moderate to advanced degenerative disc and facet disease but no acute findings Needs TEE and MRI lumbar spine.  ID following closely, continue IV cefazolin  Active problems  Acute on chronic systolic and diastolic CHF exacerbation with  pulmonary edema, acute hypoxic respiratory failure -Feeling a lot better today, continue IV Lasix 40 mg every 12 hours - 2D echo 7/2 showed EF of 25 to 30%, with diffuse hypokinesis.  Patient's family requested cardiology, follows Dr. Percival Spanish, consult requested -Continue Toprol-XL, no ACEI or ARB secondary to renal insufficiency. -Follow strict I's and O's and daily weights, 3.2 L positive -Still wheezing, placed on duo nebs, Dulera, Solu-Medrol 60 mg IV every 12 hours  Acute encephalopathy superimposed on dementia -Continue Aricept, -Family requested skilled nursing facility at the time of discharge, social work consulted  Diabetes mellitus type 2, uncontrolled with hyperglycemia, NIDDM -Continue to hold Amaryl -CBGs expected to rise with Solu-Medrol, change to sliding scale insulin to moderate.  Essential hypertension -BP stable, continue Lasix, Toprol-XL  History of coronary artery disease -Continue aspirin, Toprol-XL -2D echo showed EF of 25 to 30% with grade 1 diastolic dysfunction, diffuse hypokinesis  Hypothyroidism -Continue Synthroid  Mild acute on chronic kidney disease stage III with lactic acidosis -Baseline creatinine 1.2-1.5 -Creatinine at the baseline  Code Status: DNR DVT Prophylaxis: Heparin subcu Family Communication: Discussed in detail with the patient, all  imaging results, lab results explained to the patient's  daughter and son-in-law at the bedside yesterday on 7/4   Disposition Plan:   Time Spent in minutes 35  minutes,   Procedures:  2D echo: EF 25 to 30% with grade 1 diastolic dysfunction, diffuse hypokinesis   Consultants:   Infectious disease   Antimicrobials:      Medications  Scheduled Meds: . aspirin EC  81 mg Oral Daily  . B-complex with vitamin C  1 tablet Oral QODAY  . donepezil  10 mg Oral QHS  . famotidine  20 mg Oral Daily  . furosemide  40 mg Intravenous Q12H  . heparin  5,000 Units Subcutaneous Q8H  . insulin aspart   0-15 Units Subcutaneous TID WC  . insulin aspart  0-5 Units Subcutaneous QHS  . ipratropium-albuterol  3 mL Nebulization Q4H  . levothyroxine  50 mcg Oral QAC breakfast  . methylPREDNISolone (SOLU-MEDROL) injection  60 mg Intravenous Q12H  . metoprolol succinate  50 mg Oral Daily  . mometasone-formoterol  2 puff Inhalation BID  . multivitamin with minerals  1 tablet Oral Daily  . potassium chloride  40 mEq Oral Daily  . sodium chloride flush  3 mL Intravenous Q12H   Continuous Infusions: .  ceFAZolin (ANCEF) IV 2 g (09/30/17 1050)   PRN Meds:.acetaminophen **OR** acetaminophen, bisacodyl, fentaNYL (SUBLIMAZE) injection, hydrALAZINE, HYDROcodone-acetaminophen, ondansetron **OR** ondansetron (ZOFRAN) IV, senna-docusate   Antibiotics   Anti-infectives (From admission, onward)   Start     Dose/Rate Route Frequency Ordered Stop   09/27/17 1200  vancomycin (VANCOCIN) IVPB 750 mg/150 ml premix  Status:  Discontinued     750 mg 150 mL/hr over 60 Minutes Intravenous Every 24 hours 09/26/17 2054 09/27/17 0735   09/27/17 1000  ceFAZolin (ANCEF) IVPB 2g/100 mL premix     2 g 200 mL/hr over 30 Minutes Intravenous Every 12 hours 09/27/17 0737     09/27/17 0000  ceFEPIme (MAXIPIME) 1 g in sodium chloride 0.9 % 100 mL IVPB  Status:  Discontinued     1 g 200 mL/hr over 30 Minutes Intravenous Every 24 hours 09/26/17 2054 09/27/17 0735   09/26/17 1815  vancomycin (VANCOCIN) IVPB 1000 mg/200 mL premix     1,000 mg 200 mL/hr over 60 Minutes Intravenous  Once 09/26/17 1813 09/26/17 2051   09/26/17 1815  piperacillin-tazobactam (ZOSYN) IVPB 3.375 g     3.375 g 100 mL/hr over 30 Minutes Intravenous  Once 09/26/17 1813 09/26/17 1934        Subjective:   [name] [name] was seen and examined today.  Alert and awake, feeling better, SOB improving, still wheezing, low-grade temp of 99.8 F.  Difficult to obtain review of system from the patient, has dementia.    Objective:   Vitals:   09/29/17 2144  09/30/17 0119 09/30/17 0420 09/30/17 0838  BP: (!) 147/79  (!) 151/69   Pulse: 83  69   Resp: 19  18   Temp: 98.7 F (37.1 C)  99.8 F (37.7 C)   TempSrc: Oral  Oral   SpO2: 95% 95% 98% 97%  Weight:      Height:        Intake/Output Summary (Last 24 hours) at 09/30/2017 1101 Last data filed at 09/30/2017 0900 Gross per 24 hour  Intake 350 ml  Output 1525 ml  Net -1175 ml     Wt Readings from Last 3 Encounters:  09/29/17 83.9 kg (185 lb)  09/13/17 78.6 kg (173 lb 4  oz)  05/03/17 81.6 kg (180 lb)     Exam   General: Alert and awake, NAD  Eyes:   HEENT:    Cardiovascular: S1 S2 clear, Regular rate and rhythm. No pedal edema b/l  Respiratory: Bilateral expiratory wheezing, improving from yesterday  Gastrointestinal: Soft, nontender, nondistended, + bowel sounds  Ext: no pedal edema bilaterally  Neuro: moving all 4 extremities spontaneously  Musculoskeletal: No digital cyanosis, clubbing  Skin: Left foot great toe ulcer, left fifth metatarsal ulcer, dry  Psych: dementia   Data Reviewed:  I have personally reviewed following labs and imaging studies  Micro Results Recent Results (from the past 240 hour(s))  Urine culture     Status: Abnormal   Collection Time: 09/26/17  5:32 PM  Result Value Ref Range Status   Specimen Description URINE, CLEAN CATCH  Final   Special Requests NONE  Final   Culture (A)  Final    <10,000 COLONIES/mL INSIGNIFICANT GROWTH Performed at [facility], [address], [place], [place] [address]    Report Status 09/27/2017 FINAL  Final  Blood Culture (routine x 2)     Status: Abnormal   Collection Time: 09/26/17  6:41 PM  Result Value Ref Range Status   Specimen Description BLOOD RIGHT ANTECUBITAL  Final   Special Requests   Final    BOTTLES DRAWN AEROBIC AND ANAEROBIC Blood Culture results may not be optimal due to an excessive volume of blood received in culture bottles   Culture  Setup Time   Final    IN BOTH AEROBIC  AND ANAEROBIC BOTTLES GRAM POSITIVE COCCI CRITICAL RESULT CALLED TO, READ BACK BY AND VERIFIED WITH: C BENDER PHARMD 09/27/17 3151 JDW Performed at [facility], [place] [address] [place], [place] [address]    Culture STAPHYLOCOCCUS AUREUS (A)  Final   Report Status 09/29/2017 FINAL  Final   Organism ID, Bacteria STAPHYLOCOCCUS AUREUS  Final      Susceptibility   Staphylococcus aureus - MIC*    CIPROFLOXACIN <=0.5 SENSITIVE Sensitive     ERYTHROMYCIN <=0.25 SENSITIVE Sensitive     GENTAMICIN <=0.5 SENSITIVE Sensitive     OXACILLIN 0.5 SENSITIVE Sensitive     TETRACYCLINE >=16 RESISTANT Resistant     VANCOMYCIN 1 SENSITIVE Sensitive     TRIMETH/SULFA <=10 SENSITIVE Sensitive     CLINDAMYCIN <=0.25 SENSITIVE Sensitive     RIFAMPIN <=0.5 SENSITIVE Sensitive     Inducible Clindamycin NEGATIVE Sensitive     * STAPHYLOCOCCUS AUREUS  Blood Culture ID Panel (Reflexed)     Status: Abnormal   Collection Time: 09/26/17  6:41 PM  Result Value Ref Range Status   Enterococcus species NOT DETECTED NOT DETECTED Final   Listeria monocytogenes NOT DETECTED NOT DETECTED Final   Staphylococcus species DETECTED (A) NOT DETECTED Final    Comment: CRITICAL RESULT CALLED TO, READ BACK BY AND VERIFIED WITH: C BENDER PHARMD 09/27/17 0721 JDW    Staphylococcus aureus DETECTED (A) NOT DETECTED Final    Comment: Methicillin (oxacillin) susceptible Staphylococcus aureus (MSSA). Preferred therapy is anti staphylococcal beta lactam antibiotic (Cefazolin or Nafcillin), unless clinically contraindicated. CRITICAL RESULT CALLED TO, READ BACK BY AND VERIFIED WITH: C BENDER PHARMD 09/27/17 0721 JDW    Methicillin resistance NOT DETECTED NOT DETECTED Final   Streptococcus species NOT DETECTED NOT DETECTED Final   Streptococcus agalactiae NOT DETECTED NOT DETECTED Final   Streptococcus pneumoniae NOT DETECTED NOT DETECTED Final   Streptococcus pyogenes NOT DETECTED NOT DETECTED Final  Acinetobacter baumannii NOT  DETECTED NOT DETECTED Final   Enterobacteriaceae species NOT DETECTED NOT DETECTED Final   Enterobacter cloacae complex NOT DETECTED NOT DETECTED Final   Escherichia coli NOT DETECTED NOT DETECTED Final   Klebsiella oxytoca NOT DETECTED NOT DETECTED Final   Klebsiella pneumoniae NOT DETECTED NOT DETECTED Final   Proteus species NOT DETECTED NOT DETECTED Final   Serratia marcescens NOT DETECTED NOT DETECTED Final   Haemophilus influenzae NOT DETECTED NOT DETECTED Final   Neisseria meningitidis NOT DETECTED NOT DETECTED Final   Pseudomonas aeruginosa NOT DETECTED NOT DETECTED Final   Candida albicans NOT DETECTED NOT DETECTED Final   Candida glabrata NOT DETECTED NOT DETECTED Final   Candida krusei NOT DETECTED NOT DETECTED Final   Candida parapsilosis NOT DETECTED NOT DETECTED Final   Candida tropicalis NOT DETECTED NOT DETECTED Final  Culture, blood (routine x 2)     Status: None (Preliminary result)   Collection Time: 09/28/17  4:35 AM  Result Value Ref Range Status   Specimen Description BLOOD RIGHT ANTECUBITAL  Final   Special Requests   Final    BOTTLES DRAWN AEROBIC ONLY Blood Culture adequate volume   Culture   Final    NO GROWTH 1 DAY Performed at [facility], [place] [address], [place], [place] [address]    Report Status PENDING  Incomplete  Culture, blood (routine x 2)     Status: None (Preliminary result)   Collection Time: 09/28/17  4:42 AM  Result Value Ref Range Status   Specimen Description BLOOD LEFT HAND  Final   Special Requests   Final    BOTTLES DRAWN AEROBIC ONLY Blood Culture adequate volume   Culture   Final    NO GROWTH 1 DAY Performed at [facility], [place] [address] [place], [place] [address]    Report Status PENDING  Incomplete    Radiology Reports Ct Abdomen Pelvis Wo Contrast  Result Date: 09/26/2017 CLINICAL DATA:  Fall with low back pain EXAM: CT ABDOMEN AND PELVIS WITHOUT CONTRAST TECHNIQUE: Multidetector CT imaging of the abdomen and  pelvis was performed following the standard protocol without IV contrast. COMPARISON:  01/10/2013 CT FINDINGS: Lower chest: Lung bases demonstrate small pleural effusions. No consolidation. Cardiomegaly. Coronary vascular calcification. Hepatobiliary: No focal liver abnormality is seen. No gallstones, gallbladder wall thickening, or biliary dilatation. Pancreas: Unremarkable. No pancreatic ductal dilatation or surrounding inflammatory changes. Spleen: Normal in size without focal abnormality. Adrenals/Urinary Tract: Adrenal glands are unremarkable. Kidneys are normal, without renal calculi, focal lesion, or hydronephrosis. Bladder is unremarkable. Stomach/Bowel: Stomach is within normal limits. Interval appendectomy. No evidence of bowel wall thickening, distention, or inflammatory changes. Vascular/Lymphatic: Moderate aortic atherosclerosis. No aneurysmal dilatation. No significantly enlarged lymph nodes Reproductive: Prostate is unremarkable. Other: Negative for free air or free fluid. Small fat in the inguinal canals. Musculoskeletal: Scoliosis of the spine. Trace retrolisthesis L1 on L2 and L2 on L3. Multiple level degenerative change. No acute osseous abnormality. IMPRESSION: 1. No CT evidence for acute intra-abdominal or pelvic abnormality. 2. Cardiomegaly.  Small pleural effusions. Electronically Signed   By: Donavan Foil M.D.   On: 09/26/2017 19:42   Dg Chest 2 View  Result Date: 09/26/2017 CLINICAL DATA:  [place] 2 days ago at home, fever, history coronary disease post CABG, hypertension, diabetes mellitus, diabetic neuropathy and medial LEFT foot wound EXAM: CHEST - 2 VIEW COMPARISON:  01/10/2013 FINDINGS: Enlargement of cardiac silhouette post CABG. Mediastinal contours and pulmonary vascularity normal. Atherosclerotic calcification aorta.  Decreased lung volumes with mild bibasilar atelectasis. Remaining lungs clear. No pleural effusion or pneumothorax. Bones demineralized. IMPRESSION: Enlargement of  cardiac silhouette post CABG. Bibasilar atelectasis. Electronically Signed   By: Lavonia Dana M.D.   On: 09/26/2017 15:02   Dg Lumbar Spine Complete  Result Date: 09/29/2017 CLINICAL DATA:  Bacteremia.  Back pain EXAM: LUMBAR SPINE - COMPLETE 4+ VIEW COMPARISON:  CT abdomen and pelvis 09/26/2017 FINDINGS: Moderate to advanced diffuse degenerative disc disease, most pronounced at L1-2 and L2-3 with disc space narrowing, spurring, endplate sclerosis and vacuum disc. Diffuse degenerative facet disease, most pronounced in the lower lumbar spine. Slight anterolisthesis of L4 on L5 related to facet disease. No fracture. SI joints are symmetric and unremarkable. IMPRESSION: Moderate to advanced degenerative disc and facet disease. No acute findings. Electronically Signed   By: Rolm Baptise M.D.   On: 09/29/2017 15:59   Mr Foot Left Wo Contrast  Result Date: 09/29/2017 CLINICAL DATA:  Sepsis. Foot ulcers mainly along the medial base of the big toe. EXAM: MRI OF THE LEFT FOOT WITHOUT CONTRAST TECHNIQUE: Multiplanar, multisequence MR imaging of the left foot was performed. No intravenous contrast was administered. COMPARISON:  Radiograph 09/26/2017 FINDINGS: Very limited examination.  The patient would not hold still. Motion degraded images with poor distal fat saturation make it very difficult to evaluate for osteomyelitis. I do not see any obvious T1 signal abnormality to suggest osteomyelitis. There is diffuse cellulitis and myofasciitis without obvious soft tissue abscess. There appears to be a skin blister along the lateral aspect of the foot near the fifth toe. IMPRESSION: Very limited examination but no obvious findings for septic arthritis or osteomyelitis. Diffuse cellulitis and myofasciitis. Electronically Signed   By: Marijo Sanes M.D.   On: 09/29/2017 16:44   Dg Chest Port 1 View  Result Date: 09/29/2017 CLINICAL DATA:  Dyspnea EXAM: PORTABLE CHEST 1 VIEW COMPARISON:  09/26/2017; 12/31/2012; 04/02/2009  FINDINGS: Grossly unchanged enlarged cardiac silhouette and mediastinal contours with atherosclerotic plaque within a tortuous and potentially ectatic thoracic aorta. Post median sternotomy and CABG. Pulmonary vasculature appears less distinct on the present examination with cephalization of flow. Suspected trace layering bilateral effusions. Worsening bibasilar heterogeneous/consolidative opacities, left greater than right. No pneumothorax. No acute osseus abnormalities. IMPRESSION: Findings most suggestive of pulmonary edema with suspected trace bilateral effusions and associated bibasilar opacities, atelectasis versus infiltrate. Electronically Signed   By: Sandi Mariscal M.D.   On: 09/29/2017 09:05   Dg Foot Complete Left  Result Date: 09/26/2017 CLINICAL DATA:  [place].  Pain.  Medial wound. EXAM: LEFT FOOT - COMPLETE 3+ VIEW COMPARISON:  None. FINDINGS: There is no evidence of fracture or dislocation. There is no evidence of arthropathy or other focal bone abnormality. Soft tissues are unremarkable. IMPRESSION: Negative. Electronically Signed   By: Staci Righter M.D.   On: 09/26/2017 15:01    Lab Data:  CBC: Recent Labs  Lab 09/26/17 1551 09/27/17 0042 09/28/17 0435 09/29/17 0942 09/30/17 0459  WBC 23.3* 18.9* 16.2* 18.3* 13.3*  NEUTROABS 21.6* 17.7*  --   --   --   HGB 11.0* 9.7* 9.6* 10.4* 9.8*  HCT 35.2* 29.8* 29.6* 32.2* 28.7*  MCV 109.7* 103.5* 103.9* 102.2* 98.6  PLT 175 177 180 274 532   Basic Metabolic Panel: Recent Labs  Lab 09/26/17 1551 09/27/17 0042 09/28/17 0435 09/29/17 0942 09/30/17 0459  NA 139 137 135 137 135  K 4.7 4.2 3.9 3.9 3.1*  CL 102 106 106 108 106  CO2  19* 17* 21* 17* 22  GLUCOSE 207* 262* 145* 187* 152*  BUN 31* 31* 37* 36* 37*  CREATININE 1.82* 1.69* 1.44* 1.32* 1.33*  CALCIUM 8.5* 7.7* 7.7* 8.0* 7.8*   GFR: Estimated Creatinine Clearance: 32.5 mL/min (A) (by C-G formula based on SCr of 1.33 mg/dL (H)). Liver Function Tests: Recent Labs  Lab  09/26/17 1551 09/27/17 0042  AST 32 29  ALT 26 23  ALKPHOS 66 55  BILITOT 1.8* 1.4*  PROT 5.9* 5.3*  ALBUMIN 2.9* 2.5*   No results for input(s): LIPASE, AMYLASE in the last 168 hours. No results for input(s): AMMONIA in the last 168 hours. Coagulation Profile: Recent Labs  Lab 09/26/17 2212  INR 1.56   Cardiac Enzymes: Recent Labs  Lab 09/26/17 2212  CKTOTAL 113   BNP (last 3 results) No results for input(s): PROBNP in the last 8760 hours. HbA1C: No results for input(s): HGBA1C in the last 72 hours. CBG: Recent Labs  Lab 09/29/17 0757 09/29/17 1158 09/29/17 1712 09/29/17 2131 09/30/17 0750  GLUCAP 150* 181* 150* 151* 144*   Lipid Profile: No results for input(s): CHOL, HDL, LDLCALC, TRIG, CHOLHDL, LDLDIRECT in the last 72 hours. Thyroid Function Tests: No results for input(s): TSH, T4TOTAL, FREET4, T3FREE, THYROIDAB in the last 72 hours. Anemia Panel: No results for input(s): VITAMINB12, FOLATE, FERRITIN, TIBC, IRON, RETICCTPCT in the last 72 hours. Urine analysis:    Component Value Date/Time   COLORURINE AMBER (A) 09/26/2017 1732   APPEARANCEUR CLEAR 09/26/2017 1732   APPEARANCEUR Clear 07/21/2016 1617   LABSPEC 1.020 09/26/2017 1732   PHURINE 5.0 09/26/2017 1732   GLUCOSEU NEGATIVE 09/26/2017 1732   HGBUR NEGATIVE 09/26/2017 1732   BILIRUBINUR SMALL (A) 09/26/2017 1732   BILIRUBINUR Positive (A) 07/21/2016 1617   KETONESUR 5 (A) 09/26/2017 1732   PROTEINUR 30 (A) 09/26/2017 1732   UROBILINOGEN negative 03/12/2015 1619   NITRITE NEGATIVE 09/26/2017 1732   LEUKOCYTESUR NEGATIVE 09/26/2017 1732   LEUKOCYTESUR Negative 07/21/2016 1617     Kensi Karr M.D. [facility] 09/30/2017, 11:01 AM  Pager: [phone] Between 7am to 7pm - call Pager - [phone]  After 7pm go to [email or web address] - password [email or web address]  Call night coverage person covering after 7pm

## 2017-09-30 NOTE — Progress Notes (Signed)
[facility]  Date of Admission:  09/26/2017     Total days of antibiotics 4         ASSESSMENT/PLAN  Mr. Pilot is a 84 y/o male with previous medical history of Alzheimer's disease, CAD, and diabetes who experienced a mechanical fall and was admitted with back pain and increasing fatigue/lethargy. Found to have MSSA bacteremia of unclear source.   On Day 4 of antimicrobial therapy with Cefazolin and is tolerating medication with no adverse side effects. Repeat blood cultures remain negative.  MRI completed with no obvious evidence of osteomyelitis or septic joint, however cellulitis and myositis is present which remains concerning. Lumbar x-rays with degenerative disc disease. He would still need an MRI of the lumbar spine to confirm no evidence of disciitis or osteomyelitis in order to change treatment plan. After discussion with the family they are in agreement to treat for 6 weeks of IV therapy. Current disposition is for SNF  1. Continue current dosage of Cefazolin.  [place] for PICC line today if cultures remain negative.  3. OPAT orders written.   Dr. Baxter Flattery ([phone]) will be available over the weekend as needed. Dr. Megan Salon with return on Monday.    Diagnosis: MSSA bacteremia  Culture Result: MSSA  No Known Allergies  OPAT Orders Discharge antibiotics: Cefazolin Per pharmacy protocol   Duration:  6 weeks   End Date:  11/11/17  [facility] Care Per Protocol:  Labs weekly while on IV antibiotics: _X_ CBC with differential __ BMP _X_ CMP __ CRP __ ESR __ Vancomycin trough  _X_ Please pull PIC at completion of IV antibiotics __ Please leave PIC in place until doctor has seen patient or been notified  Fax weekly labs to [phone]  Clinic Follow Up Appt:  4 weeks with Dr. Megan Salon or Terri Piedra, NP   Principal Problem:   MSSA bacteremia Active Problems:   HTN (hypertension)   CAD (coronary artery disease)    Hypothyroidism   Late onset Alzheimer's disease without behavioral disturbance   SIRS (systemic inflammatory response syndrome) ([facility])   Diabetes mellitus type II, non insulin dependent (HCC)   Macrocytic anemia   Back pain   Pressure injury of deep tissue of left foot   CKD (chronic kidney disease), stage III ([place])   . aspirin EC  81 mg Oral Daily  . B-complex with vitamin C  1 tablet Oral QODAY  . donepezil  10 mg Oral QHS  . famotidine  20 mg Oral Daily  . furosemide  40 mg Intravenous Q12H  . heparin  5,000 Units Subcutaneous Q8H  . insulin aspart  0-15 Units Subcutaneous TID WC  . insulin aspart  0-5 Units Subcutaneous QHS  . ipratropium-albuterol  3 mL Nebulization Q4H  . levothyroxine  50 mcg Oral QAC breakfast  . methylPREDNISolone (SOLU-MEDROL) injection  60 mg Intravenous Q12H  . metoprolol succinate  50 mg Oral Daily  . mometasone-formoterol  2 puff Inhalation BID  . multivitamin with minerals  1 tablet Oral Daily  . potassium chloride  40 mEq Oral Daily  . sodium chloride flush  3 mL Intravenous Q12H    SUBJECTIVE:  Afebrile overnight. WBC count of 13.3. Repeat blood cultures with no growth to date. MRI with diffuse cellulitis and myositis of the foot with no obvious septic arthritis or osteomyelitis. Lumbar spine x-rays with moderate to advanced degenerative disc disease and no evidence of disciitis or osteomyelitis.     No Known Allergies  Review of Systems: Review of Systems  Unable to perform ROS: Dementia      OBJECTIVE: Vitals:   09/29/17 2144 09/30/17 0119 09/30/17 0420 09/30/17 0838  BP: (!) 147/79  (!) 151/69   Pulse: 83  69   Resp: 19  18   Temp: 98.7 F (37.1 C)  99.8 F (37.7 C)   TempSrc: Oral  Oral   SpO2: 95% 95% 98% 97%  Weight:      Height:       Body mass index is 27.32 kg/m.  Physical Exam  Constitutional: He is oriented to person, place, and time. He appears well-developed and well-nourished. No distress.  Cardiovascular:  Normal rate, regular rhythm, normal heart sounds and intact distal pulses.  Pulmonary/Chest: Effort normal and breath sounds normal.  Neurological: He is alert and oriented to person, place, and time.  Skin: Skin is warm and dry.  Psychiatric: He has a normal mood and affect. His behavior is normal. Judgment and thought content normal.    Lab Results Lab Results  Component Value Date   WBC 13.3 (H) 09/30/2017   HGB 9.8 (L) 09/30/2017   HCT 28.7 (L) 09/30/2017   MCV 98.6 09/30/2017   PLT 249 09/30/2017    Lab Results  Component Value Date   CREATININE 1.33 (H) 09/30/2017   BUN 37 (H) 09/30/2017   NA 135 09/30/2017   K 3.1 (L) 09/30/2017   CL 106 09/30/2017   CO2 22 09/30/2017    Lab Results  Component Value Date   ALT 23 09/27/2017   AST 29 09/27/2017   ALKPHOS 55 09/27/2017   BILITOT 1.4 (H) 09/27/2017     Microbiology: Recent Results (from the past 240 hour(s))  Urine culture     Status: Abnormal   Collection Time: 09/26/17  5:32 PM  Result Value Ref Range Status   Specimen Description URINE, CLEAN CATCH  Final   Special Requests NONE  Final   Culture (A)  Final    <10,000 COLONIES/mL INSIGNIFICANT GROWTH Performed at [facility], [address] [place], [place] [address]    Report Status 09/27/2017 FINAL  Final  Blood Culture (routine x 2)     Status: Abnormal   Collection Time: 09/26/17  6:41 PM  Result Value Ref Range Status   Specimen Description BLOOD RIGHT ANTECUBITAL  Final   Special Requests   Final    BOTTLES DRAWN AEROBIC AND ANAEROBIC Blood Culture results may not be optimal due to an excessive volume of blood received in culture bottles   Culture  Setup Time   Final    IN BOTH AEROBIC AND ANAEROBIC BOTTLES GRAM POSITIVE COCCI CRITICAL RESULT CALLED TO, READ BACK BY AND VERIFIED WITH: C BENDER PHARMD 09/27/17 2119 JDW Performed at [facility], [place] [address], [place], [place] [address]    Culture STAPHYLOCOCCUS AUREUS (A)  Final    Report Status 09/29/2017 FINAL  Final   Organism ID, Bacteria STAPHYLOCOCCUS AUREUS  Final      Susceptibility   Staphylococcus aureus - MIC*    CIPROFLOXACIN <=0.5 SENSITIVE Sensitive     ERYTHROMYCIN <=0.25 SENSITIVE Sensitive     GENTAMICIN <=0.5 SENSITIVE Sensitive     OXACILLIN 0.5 SENSITIVE Sensitive     TETRACYCLINE >=16 RESISTANT Resistant     VANCOMYCIN 1 SENSITIVE Sensitive     TRIMETH/SULFA <=10 SENSITIVE Sensitive     CLINDAMYCIN <=0.25 SENSITIVE Sensitive     RIFAMPIN <=0.5 SENSITIVE Sensitive  Inducible Clindamycin NEGATIVE Sensitive     * STAPHYLOCOCCUS AUREUS  Blood Culture ID Panel (Reflexed)     Status: Abnormal   Collection Time: 09/26/17  6:41 PM  Result Value Ref Range Status   Enterococcus species NOT DETECTED NOT DETECTED Final   Listeria monocytogenes NOT DETECTED NOT DETECTED Final   Staphylococcus species DETECTED (A) NOT DETECTED Final    Comment: CRITICAL RESULT CALLED TO, READ BACK BY AND VERIFIED WITH: C BENDER PHARMD 09/27/17 0721 JDW    Staphylococcus aureus DETECTED (A) NOT DETECTED Final    Comment: Methicillin (oxacillin) susceptible Staphylococcus aureus (MSSA). Preferred therapy is anti staphylococcal beta lactam antibiotic (Cefazolin or Nafcillin), unless clinically contraindicated. CRITICAL RESULT CALLED TO, READ BACK BY AND VERIFIED WITH: C BENDER PHARMD 09/27/17 0721 JDW    Methicillin resistance NOT DETECTED NOT DETECTED Final   Streptococcus species NOT DETECTED NOT DETECTED Final   Streptococcus agalactiae NOT DETECTED NOT DETECTED Final   Streptococcus pneumoniae NOT DETECTED NOT DETECTED Final   Streptococcus pyogenes NOT DETECTED NOT DETECTED Final   Acinetobacter baumannii NOT DETECTED NOT DETECTED Final   Enterobacteriaceae species NOT DETECTED NOT DETECTED Final   Enterobacter cloacae complex NOT DETECTED NOT DETECTED Final   Escherichia coli NOT DETECTED NOT DETECTED Final   Klebsiella oxytoca NOT DETECTED NOT DETECTED Final    Klebsiella pneumoniae NOT DETECTED NOT DETECTED Final   Proteus species NOT DETECTED NOT DETECTED Final   Serratia marcescens NOT DETECTED NOT DETECTED Final   Haemophilus influenzae NOT DETECTED NOT DETECTED Final   Neisseria meningitidis NOT DETECTED NOT DETECTED Final   Pseudomonas aeruginosa NOT DETECTED NOT DETECTED Final   Candida albicans NOT DETECTED NOT DETECTED Final   Candida glabrata NOT DETECTED NOT DETECTED Final   Candida krusei NOT DETECTED NOT DETECTED Final   Candida parapsilosis NOT DETECTED NOT DETECTED Final   Candida tropicalis NOT DETECTED NOT DETECTED Final  Culture, blood (routine x 2)     Status: None (Preliminary result)   Collection Time: 09/28/17  4:35 AM  Result Value Ref Range Status   Specimen Description BLOOD RIGHT ANTECUBITAL  Final   Special Requests   Final    BOTTLES DRAWN AEROBIC ONLY Blood Culture adequate volume   Culture   Final    NO GROWTH 1 DAY Performed at [facility], [address] [place], [place] [address]    Report Status PENDING  Incomplete  Culture, blood (routine x 2)     Status: None (Preliminary result)   Collection Time: 09/28/17  4:42 AM  Result Value Ref Range Status   Specimen Description BLOOD LEFT HAND  Final   Special Requests   Final    BOTTLES DRAWN AEROBIC ONLY Blood Culture adequate volume   Culture   Final    NO GROWTH 1 DAY Performed at [facility], [place] [address] [place], [place] [address]    Report Status PENDING  Incomplete     Terri Piedra, [place] for [place] Pager  09/30/2017  9:06 AM

## 2017-10-01 ENCOUNTER — Inpatient Hospital Stay: Payer: Self-pay

## 2017-10-01 DIAGNOSIS — I5023 Acute on chronic systolic (congestive) heart failure: Secondary | ICD-10-CM

## 2017-10-01 LAB — CBC
HCT: 32.7 % — ABNORMAL LOW (ref 39.0–52.0)
Hemoglobin: 10.8 g/dL — ABNORMAL LOW (ref 13.0–17.0)
MCH: 33.5 pg (ref 26.0–34.0)
MCHC: 33 g/dL (ref 30.0–36.0)
MCV: 101.6 fL — AB (ref 78.0–100.0)
PLATELETS: 284 10*3/uL (ref 150–400)
RBC: 3.22 MIL/uL — ABNORMAL LOW (ref 4.22–5.81)
RDW: 13.3 % (ref 11.5–15.5)
WBC: 11 10*3/uL — AB (ref 4.0–10.5)

## 2017-10-01 LAB — GLUCOSE, CAPILLARY
GLUCOSE-CAPILLARY: 260 mg/dL — AB (ref 70–99)
GLUCOSE-CAPILLARY: 225 mg/dL — AB (ref 70–99)
Glucose-Capillary: 236 mg/dL — ABNORMAL HIGH (ref 70–99)
Glucose-Capillary: 251 mg/dL — ABNORMAL HIGH (ref 70–99)

## 2017-10-01 LAB — BASIC METABOLIC PANEL
ANION GAP: 12 (ref 5–15)
BUN: 40 mg/dL — ABNORMAL HIGH (ref 8–23)
CHLORIDE: 103 mmol/L (ref 98–111)
CO2: 22 mmol/L (ref 22–32)
Calcium: 8 mg/dL — ABNORMAL LOW (ref 8.9–10.3)
Creatinine, Ser: 1.36 mg/dL — ABNORMAL HIGH (ref 0.61–1.24)
GFR calc Af Amer: 49 mL/min — ABNORMAL LOW (ref >60)
GFR, EST NON AFRICAN AMERICAN: 42 mL/min — AB (ref >60)
GLUCOSE: 227 mg/dL — AB (ref 70–99)
POTASSIUM: 3.1 mmol/L — AB (ref 3.5–5.1)
Sodium: 137 mmol/L (ref 135–145)

## 2017-10-01 MED ORDER — METHYLPREDNISOLONE SODIUM SUCC 40 MG IJ SOLR
40.0000 mg | Freq: Two times a day (BID) | INTRAMUSCULAR | Status: DC
  Administered 2017-10-01 – 2017-10-02 (×2): 40 mg via INTRAVENOUS
  Filled 2017-10-01 (×2): qty 1

## 2017-10-01 NOTE — Progress Notes (Signed)
[facility]                                                                              Patient Demographics  [name] [name], is a 84 y.o. male, DOB - [birth year], WVP:[id number]  Admit date - 09/26/2017   Admitting Physician Vianne Bulls, MD  Outpatient Primary MD for the patient is Claretta Fraise, MD  Outpatient specialists:   LOS - 5  days   Medical records reviewed and are as summarized below:    Chief Complaint  Patient presents with  . Fall  . Skin Ulcer       Brief summary   [name] Shon [name] a 84 y.o.malewith medical history significant fordementia, hypertension, hypothyroidism, coronary artery disease, and type 2 diabetes mellitus, now presenting to the emergency department for evaluation of fatigue and low back pain. Patient lives at home with his wife who also has dementia. They have caregivers come up about 8 hours a day for assistance. He was noted to be sleeping most of the day for the past couple days. He had suffered a ground-level mechanical fall a few days ago and had been complaining of low back pain since then. He has not been complaining of abdominal pain and there has not been any diarrhea or vomiting reported.  Patient was admitted for further work-up.  Assessment & Plan    Principal Problem: Severe sepsis secondary to MSSA bacteremia, nonhealing neuropathic ulcers to the left foot -Unclear source, possibly due to left foot, UA negative for UTI. -2D echo showed EF of 25 to 30% but no obvious vegetations -MRI of the left foot showed diffuse cellulitis and mild fasciitis but no septic arthritis or osteomyelitis.  Lumbar spine x-ray showed moderate to advanced degenerative disc and facet disease but no acute findings -ID following, family is not interested in invasive testing, 2D echo did not show acute endocarditis.  Decision has been made to pursue IV antibiotics for 6 weeks as family has declined MRI of the spine or TEE. -Will place  PICC line today  Active problems  Acute on chronic systolic and diastolic CHF exacerbation with pulmonary edema, acute hypoxic respiratory failure -Feeling a lot better today, continue IV Lasix 40 mg every 12 hours - 2D echo 7/2 showed EF of 25 to 30%, with diffuse hypokinesis.  Patient's family requested cardiology, follows Dr. Percival Spanish, consult requested -Continue Toprol-XL, no ACEI or ARB secondary to renal insufficiency. -Wheezing much improved, continue IV Lasix for diuresis Taper IV Solu-Medrol   Acute encephalopathy superimposed on dementia -Continue Aricept, -Family requested skilled nursing facility at the time of discharge, social work consulted  Diabetes mellitus type 2, uncontrolled with hyperglycemia, NIDDM -Continue to hold Amaryl -CBGs expected to rise with Solu-Medrol, change to sliding scale insulin to moderate.  Essential hypertension -BP stable, continue Lasix, Toprol-XL  History of coronary artery disease -Continue aspirin, Toprol-XL -2D echo showed EF of 25 to 30% with grade 1 diastolic dysfunction, diffuse hypokinesis  Hypothyroidism -Continue Synthroid  Mild acute on chronic kidney disease stage III with lactic acidosis -Baseline creatinine 1.2-1.5 -Creatinine at the baseline  Code Status: DNR DVT Prophylaxis: Heparin subcu  Family Communication: No family member in the room  Disposition Plan:   Time Spent in minutes 25  minutes,   Procedures:  2D echo: EF 25 to 30% with grade 1 diastolic dysfunction, diffuse hypokinesis   Consultants:   Infectious disease   Antimicrobials:      Medications  Scheduled Meds: . aspirin EC  81 mg Oral Daily  . B-complex with vitamin C  1 tablet Oral QODAY  . donepezil  10 mg Oral QHS  . famotidine  20 mg Oral Daily  . furosemide  40 mg Intravenous Q12H  . heparin  5,000 Units Subcutaneous Q8H  . insulin aspart  0-15 Units Subcutaneous TID WC  . insulin aspart  0-5 Units Subcutaneous QHS  .  ipratropium-albuterol  3 mL Nebulization TID  . levothyroxine  50 mcg Oral QAC breakfast  . methylPREDNISolone (SOLU-MEDROL) injection  60 mg Intravenous Q12H  . metoprolol succinate  50 mg Oral Daily  . mometasone-formoterol  2 puff Inhalation BID  . multivitamin with minerals  1 tablet Oral Daily  . potassium chloride  40 mEq Oral Daily  . sodium chloride flush  3 mL Intravenous Q12H   Continuous Infusions: .  ceFAZolin (ANCEF) IV 2 g (10/01/17 1112)   PRN Meds:.acetaminophen **OR** acetaminophen, bisacodyl, fentaNYL (SUBLIMAZE) injection, hydrALAZINE, HYDROcodone-acetaminophen, ondansetron **OR** ondansetron (ZOFRAN) IV, senna-docusate   Antibiotics   Anti-infectives (From admission, onward)   Start     Dose/Rate Route Frequency Ordered Stop   09/27/17 1200  vancomycin (VANCOCIN) IVPB 750 mg/150 ml premix  Status:  Discontinued     750 mg 150 mL/hr over 60 Minutes Intravenous Every 24 hours 09/26/17 2054 09/27/17 0735   09/27/17 1000  ceFAZolin (ANCEF) IVPB 2g/100 mL premix     2 g 200 mL/hr over 30 Minutes Intravenous Every 12 hours 09/27/17 0737     09/27/17 0000  ceFEPIme (MAXIPIME) 1 g in sodium chloride 0.9 % 100 mL IVPB  Status:  Discontinued     1 g 200 mL/hr over 30 Minutes Intravenous Every 24 hours 09/26/17 2054 09/27/17 0735   09/26/17 1815  vancomycin (VANCOCIN) IVPB 1000 mg/200 mL premix     1,000 mg 200 mL/hr over 60 Minutes Intravenous  Once 09/26/17 1813 09/26/17 2051   09/26/17 1815  piperacillin-tazobactam (ZOSYN) IVPB 3.375 g     3.375 g 100 mL/hr over 30 Minutes Intravenous  Once 09/26/17 1813 09/26/17 1934        Subjective:   [name] [name] was seen and examined today.  Alert and awake, oriented to self, appears close to his baseline.  No fevers or chills, wheezing improved.   Objective:   Vitals:   09/30/17 2156 10/01/17 0500 10/01/17 0537 10/01/17 0759  BP: 131/71  (!) 105/40   Pulse: 85  (!) 57   Resp: 20  18   Temp: 99.2 F (37.3 C)  98.6  F (37 C)   TempSrc: Oral  Oral   SpO2: 96%  97% 97%  Weight:  78.9 kg (174 lb)    Height:        Intake/Output Summary (Last 24 hours) at 10/01/2017 1219 Last data filed at 10/01/2017 0900 Gross per 24 hour  Intake 950 ml  Output -  Net 950 ml     Wt Readings from Last 3 Encounters:  10/01/17 78.9 kg (174 lb)  09/13/17 78.6 kg (173 lb 4 oz)  05/03/17 81.6 kg (180 lb)     Exam   General: Alert and oriented  x 1, NAD  Eyes:   HEENT:   Cardiovascular: S1 S2 auscultated,  Regular rate and rhythm. No pedal edema b/l  Respiratory: No significant wheezing today, decreased breath sound at the bases  Gastrointestinal: Soft, nontender, nondistended, + bowel sounds  Ext: no pedal edema bilaterally  Neuro: moving all 4 extremities  Musculoskeletal: No digital cyanosis, clubbing  Skin: No rashes  Psych: much more alert and awake, oriented to self today   Data Reviewed:  I have personally reviewed following labs and imaging studies  Micro Results Recent Results (from the past 240 hour(s))  Urine culture     Status: Abnormal   Collection Time: 09/26/17  5:32 PM  Result Value Ref Range Status   Specimen Description URINE, CLEAN CATCH  Final   Special Requests NONE  Final   Culture (A)  Final    <10,000 COLONIES/mL INSIGNIFICANT GROWTH Performed at [facility], [address], [place], [place] [address]    Report Status 09/27/2017 FINAL  Final  Blood Culture (routine x 2)     Status: Abnormal   Collection Time: 09/26/17  6:41 PM  Result Value Ref Range Status   Specimen Description BLOOD RIGHT ANTECUBITAL  Final   Special Requests   Final    BOTTLES DRAWN AEROBIC AND ANAEROBIC Blood Culture results may not be optimal due to an excessive volume of blood received in culture bottles   Culture  Setup Time   Final    IN BOTH AEROBIC AND ANAEROBIC BOTTLES GRAM POSITIVE COCCI CRITICAL RESULT CALLED TO, READ BACK BY AND VERIFIED WITH: C BENDER PHARMD 09/27/17 9735  JDW Performed at [facility], [place] [address], [place], [place] [address]    Culture STAPHYLOCOCCUS AUREUS (A)  Final   Report Status 09/29/2017 FINAL  Final   Organism ID, Bacteria STAPHYLOCOCCUS AUREUS  Final      Susceptibility   Staphylococcus aureus - MIC*    CIPROFLOXACIN <=0.5 SENSITIVE Sensitive     ERYTHROMYCIN <=0.25 SENSITIVE Sensitive     GENTAMICIN <=0.5 SENSITIVE Sensitive     OXACILLIN 0.5 SENSITIVE Sensitive     TETRACYCLINE >=16 RESISTANT Resistant     VANCOMYCIN 1 SENSITIVE Sensitive     TRIMETH/SULFA <=10 SENSITIVE Sensitive     CLINDAMYCIN <=0.25 SENSITIVE Sensitive     RIFAMPIN <=0.5 SENSITIVE Sensitive     Inducible Clindamycin NEGATIVE Sensitive     * STAPHYLOCOCCUS AUREUS  Blood Culture ID Panel (Reflexed)     Status: Abnormal   Collection Time: 09/26/17  6:41 PM  Result Value Ref Range Status   Enterococcus species NOT DETECTED NOT DETECTED Final   Listeria monocytogenes NOT DETECTED NOT DETECTED Final   Staphylococcus species DETECTED (A) NOT DETECTED Final    Comment: CRITICAL RESULT CALLED TO, READ BACK BY AND VERIFIED WITH: C BENDER PHARMD 09/27/17 0721 JDW    Staphylococcus aureus DETECTED (A) NOT DETECTED Final    Comment: Methicillin (oxacillin) susceptible Staphylococcus aureus (MSSA). Preferred therapy is anti staphylococcal beta lactam antibiotic (Cefazolin or Nafcillin), unless clinically contraindicated. CRITICAL RESULT CALLED TO, READ BACK BY AND VERIFIED WITH: C BENDER PHARMD 09/27/17 0721 JDW    Methicillin resistance NOT DETECTED NOT DETECTED Final   Streptococcus species NOT DETECTED NOT DETECTED Final   Streptococcus agalactiae NOT DETECTED NOT DETECTED Final   Streptococcus pneumoniae NOT DETECTED NOT DETECTED Final   Streptococcus pyogenes NOT DETECTED NOT DETECTED Final   Acinetobacter baumannii NOT DETECTED NOT DETECTED Final   Enterobacteriaceae species NOT  DETECTED NOT DETECTED Final   Enterobacter cloacae complex NOT DETECTED  NOT DETECTED Final   Escherichia coli NOT DETECTED NOT DETECTED Final   Klebsiella oxytoca NOT DETECTED NOT DETECTED Final   Klebsiella pneumoniae NOT DETECTED NOT DETECTED Final   Proteus species NOT DETECTED NOT DETECTED Final   Serratia marcescens NOT DETECTED NOT DETECTED Final   Haemophilus influenzae NOT DETECTED NOT DETECTED Final   Neisseria meningitidis NOT DETECTED NOT DETECTED Final   Pseudomonas aeruginosa NOT DETECTED NOT DETECTED Final   Candida albicans NOT DETECTED NOT DETECTED Final   Candida glabrata NOT DETECTED NOT DETECTED Final   Candida krusei NOT DETECTED NOT DETECTED Final   Candida parapsilosis NOT DETECTED NOT DETECTED Final   Candida tropicalis NOT DETECTED NOT DETECTED Final  Culture, blood (routine x 2)     Status: None (Preliminary result)   Collection Time: 09/28/17  4:35 AM  Result Value Ref Range Status   Specimen Description BLOOD RIGHT ANTECUBITAL  Final   Special Requests   Final    BOTTLES DRAWN AEROBIC ONLY Blood Culture adequate volume   Culture   Final    NO GROWTH 3 DAYS Performed at [facility], [address], [place], [place] [address]    Report Status PENDING  Incomplete  Culture, blood (routine x 2)     Status: None (Preliminary result)   Collection Time: 09/28/17  4:42 AM  Result Value Ref Range Status   Specimen Description BLOOD LEFT HAND  Final   Special Requests   Final    BOTTLES DRAWN AEROBIC ONLY Blood Culture adequate volume   Culture   Final    NO GROWTH 3 DAYS Performed at [facility], [place] [address], [place], [place] [address]    Report Status PENDING  Incomplete    Radiology Reports Ct Abdomen Pelvis Wo Contrast  Result Date: 09/26/2017 CLINICAL DATA:  Fall with low back pain EXAM: CT ABDOMEN AND PELVIS WITHOUT CONTRAST TECHNIQUE: Multidetector CT imaging of the abdomen and pelvis was performed following the standard protocol without IV contrast. COMPARISON:  01/10/2013 CT FINDINGS: Lower chest: Lung  bases demonstrate small pleural effusions. No consolidation. Cardiomegaly. Coronary vascular calcification. Hepatobiliary: No focal liver abnormality is seen. No gallstones, gallbladder wall thickening, or biliary dilatation. Pancreas: Unremarkable. No pancreatic ductal dilatation or surrounding inflammatory changes. Spleen: Normal in size without focal abnormality. Adrenals/Urinary Tract: Adrenal glands are unremarkable. Kidneys are normal, without renal calculi, focal lesion, or hydronephrosis. Bladder is unremarkable. Stomach/Bowel: Stomach is within normal limits. Interval appendectomy. No evidence of bowel wall thickening, distention, or inflammatory changes. Vascular/Lymphatic: Moderate aortic atherosclerosis. No aneurysmal dilatation. No significantly enlarged lymph nodes Reproductive: Prostate is unremarkable. Other: Negative for free air or free fluid. Small fat in the inguinal canals. Musculoskeletal: Scoliosis of the spine. Trace retrolisthesis L1 on L2 and L2 on L3. Multiple level degenerative change. No acute osseous abnormality. IMPRESSION: 1. No CT evidence for acute intra-abdominal or pelvic abnormality. 2. Cardiomegaly.  Small pleural effusions. Electronically Signed   By: Donavan Foil M.D.   On: 09/26/2017 19:42   Dg Chest 2 View  Result Date: 09/26/2017 CLINICAL DATA:  [place] 2 days ago at home, fever, history coronary disease post CABG, hypertension, diabetes mellitus, diabetic neuropathy and medial LEFT foot wound EXAM: CHEST - 2 VIEW COMPARISON:  01/10/2013 FINDINGS: Enlargement of cardiac silhouette post CABG. Mediastinal contours and pulmonary vascularity normal. Atherosclerotic calcification aorta. Decreased lung volumes with mild bibasilar atelectasis. Remaining lungs clear. No pleural  effusion or pneumothorax. Bones demineralized. IMPRESSION: Enlargement of cardiac silhouette post CABG. Bibasilar atelectasis. Electronically Signed   By: Lavonia Dana M.D.   On: 09/26/2017 15:02   Dg  Lumbar Spine Complete  Result Date: 09/29/2017 CLINICAL DATA:  Bacteremia.  Back pain EXAM: LUMBAR SPINE - COMPLETE 4+ VIEW COMPARISON:  CT abdomen and pelvis 09/26/2017 FINDINGS: Moderate to advanced diffuse degenerative disc disease, most pronounced at L1-2 and L2-3 with disc space narrowing, spurring, endplate sclerosis and vacuum disc. Diffuse degenerative facet disease, most pronounced in the lower lumbar spine. Slight anterolisthesis of L4 on L5 related to facet disease. No fracture. SI joints are symmetric and unremarkable. IMPRESSION: Moderate to advanced degenerative disc and facet disease. No acute findings. Electronically Signed   By: Rolm Baptise M.D.   On: 09/29/2017 15:59   Mr Foot Left Wo Contrast  Result Date: 09/29/2017 CLINICAL DATA:  Sepsis. Foot ulcers mainly along the medial base of the big toe. EXAM: MRI OF THE LEFT FOOT WITHOUT CONTRAST TECHNIQUE: Multiplanar, multisequence MR imaging of the left foot was performed. No intravenous contrast was administered. COMPARISON:  Radiograph 09/26/2017 FINDINGS: Very limited examination.  The patient would not hold still. Motion degraded images with poor distal fat saturation make it very difficult to evaluate for osteomyelitis. I do not see any obvious T1 signal abnormality to suggest osteomyelitis. There is diffuse cellulitis and myofasciitis without obvious soft tissue abscess. There appears to be a skin blister along the lateral aspect of the foot near the fifth toe. IMPRESSION: Very limited examination but no obvious findings for septic arthritis or osteomyelitis. Diffuse cellulitis and myofasciitis. Electronically Signed   By: Marijo Sanes M.D.   On: 09/29/2017 16:44   Dg Chest Port 1 View  Result Date: 09/29/2017 CLINICAL DATA:  Dyspnea EXAM: PORTABLE CHEST 1 VIEW COMPARISON:  09/26/2017; 12/31/2012; 04/02/2009 FINDINGS: Grossly unchanged enlarged cardiac silhouette and mediastinal contours with atherosclerotic plaque within a tortuous and  potentially ectatic thoracic aorta. Post median sternotomy and CABG. Pulmonary vasculature appears less distinct on the present examination with cephalization of flow. Suspected trace layering bilateral effusions. Worsening bibasilar heterogeneous/consolidative opacities, left greater than right. No pneumothorax. No acute osseus abnormalities. IMPRESSION: Findings most suggestive of pulmonary edema with suspected trace bilateral effusions and associated bibasilar opacities, atelectasis versus infiltrate. Electronically Signed   By: Sandi Mariscal M.D.   On: 09/29/2017 09:05   Dg Foot Complete Left  Result Date: 09/26/2017 CLINICAL DATA:  [place].  Pain.  Medial wound. EXAM: LEFT FOOT - COMPLETE 3+ VIEW COMPARISON:  None. FINDINGS: There is no evidence of fracture or dislocation. There is no evidence of arthropathy or other focal bone abnormality. Soft tissues are unremarkable. IMPRESSION: Negative. Electronically Signed   By: Staci Righter M.D.   On: 09/26/2017 15:01   [place] Ekg Site Rite  Result Date: 10/01/2017 If Site Rite image not attached, placement could not be confirmed due to current cardiac rhythm.   Lab Data:  CBC: Recent Labs  Lab 09/26/17 1551 09/27/17 0042 09/28/17 0435 09/29/17 0942 09/30/17 0459 10/01/17 0641  WBC 23.3* 18.9* 16.2* 18.3* 13.3* 11.0*  NEUTROABS 21.6* 17.7*  --   --   --   --   HGB 11.0* 9.7* 9.6* 10.4* 9.8* 10.8*  HCT 35.2* 29.8* 29.6* 32.2* 28.7* 32.7*  MCV 109.7* 103.5* 103.9* 102.2* 98.6 101.6*  PLT 175 177 180 274 249 638   Basic Metabolic Panel: Recent Labs  Lab 09/27/17 0042 09/28/17 0435 09/29/17 7564 09/30/17 0459 10/01/17 3329  NA 137 135 137 135 137  K 4.2 3.9 3.9 3.1* 3.1*  CL 106 106 108 106 103  CO2 17* 21* 17* 22 22  GLUCOSE 262* 145* 187* 152* 227*  BUN 31* 37* 36* 37* 40*  CREATININE 1.69* 1.44* 1.32* 1.33* 1.36*  CALCIUM 7.7* 7.7* 8.0* 7.8* 8.0*   GFR: Estimated Creatinine Clearance: 31.8 mL/min (A) (by C-G formula based on SCr of  1.36 mg/dL (H)). Liver Function Tests: Recent Labs  Lab 09/26/17 1551 09/27/17 0042  AST 32 29  ALT 26 23  ALKPHOS 66 55  BILITOT 1.8* 1.4*  PROT 5.9* 5.3*  ALBUMIN 2.9* 2.5*   No results for input(s): LIPASE, AMYLASE in the last 168 hours. No results for input(s): AMMONIA in the last 168 hours. Coagulation Profile: Recent Labs  Lab 09/26/17 2212  INR 1.56   Cardiac Enzymes: Recent Labs  Lab 09/26/17 2212  CKTOTAL 113   BNP (last 3 results) No results for input(s): PROBNP in the last 8760 hours. HbA1C: No results for input(s): HGBA1C in the last 72 hours. CBG: Recent Labs  Lab 09/30/17 1158 09/30/17 1720 09/30/17 2156 10/01/17 0818 10/01/17 1158  GLUCAP 233* 222* 236* 236* 251*   Lipid Profile: No results for input(s): CHOL, HDL, LDLCALC, TRIG, CHOLHDL, LDLDIRECT in the last 72 hours. Thyroid Function Tests: No results for input(s): TSH, T4TOTAL, FREET4, T3FREE, THYROIDAB in the last 72 hours. Anemia Panel: No results for input(s): VITAMINB12, FOLATE, FERRITIN, TIBC, IRON, RETICCTPCT in the last 72 hours. Urine analysis:    Component Value Date/Time   COLORURINE AMBER (A) 09/26/2017 1732   APPEARANCEUR CLEAR 09/26/2017 1732   APPEARANCEUR Clear 07/21/2016 1617   LABSPEC 1.020 09/26/2017 1732   PHURINE 5.0 09/26/2017 1732   GLUCOSEU NEGATIVE 09/26/2017 1732   HGBUR NEGATIVE 09/26/2017 1732   BILIRUBINUR SMALL (A) 09/26/2017 1732   BILIRUBINUR Positive (A) 07/21/2016 1617   KETONESUR 5 (A) 09/26/2017 1732   PROTEINUR 30 (A) 09/26/2017 1732   UROBILINOGEN negative 03/12/2015 1619   NITRITE NEGATIVE 09/26/2017 1732   LEUKOCYTESUR NEGATIVE 09/26/2017 1732   LEUKOCYTESUR Negative 07/21/2016 1617     Mardy Lucier M.D. [facility] 10/01/2017, 12:19 PM  Pager: [phone] Between 7am to 7pm - call Pager - [phone]  After 7pm go to [email or web address] - password [email or web address]  Call night coverage person covering after 7pm

## 2017-10-01 NOTE — Progress Notes (Signed)
Dressing changed per wound care instruction

## 2017-10-01 NOTE — Progress Notes (Signed)
Progress Note  Patient Name: [name] [name] Date of Encounter: 10/01/2017  Primary Cardiologist: Minus Breeding, MD   Subjective   Feels well. States he is ready to go home  Inpatient Medications    Scheduled Meds: . aspirin EC  81 mg Oral Daily  . B-complex with vitamin C  1 tablet Oral QODAY  . donepezil  10 mg Oral QHS  . famotidine  20 mg Oral Daily  . furosemide  40 mg Intravenous Q12H  . heparin  5,000 Units Subcutaneous Q8H  . insulin aspart  0-15 Units Subcutaneous TID WC  . insulin aspart  0-5 Units Subcutaneous QHS  . ipratropium-albuterol  3 mL Nebulization TID  . levothyroxine  50 mcg Oral QAC breakfast  . methylPREDNISolone (SOLU-MEDROL) injection  60 mg Intravenous Q12H  . metoprolol succinate  50 mg Oral Daily  . mometasone-formoterol  2 puff Inhalation BID  . multivitamin with minerals  1 tablet Oral Daily  . potassium chloride  40 mEq Oral Daily  . sodium chloride flush  3 mL Intravenous Q12H   Continuous Infusions: .  ceFAZolin (ANCEF) IV 2 g (09/30/17 2203)   PRN Meds: acetaminophen **OR** acetaminophen, bisacodyl, fentaNYL (SUBLIMAZE) injection, hydrALAZINE, HYDROcodone-acetaminophen, ondansetron **OR** ondansetron (ZOFRAN) IV, senna-docusate   Vital Signs    Vitals:   09/30/17 2156 10/01/17 0500 10/01/17 0537 10/01/17 0759  BP: 131/71  (!) 105/40   Pulse: 85  (!) 57   Resp: 20  18   Temp: 99.2 F (37.3 C)  98.6 F (37 C)   TempSrc: Oral  Oral   SpO2: 96%  97% 97%  Weight:  174 lb (78.9 kg)    Height:        Intake/Output Summary (Last 24 hours) at 10/01/2017 1036 Last data filed at 10/01/2017 0900 Gross per 24 hour  Intake 950 ml  Output -  Net 950 ml   Filed Weights   09/28/17 0623 09/29/17 0624 10/01/17 0500  Weight: 181 lb (82.1 kg) 185 lb (83.9 kg) 174 lb (78.9 kg)    Telemetry    NSR with frequent PACs and PVCs.  - Personally Reviewed  ECG    NSR with LBBB - Personally Reviewed  Physical Exam   GEN: No acute distress.   Elderly obese WM.  Neck: No JVD Cardiac: RRR, no murmurs, rubs, or gallops.  Respiratory:  crackles bilateral bases with scattered wheezes.  GI: Soft, nontender, non-distended  MS: No edema; No deformity. Neuro:  Nonfocal  Psych: Normal affect   Labs    Chemistry Recent Labs  Lab 09/26/17 1551 09/27/17 0042  09/29/17 0942 09/30/17 0459 10/01/17 0641  NA 139 137   < > 137 135 137  K 4.7 4.2   < > 3.9 3.1* 3.1*  CL 102 106   < > 108 106 103  CO2 19* 17*   < > 17* 22 22  GLUCOSE 207* 262*   < > 187* 152* 227*  BUN 31* 31*   < > 36* 37* 40*  CREATININE 1.82* 1.69*   < > 1.32* 1.33* 1.36*  CALCIUM 8.5* 7.7*   < > 8.0* 7.8* 8.0*  PROT 5.9* 5.3*  --   --   --   --   ALBUMIN 2.9* 2.5*  --   --   --   --   AST 32 29  --   --   --   --   ALT 26 23  --   --   --   --  ALKPHOS 66 55  --   --   --   --   BILITOT 1.8* 1.4*  --   --   --   --   GFRNONAA 30* 32*   < > 44* 43* 42*  GFRAA 34* 38*   < > 51* 50* 49*  ANIONGAP 18* 14   < > 12 7 12    < > = values in this interval not displayed.     Hematology Recent Labs  Lab 09/29/17 0942 09/30/17 0459 10/01/17 0641  WBC 18.3* 13.3* 11.0*  RBC 3.15* 2.91* 3.22*  HGB 10.4* 9.8* 10.8*  HCT 32.2* 28.7* 32.7*  MCV 102.2* 98.6 101.6*  MCH 33.0 33.7 33.5  MCHC 32.3 34.1 33.0  RDW 13.8 13.6 13.3  PLT 274 249 284    Cardiac EnzymesNo results for input(s): TROPONINI in the last 168 hours. No results for input(s): TROPIPOC in the last 168 hours.   BNP Recent Labs  Lab 09/29/17 0942  BNP 3,317.6*     DDimer No results for input(s): DDIMER in the last 168 hours.   Radiology    Dg Lumbar Spine Complete  Result Date: 09/29/2017 CLINICAL DATA:  Bacteremia.  Back pain EXAM: LUMBAR SPINE - COMPLETE 4+ VIEW COMPARISON:  CT abdomen and pelvis 09/26/2017 FINDINGS: Moderate to advanced diffuse degenerative disc disease, most pronounced at L1-2 and L2-3 with disc space narrowing, spurring, endplate sclerosis and vacuum disc. Diffuse  degenerative facet disease, most pronounced in the lower lumbar spine. Slight anterolisthesis of L4 on L5 related to facet disease. No fracture. SI joints are symmetric and unremarkable. IMPRESSION: Moderate to advanced degenerative disc and facet disease. No acute findings. Electronically Signed   By: Rolm Baptise M.D.   On: 09/29/2017 15:59   Mr Foot Left Wo Contrast  Result Date: 09/29/2017 CLINICAL DATA:  Sepsis. Foot ulcers mainly along the medial base of the big toe. EXAM: MRI OF THE LEFT FOOT WITHOUT CONTRAST TECHNIQUE: Multiplanar, multisequence MR imaging of the left foot was performed. No intravenous contrast was administered. COMPARISON:  Radiograph 09/26/2017 FINDINGS: Very limited examination.  The patient would not hold still. Motion degraded images with poor distal fat saturation make it very difficult to evaluate for osteomyelitis. I do not see any obvious T1 signal abnormality to suggest osteomyelitis. There is diffuse cellulitis and myofasciitis without obvious soft tissue abscess. There appears to be a skin blister along the lateral aspect of the foot near the fifth toe. IMPRESSION: Very limited examination but no obvious findings for septic arthritis or osteomyelitis. Diffuse cellulitis and myofasciitis. Electronically Signed   By: Marijo Sanes M.D.   On: 09/29/2017 16:44   [place] Ekg Site Rite  Result Date: 10/01/2017 If Site Rite image not attached, placement could not be confirmed due to current cardiac rhythm.   Cardiac Studies   Echo 09/27/17: Study Conclusions  - Left ventricle: The cavity size was normal. Wall thickness was increased in a pattern of mild LVH. Systolic function was severely reduced. The estimated ejection fraction was in the range of 25% to 30%. Diffuse hypokinesis. Doppler parameters are consistent with abnormal left ventricular relaxation (grade 1 diastolic dysfunction). - Aortic valve: There was mild regurgitation. - Aortic root: The aortic  root was mildly dilated. - Mitral valve: There was mild regurgitation. - Left atrium: The atrium was mildly dilated. - Right ventricle: The cavity size was mildly dilated. - Pulmonary arteries: Systolic pressure was mildly increased. PA peak pressure: 42 mm Hg (S).  Impressions:  -  Severe LV dysfunction; mild diastolic dysfunction; mild LVH; mild AI; mildly dilated aortic root; mild MR; mild LAE; mild RVE; mild TR with mild pulmonary hypertension.      Patient Profile     84 y.o. male with a PMH of CAD s/p CABG 1998, HTN, HLD, DM type 2, hypothyroidism, dementia who is being seen today for the evaluation of acute combined CHF     Assessment & Plan    1. Acute combined CHF: patient presented with sepsis 2/2 MSSA bacteremia and LLE cellulitis. He was given IVF at the time of admission and subsequently developed SOB and hypoxia 09/29/17. CXR revealed pulmonary edema. BNP elevated to 3317. EKG with LBBB (previously incomplete).  Echo 09/27/17 with EF 25-30% with G1DD, and diffuse hypokinesis (previous echo 2011 with EF 50-55%). Last ischemic evaluation was a NST 2011 which was without ischemia. Possible he has had an ischemic event vs progression of disease with subsequent drop in EF.  Does not appear to have significant tachyarrhythmias either to contribute to his low EF. Family is not interested in invasive testing such as a LHC, nor would he be a good candidate given advanced age and dementia. Favor medical management. - Continue metoprolol XL - Continue diuresis with IV lasix 40mg  BID - still with crackles at lung bases on exam. It is difficult to judge response to diuresis since urine output not recorded in record (foley catheter is full). Weight is about the same. - Continue to monitor strict I&Os and daily weights - Monitor electrolytes closely and replete as needed: K>4; Mg >2 - Consider restarting ARB if Cr improves to baseline 1.2  2. Sepsis 2/2 MSSA bacteremia: patient  presented with increasing lethargy, found to have MSSA bacteremia of unclear source. XR L foot and lumbar spine negative for acute findings. MRI L foot with cellulitis/myofaciitis but no osteomyelitis or septic arthritis. Echo without evidence of endocarditis. After discussion with family, decision made to pursue IV ABX for 6 weeks in an effort to minimize procedures/test and not perform MRI L spine or TEE.  - Continue management per primary team/ ID  3. CAD s/p CABG 1998: patient has been without anginal complaints. Last ischemic evaluation was a NST 2011 which was without ischemia.  - Continue ASA and metoprolol - Not on statin therapy due to age  65. HTN: BP intermittently elevated - Continue metoprolol XL - unable to titrate given bradycardia - Consider restarting home irbesartan if Cr improves to baseline 1.2  5. DM type 2: Last A1C 6.1 04/2017; at goal of <7 - Continue management per primary team  6. HLD: Last LDL 64 04/2017; at goal of <70 - No need for statin therapy  7. CKD stage III: presented with acute on chronic CKD with Cr 1.8;  1.36 today.  - Continue to monitor with diuresis          For questions or updates, please contact [place] Please consult [email or web address] for contact info under Cardiology/STEMI.      Signed, Jannie Doyle [place], MD  10/01/2017, 10:36 AM

## 2017-10-02 ENCOUNTER — Inpatient Hospital Stay (HOSPITAL_COMMUNITY): Payer: Medicare Other

## 2017-10-02 LAB — BASIC METABOLIC PANEL
ANION GAP: 8 (ref 5–15)
BUN: 47 mg/dL — ABNORMAL HIGH (ref 8–23)
CALCIUM: 7.8 mg/dL — AB (ref 8.9–10.3)
CO2: 24 mmol/L (ref 22–32)
Chloride: 105 mmol/L (ref 98–111)
Creatinine, Ser: 1.61 mg/dL — ABNORMAL HIGH (ref 0.61–1.24)
GFR, EST AFRICAN AMERICAN: 40 mL/min — AB (ref >60)
GFR, EST NON AFRICAN AMERICAN: 34 mL/min — AB (ref >60)
Glucose, Bld: 216 mg/dL — ABNORMAL HIGH (ref 70–99)
Potassium: 3.2 mmol/L — ABNORMAL LOW (ref 3.5–5.1)
SODIUM: 137 mmol/L (ref 135–145)

## 2017-10-02 LAB — CBC
HEMATOCRIT: 28.5 % — AB (ref 39.0–52.0)
Hemoglobin: 9.5 g/dL — ABNORMAL LOW (ref 13.0–17.0)
MCH: 32.6 pg (ref 26.0–34.0)
MCHC: 33.3 g/dL (ref 30.0–36.0)
MCV: 97.9 fL (ref 78.0–100.0)
Platelets: 305 10*3/uL (ref 150–400)
RBC: 2.91 MIL/uL — ABNORMAL LOW (ref 4.22–5.81)
RDW: 13.2 % (ref 11.5–15.5)
WBC: 15.2 10*3/uL — AB (ref 4.0–10.5)

## 2017-10-02 LAB — GLUCOSE, CAPILLARY
GLUCOSE-CAPILLARY: 212 mg/dL — AB (ref 70–99)
GLUCOSE-CAPILLARY: 220 mg/dL — AB (ref 70–99)
GLUCOSE-CAPILLARY: 206 mg/dL — AB (ref 70–99)
Glucose-Capillary: 216 mg/dL — ABNORMAL HIGH (ref 70–99)

## 2017-10-02 MED ORDER — METOPROLOL SUCCINATE ER 25 MG PO TB24
25.0000 mg | ORAL_TABLET | Freq: Every day | ORAL | Status: DC
  Administered 2017-10-03: 25 mg via ORAL
  Filled 2017-10-02: qty 1

## 2017-10-02 MED ORDER — PREDNISONE 20 MG PO TABS
40.0000 mg | ORAL_TABLET | Freq: Every day | ORAL | Status: DC
  Administered 2017-10-03: 40 mg via ORAL
  Filled 2017-10-02: qty 2

## 2017-10-02 MED ORDER — POTASSIUM CHLORIDE CRYS ER 20 MEQ PO TBCR
40.0000 meq | EXTENDED_RELEASE_TABLET | Freq: Once | ORAL | Status: AC
  Administered 2017-10-02: 40 meq via ORAL
  Filled 2017-10-02: qty 2

## 2017-10-02 MED ORDER — SODIUM CHLORIDE 0.9% FLUSH
10.0000 mL | Freq: Two times a day (BID) | INTRAVENOUS | Status: DC
  Administered 2017-10-02 – 2017-10-03 (×2): 10 mL

## 2017-10-02 NOTE — Progress Notes (Signed)
Peripherally Inserted Central Catheter/Midline Placement  The IV Nurse has discussed with the patient and/or persons authorized to consent for the patient, the purpose of this procedure and the potential benefits and risks involved with this procedure.  The benefits include less needle sticks, lab draws from the catheter, and the patient may be discharged home with the catheter. Risks include, but not limited to, infection, bleeding, blood clot (thrombus formation), and puncture of an artery; nerve damage and irregular heartbeat and possibility to perform a PICC exchange if needed/ordered by physician.  Alternatives to this procedure were also discussed.  B[employer] Power PICC patient education guide, fact sheet on infection prevention and patient information card has been provided to patient /or left at bedside.    PICC/Midline Placement Documentation  PICC Single Lumen 92/95/74 PICC Right Basilic 38 cm 0 cm (Active)  Indication for Insertion or Continuance of Line Home intravenous therapies (PICC only) 10/02/2017  9:36 AM  Exposed Catheter (cm) 0 cm 10/02/2017  9:36 AM  Site Assessment Clean;Dry;Intact 10/02/2017  9:36 AM  Line Status Flushed;Blood return noted;Saline locked 10/02/2017  9:36 AM  Dressing Type Transparent 10/02/2017  9:36 AM  Dressing Status Clean;Dry;Intact;Antimicrobial disc in place 10/02/2017  9:36 AM  Dressing Change Due 10/09/17 10/02/2017  9:36 AM       [address] 10/02/2017, 9:39 AM

## 2017-10-02 NOTE — Progress Notes (Signed)
Progress Note  Patient Name: [name] [name] Date of Encounter: 10/02/2017  Primary Cardiologist: Minus Breeding, MD   Subjective   Feels well. No complaints.  Inpatient Medications    Scheduled Meds: . aspirin EC  81 mg Oral Daily  . B-complex with vitamin C  1 tablet Oral QODAY  . donepezil  10 mg Oral QHS  . famotidine  20 mg Oral Daily  . heparin  5,000 Units Subcutaneous Q8H  . insulin aspart  0-15 Units Subcutaneous TID WC  . insulin aspart  0-5 Units Subcutaneous QHS  . ipratropium-albuterol  3 mL Nebulization TID  . levothyroxine  50 mcg Oral QAC breakfast  . methylPREDNISolone (SOLU-MEDROL) injection  40 mg Intravenous Q12H  . [START ON 10/03/2017] metoprolol succinate  25 mg Oral Daily  . mometasone-formoterol  2 puff Inhalation BID  . multivitamin with minerals  1 tablet Oral Daily  . potassium chloride  40 mEq Oral Daily  . potassium chloride  40 mEq Oral Once  . sodium chloride flush  3 mL Intravenous Q12H   Continuous Infusions: .  ceFAZolin (ANCEF) IV 2 g (10/02/17 0838)   PRN Meds: acetaminophen **OR** acetaminophen, bisacodyl, fentaNYL (SUBLIMAZE) injection, hydrALAZINE, HYDROcodone-acetaminophen, ondansetron **OR** ondansetron (ZOFRAN) IV, senna-docusate   Vital Signs    Vitals:   10/01/17 0759 10/01/17 1400 10/01/17 2130 10/02/17 0714  BP:  131/61 (!) 147/83   Pulse:  72 68   Resp:  (!) 22 18   Temp:  98.8 F (37.1 C) 98 F (36.7 C)   TempSrc:  Oral Oral   SpO2: 97% 96% 98%   Weight:    176 lb (79.8 kg)  Height:        Intake/Output Summary (Last 24 hours) at 10/02/2017 0936 Last data filed at 10/02/2017 0619 Gross per 24 hour  Intake 940 ml  Output 1725 ml  Net -785 ml   Filed Weights   09/29/17 0624 10/01/17 0500 10/02/17 0714  Weight: 185 lb (83.9 kg) 174 lb (78.9 kg) 176 lb (79.8 kg)    Telemetry    NSR with frequent PACs. Bradycardic this am with HR down as low as 47.   - Personally Reviewed  ECG    None today - Personally  Reviewed  Physical Exam   GEN: No acute distress.  Elderly obese WM.  Neck: No JVD Cardiac: RRR, no murmurs, rubs, or gallops.  Respiratory:  crackles bilateral bases. Wheezes improved. GI: Soft, nontender, non-distended  MS: No edema; No deformity. Neuro:  Nonfocal  Psych: Normal affect   Labs    Chemistry Recent Labs  Lab 09/26/17 1551 09/27/17 0042  09/30/17 0459 10/01/17 0641 10/02/17 0433  NA 139 137   < > 135 137 137  K 4.7 4.2   < > 3.1* 3.1* 3.2*  CL 102 106   < > 106 103 105  CO2 19* 17*   < > 22 22 24   GLUCOSE 207* 262*   < > 152* 227* 216*  BUN 31* 31*   < > 37* 40* 47*  CREATININE 1.82* 1.69*   < > 1.33* 1.36* 1.61*  CALCIUM 8.5* 7.7*   < > 7.8* 8.0* 7.8*  PROT 5.9* 5.3*  --   --   --   --   ALBUMIN 2.9* 2.5*  --   --   --   --   AST 32 29  --   --   --   --   ALT 26 23  --   --   --   --  ALKPHOS 66 55  --   --   --   --   BILITOT 1.8* 1.4*  --   --   --   --   GFRNONAA 30* 32*   < > 43* 42* 34*  GFRAA 34* 38*   < > 50* 49* 40*  ANIONGAP 18* 14   < > 7 12 8    < > = values in this interval not displayed.     Hematology Recent Labs  Lab 09/30/17 0459 10/01/17 0641 10/02/17 0433  WBC 13.3* 11.0* 15.2*  RBC 2.91* 3.22* 2.91*  HGB 9.8* 10.8* 9.5*  HCT 28.7* 32.7* 28.5*  MCV 98.6 101.6* 97.9  MCH 33.7 33.5 32.6  MCHC 34.1 33.0 33.3  RDW 13.6 13.3 13.2  PLT 249 284 305    Cardiac EnzymesNo results for input(s): TROPONINI in the last 168 hours. No results for input(s): TROPIPOC in the last 168 hours.   BNP Recent Labs  Lab 09/29/17 0942  BNP 3,317.6*     DDimer No results for input(s): DDIMER in the last 168 hours.   Radiology    [place] Ekg Site Rite  Result Date: 10/01/2017 If [facility] image not attached, placement could not be confirmed due to current cardiac rhythm.   Cardiac Studies   Echo 09/27/17: Study Conclusions  - Left ventricle: The cavity size was normal. Wall thickness was increased in a pattern of mild LVH. Systolic  function was severely reduced. The estimated ejection fraction was in the range of 25% to 30%. Diffuse hypokinesis. Doppler parameters are consistent with abnormal left ventricular relaxation (grade 1 diastolic dysfunction). - Aortic valve: There was mild regurgitation. - Aortic root: The aortic root was mildly dilated. - Mitral valve: There was mild regurgitation. - Left atrium: The atrium was mildly dilated. - Right ventricle: The cavity size was mildly dilated. - Pulmonary arteries: Systolic pressure was mildly increased. PA peak pressure: 42 mm Hg (S).  Impressions:  - Severe LV dysfunction; mild diastolic dysfunction; mild LVH; mild AI; mildly dilated aortic root; mild MR; mild LAE; mild RVE; mild TR with mild pulmonary hypertension.      Patient Profile     84 y.o. male with a PMH of CAD s/p CABG 1998, HTN, HLD, DM type 2, hypothyroidism, dementia who is being seen today for the evaluation of acute combined CHF     Assessment & Plan    1. Acute combined CHF: patient presented with sepsis 2/2 MSSA bacteremia and LLE cellulitis. He was given IVF at the time of admission and subsequently developed SOB and hypoxia 09/29/17. CXR revealed pulmonary edema. BNP elevated to 3317. EKG with LBBB (previously incomplete).  Echo 09/27/17 with EF 25-30% with G1DD, and diffuse hypokinesis (previous echo 2011 with EF 50-55%). Last ischemic evaluation was a NST 2011 which was without ischemia. Possible he has had an ischemic event vs progression of disease with subsequent drop in EF.  Does not appear to have significant tachyarrhythmias either to contribute to his low EF. Family is not interested in invasive testing such as a LHC, nor would he be a good candidate given advanced age and dementia. Favor medical management. - Continue metoprolol XL- but will reduce dose to 25 mg daily given bradycardia - Was receiving lasix IV bid. Now on hold with rising creatinine. Difficult to  assess response as I/O incomplete and weight variable but did appear to diurese well yesterday and weight is at baseline. - Continue to monitor strict I&Os and daily weights -  Monitor electrolytes closely and replete as needed: K>4; Mg >2 - Lasix on hold today. Will resume oral lasix once renal function stabilizes.  2. Sepsis 2/2 MSSA bacteremia: patient presented with increasing lethargy, found to have MSSA bacteremia of unclear source. XR L foot and lumbar spine negative for acute findings. MRI L foot with cellulitis/myofaciitis but no osteomyelitis or septic arthritis. Echo without evidence of endocarditis. After discussion with family, decision made to pursue IV ABX for 6 weeks in an effort to minimize procedures/test and not perform MRI L spine or TEE.  - Continue management per primary team/ ID  3. CAD s/p CABG 1998: patient has been without anginal complaints. Last ischemic evaluation was a NST 2011 which was without ischemia.  - Continue ASA and metoprolol - Not on statin therapy due to age  66. HTN: BP intermittently elevated - Continue metoprolol XL - unable to titrate given bradycardia - Consider restarting home irbesartan if Cr improves to baseline 1.2  5. DM type 2: Last A1C 6.1 04/2017; at goal of <7 - Continue management per primary team  6. HLD: Last LDL 64 04/2017; at goal of <70 - No need for statin therapy  7. CKD stage III: presented with acute on chronic CKD with Cr 1.8;  1.36 today.  - Continue to monitor with diuresis          For questions or updates, please contact [place] Please consult [email or web address] for contact info under Cardiology/STEMI.      Signed, Shylo Zamor [place], MD  10/02/2017, 9:36 AM

## 2017-10-02 NOTE — Progress Notes (Signed)
[facility]                                                                              Patient Demographics  [name] [name], is a 84 y.o. male, DOB - 12/20/1918, YIR:[id number]  Admit date - 09/26/2017   Admitting Physician Vianne Bulls, MD  Outpatient Primary MD for the patient is Claretta Fraise, MD  Outpatient specialists:   LOS - 6  days   Medical records reviewed and are as summarized below:    Chief Complaint  Patient presents with  . Fall  . Skin Ulcer       Brief summary   [name] [name] [name] a 84 y.o.malewith medical history significant fordementia, hypertension, hypothyroidism, coronary artery disease, and type 2 diabetes mellitus, now presenting to the emergency department for evaluation of fatigue and low back pain. Patient lives at home with his wife who also has dementia. They have caregivers come up about 8 hours a day for assistance. He was noted to be sleeping most of the day for the past couple days. He had suffered a ground-level mechanical fall a few days ago and had been complaining of low back pain since then. He has not been complaining of abdominal pain and there has not been any diarrhea or vomiting reported.  Patient was admitted for further work-up.  Assessment & Plan    Principal Problem: Severe sepsis secondary to MSSA bacteremia, nonhealing neuropathic ulcers to the left foot -Unclear source, possibly due to left foot, UA negative for UTI. -2D echo showed EF of 25 to 30% but no obvious vegetations -MRI of the left foot showed diffuse cellulitis and mild fasciitis but no septic arthritis or osteomyelitis.  Lumbar spine x-ray showed moderate to advanced degenerative disc and facet disease but no acute findings -ID following, family is not interested in invasive testing, 2D echo did not show acute endocarditis.  Decision has been made to pursue IV antibiotics for 6 weeks as family has declined MRI of the spine or TEE. -PICC line  placed today, will continue IV antibiotics for 6 weeks as per ID recommendations.  Active problems  Acute on chronic systolic and diastolic CHF exacerbation with pulmonary edema, acute hypoxic respiratory failure - 2D echo 7/2 showed EF of 25 to 30%, with diffuse hypokinesis.   -Continue Toprol-XL, no ACEI or ARB secondary to renal insufficiency. -Hold Lasix, creatinine trended up to 1.6, patient had received Lasix 40 mg IV x1 this morning -If creatinine improving, will transition to oral Lasix in a.m.  -Discontinued IV Solu-Medrol, continue prednisone with taper starting tomorrow  Acute encephalopathy superimposed on dementia -Continue Aricept, -Family requested skilled nursing facility at the time of discharge, social work consulted  Diabetes mellitus type 2, uncontrolled with hyperglycemia, NIDDM -Continue to hold Amaryl -CBGs expected to improve now with discontinuing Solu-Medrol.  Continue moderate sliding scale insulin  Essential hypertension -BP stable, continue Toprol-XL  History of coronary artery disease -Continue aspirin, Toprol-XL -2D echo showed EF of 25 to 30% with grade 1 diastolic dysfunction, diffuse hypokinesis  Hypothyroidism -Continue Synthroid  Mild acute on chronic kidney disease stage III with lactic acidosis -Baseline  creatinine 1.2-1.5 -Creatinine slightly trended up to 1.6 today, hold Lasix  Code Status: DNR DVT Prophylaxis: Heparin subcu Family Communication: No family member in the room  Disposition Plan: Possible discharge to skilled nursing facility tomorrow pending bed available  Time Spent in minutes 25  minutes,   Procedures:  2D echo: EF 25 to 30% with grade 1 diastolic dysfunction, diffuse hypokinesis   Consultants:   Infectious disease Cardiology  Antimicrobials:      Medications  Scheduled Meds: . aspirin EC  81 mg Oral Daily  . B-complex with vitamin C  1 tablet Oral QODAY  . donepezil  10 mg Oral QHS  . famotidine  20  mg Oral Daily  . heparin  5,000 Units Subcutaneous Q8H  . insulin aspart  0-15 Units Subcutaneous TID WC  . insulin aspart  0-5 Units Subcutaneous QHS  . ipratropium-albuterol  3 mL Nebulization TID  . levothyroxine  50 mcg Oral QAC breakfast  . methylPREDNISolone (SOLU-MEDROL) injection  40 mg Intravenous Q12H  . [START ON 10/03/2017] metoprolol succinate  25 mg Oral Daily  . mometasone-formoterol  2 puff Inhalation BID  . multivitamin with minerals  1 tablet Oral Daily  . potassium chloride  40 mEq Oral Daily  . potassium chloride  40 mEq Oral Once  . sodium chloride flush  10-40 mL Intracatheter Q12H  . sodium chloride flush  3 mL Intravenous Q12H   Continuous Infusions: .  ceFAZolin (ANCEF) IV 2 g (10/02/17 0838)   PRN Meds:.acetaminophen **OR** acetaminophen, bisacodyl, fentaNYL (SUBLIMAZE) injection, hydrALAZINE, HYDROcodone-acetaminophen, ondansetron **OR** ondansetron (ZOFRAN) IV, senna-docusate   Antibiotics   Anti-infectives (From admission, onward)   Start     Dose/Rate Route Frequency Ordered Stop   09/27/17 1200  vancomycin (VANCOCIN) IVPB 750 mg/150 ml premix  Status:  Discontinued     750 mg 150 mL/hr over 60 Minutes Intravenous Every 24 hours 09/26/17 2054 09/27/17 0735   09/27/17 1000  ceFAZolin (ANCEF) IVPB 2g/100 mL premix     2 g 200 mL/hr over 30 Minutes Intravenous Every 12 hours 09/27/17 0737     09/27/17 0000  ceFEPIme (MAXIPIME) 1 g in sodium chloride 0.9 % 100 mL IVPB  Status:  Discontinued     1 g 200 mL/hr over 30 Minutes Intravenous Every 24 hours 09/26/17 2054 09/27/17 0735   09/26/17 1815  vancomycin (VANCOCIN) IVPB 1000 mg/200 mL premix     1,000 mg 200 mL/hr over 60 Minutes Intravenous  Once 09/26/17 1813 09/26/17 2051   09/26/17 1815  piperacillin-tazobactam (ZOSYN) IVPB 3.375 g     3.375 g 100 mL/hr over 30 Minutes Intravenous  Once 09/26/17 1813 09/26/17 1934        Subjective:   Brick Hagemann was seen and examined today.  Alert and  awake, no acute issues overnight.  No wheezing.  Feeling a lot better, no chest pain, nausea, vomiting, abdominal pain.  No fevers  Objective:   Vitals:   10/01/17 0759 10/01/17 1400 10/01/17 2130 10/02/17 0714  BP:  131/61 (!) 147/83   Pulse:  72 68   Resp:  (!) 22 18   Temp:  98.8 F (37.1 C) 98 F (36.7 C)   TempSrc:  Oral Oral   SpO2: 97% 96% 98%   Weight:    79.8 kg (176 lb)  Height:        Intake/Output Summary (Last 24 hours) at 10/02/2017 1213 Last data filed at 10/02/2017 1046 Gross per 24 hour  Intake 1160 ml  Output 1725 ml  Net -565 ml     Wt Readings from Last 3 Encounters:  10/02/17 79.8 kg (176 lb)  09/13/17 78.6 kg (173 lb 4 oz)  05/03/17 81.6 kg (180 lb)     Exam   General: Alert and oriented x 2, NAD  Eyes:   HEENT:   Cardiovascular: S1 S2 auscultated, no rubs, murmurs or gallops. Regular rate and rhythm. No pedal edema b/l  Respiratory: Decreased breath sound at the bases otherwise no wheezing  Gastrointestinal: Soft, nontender, nondistended, + bowel sounds  Ext: no pedal edema bilaterally  Neuro: no new deficits  Musculoskeletal: No digital cyanosis, clubbing  Skin: No rashes  Psych: appears close to his baseline, has underlying dementia   Data Reviewed:  I have personally reviewed following labs and imaging studies  Micro Results Recent Results (from the past 240 hour(s))  Urine culture     Status: Abnormal   Collection Time: 09/26/17  5:32 PM  Result Value Ref Range Status   Specimen Description URINE, CLEAN CATCH  Final   Special Requests NONE  Final   Culture (A)  Final    <10,000 COLONIES/mL INSIGNIFICANT GROWTH Performed at [facility], [address] [place], [place] [address]    Report Status 09/27/2017 FINAL  Final  Blood Culture (routine x 2)     Status: Abnormal   Collection Time: 09/26/17  6:41 PM  Result Value Ref Range Status   Specimen Description BLOOD RIGHT ANTECUBITAL  Final   Special Requests    Final    BOTTLES DRAWN AEROBIC AND ANAEROBIC Blood Culture results may not be optimal due to an excessive volume of blood received in culture bottles   Culture  Setup Time   Final    IN BOTH AEROBIC AND ANAEROBIC BOTTLES GRAM POSITIVE COCCI CRITICAL RESULT CALLED TO, READ BACK BY AND VERIFIED WITH: C BENDER PHARMD 09/27/17 0932 JDW Performed at [facility], [place] [address], [place], [place] [address]    Culture STAPHYLOCOCCUS AUREUS (A)  Final   Report Status 09/29/2017 FINAL  Final   Organism ID, Bacteria STAPHYLOCOCCUS AUREUS  Final      Susceptibility   Staphylococcus aureus - MIC*    CIPROFLOXACIN <=0.5 SENSITIVE Sensitive     ERYTHROMYCIN <=0.25 SENSITIVE Sensitive     GENTAMICIN <=0.5 SENSITIVE Sensitive     OXACILLIN 0.5 SENSITIVE Sensitive     TETRACYCLINE >=16 RESISTANT Resistant     VANCOMYCIN 1 SENSITIVE Sensitive     TRIMETH/SULFA <=10 SENSITIVE Sensitive     CLINDAMYCIN <=0.25 SENSITIVE Sensitive     RIFAMPIN <=0.5 SENSITIVE Sensitive     Inducible Clindamycin NEGATIVE Sensitive     * STAPHYLOCOCCUS AUREUS  Blood Culture ID Panel (Reflexed)     Status: Abnormal   Collection Time: 09/26/17  6:41 PM  Result Value Ref Range Status   Enterococcus species NOT DETECTED NOT DETECTED Final   Listeria monocytogenes NOT DETECTED NOT DETECTED Final   Staphylococcus species DETECTED (A) NOT DETECTED Final    Comment: CRITICAL RESULT CALLED TO, READ BACK BY AND VERIFIED WITH: C BENDER PHARMD 09/27/17 0721 JDW    Staphylococcus aureus DETECTED (A) NOT DETECTED Final    Comment: Methicillin (oxacillin) susceptible Staphylococcus aureus (MSSA). Preferred therapy is anti staphylococcal beta lactam antibiotic (Cefazolin or Nafcillin), unless clinically contraindicated. CRITICAL RESULT CALLED TO, READ BACK BY AND VERIFIED WITH: C BENDER PHARMD 09/27/17 0721 JDW    Methicillin resistance NOT DETECTED NOT DETECTED Final  Streptococcus species NOT DETECTED NOT DETECTED Final    Streptococcus agalactiae NOT DETECTED NOT DETECTED Final   Streptococcus pneumoniae NOT DETECTED NOT DETECTED Final   Streptococcus pyogenes NOT DETECTED NOT DETECTED Final   Acinetobacter baumannii NOT DETECTED NOT DETECTED Final   Enterobacteriaceae species NOT DETECTED NOT DETECTED Final   Enterobacter cloacae complex NOT DETECTED NOT DETECTED Final   Escherichia coli NOT DETECTED NOT DETECTED Final   Klebsiella oxytoca NOT DETECTED NOT DETECTED Final   Klebsiella pneumoniae NOT DETECTED NOT DETECTED Final   Proteus species NOT DETECTED NOT DETECTED Final   Serratia marcescens NOT DETECTED NOT DETECTED Final   Haemophilus influenzae NOT DETECTED NOT DETECTED Final   Neisseria meningitidis NOT DETECTED NOT DETECTED Final   Pseudomonas aeruginosa NOT DETECTED NOT DETECTED Final   Candida albicans NOT DETECTED NOT DETECTED Final   Candida glabrata NOT DETECTED NOT DETECTED Final   Candida krusei NOT DETECTED NOT DETECTED Final   Candida parapsilosis NOT DETECTED NOT DETECTED Final   Candida tropicalis NOT DETECTED NOT DETECTED Final  Culture, blood (routine x 2)     Status: None (Preliminary result)   Collection Time: 09/28/17  4:35 AM  Result Value Ref Range Status   Specimen Description BLOOD RIGHT ANTECUBITAL  Final   Special Requests   Final    BOTTLES DRAWN AEROBIC ONLY Blood Culture adequate volume   Culture   Final    NO GROWTH 3 DAYS Performed at [facility], [place] [address], [place], [place] [address]    Report Status PENDING  Incomplete  Culture, blood (routine x 2)     Status: None (Preliminary result)   Collection Time: 09/28/17  4:42 AM  Result Value Ref Range Status   Specimen Description BLOOD LEFT HAND  Final   Special Requests   Final    BOTTLES DRAWN AEROBIC ONLY Blood Culture adequate volume   Culture   Final    NO GROWTH 3 DAYS Performed at [facility], [place] [address] [place], [place] [address]    Report Status PENDING  Incomplete     Radiology Reports Ct Abdomen Pelvis Wo Contrast  Result Date: 09/26/2017 CLINICAL DATA:  Fall with low back pain EXAM: CT ABDOMEN AND PELVIS WITHOUT CONTRAST TECHNIQUE: Multidetector CT imaging of the abdomen and pelvis was performed following the standard protocol without IV contrast. COMPARISON:  01/10/2013 CT FINDINGS: Lower chest: Lung bases demonstrate small pleural effusions. No consolidation. Cardiomegaly. Coronary vascular calcification. Hepatobiliary: No focal liver abnormality is seen. No gallstones, gallbladder wall thickening, or biliary dilatation. Pancreas: Unremarkable. No pancreatic ductal dilatation or surrounding inflammatory changes. Spleen: Normal in size without focal abnormality. Adrenals/Urinary Tract: Adrenal glands are unremarkable. Kidneys are normal, without renal calculi, focal lesion, or hydronephrosis. Bladder is unremarkable. Stomach/Bowel: Stomach is within normal limits. Interval appendectomy. No evidence of bowel wall thickening, distention, or inflammatory changes. Vascular/Lymphatic: Moderate aortic atherosclerosis. No aneurysmal dilatation. No significantly enlarged lymph nodes Reproductive: Prostate is unremarkable. Other: Negative for free air or free fluid. Small fat in the inguinal canals. Musculoskeletal: Scoliosis of the spine. Trace retrolisthesis L1 on L2 and L2 on L3. Multiple level degenerative change. No acute osseous abnormality. IMPRESSION: 1. No CT evidence for acute intra-abdominal or pelvic abnormality. 2. Cardiomegaly.  Small pleural effusions. Electronically Signed   By: Donavan Foil M.D.   On: 09/26/2017 19:42   Dg Chest 2 View  Result Date: 09/26/2017 CLINICAL DATA:  [place] 2 days ago at home, fever, history coronary disease  post CABG, hypertension, diabetes mellitus, diabetic neuropathy and medial LEFT foot wound EXAM: CHEST - 2 VIEW COMPARISON:  01/10/2013 FINDINGS: Enlargement of cardiac silhouette post CABG. Mediastinal contours and pulmonary  vascularity normal. Atherosclerotic calcification aorta. Decreased lung volumes with mild bibasilar atelectasis. Remaining lungs clear. No pleural effusion or pneumothorax. Bones demineralized. IMPRESSION: Enlargement of cardiac silhouette post CABG. Bibasilar atelectasis. Electronically Signed   By: Lavonia Dana M.D.   On: 09/26/2017 15:02   Dg Lumbar Spine Complete  Result Date: 09/29/2017 CLINICAL DATA:  Bacteremia.  Back pain EXAM: LUMBAR SPINE - COMPLETE 4+ VIEW COMPARISON:  CT abdomen and pelvis 09/26/2017 FINDINGS: Moderate to advanced diffuse degenerative disc disease, most pronounced at L1-2 and L2-3 with disc space narrowing, spurring, endplate sclerosis and vacuum disc. Diffuse degenerative facet disease, most pronounced in the lower lumbar spine. Slight anterolisthesis of L4 on L5 related to facet disease. No fracture. SI joints are symmetric and unremarkable. IMPRESSION: Moderate to advanced degenerative disc and facet disease. No acute findings. Electronically Signed   By: Rolm Baptise M.D.   On: 09/29/2017 15:59   Mr Foot Left Wo Contrast  Result Date: 09/29/2017 CLINICAL DATA:  Sepsis. Foot ulcers mainly along the medial base of the big toe. EXAM: MRI OF THE LEFT FOOT WITHOUT CONTRAST TECHNIQUE: Multiplanar, multisequence MR imaging of the left foot was performed. No intravenous contrast was administered. COMPARISON:  Radiograph 09/26/2017 FINDINGS: Very limited examination.  The patient would not hold still. Motion degraded images with poor distal fat saturation make it very difficult to evaluate for osteomyelitis. I do not see any obvious T1 signal abnormality to suggest osteomyelitis. There is diffuse cellulitis and myofasciitis without obvious soft tissue abscess. There appears to be a skin blister along the lateral aspect of the foot near the fifth toe. IMPRESSION: Very limited examination but no obvious findings for septic arthritis or osteomyelitis. Diffuse cellulitis and myofasciitis.  Electronically Signed   By: Marijo Sanes M.D.   On: 09/29/2017 16:44   Dg Chest Port 1 View  Result Date: 10/02/2017 CLINICAL DATA:  PICC placement EXAM: PORTABLE CHEST 1 VIEW COMPARISON:  09/29/2017 chest radiograph. FINDINGS: Right PICC terminates in the lower third of the SVC. Stable configuration of intact sternotomy wires and CABG clips. Stable cardiomediastinal silhouette with mild cardiomegaly. No pneumothorax. No significant right pleural effusion. Stable small left pleural effusion. Borderline mild pulmonary edema, slightly decreased. Mild left basilar opacity, slightly decreased. IMPRESSION: 1. Right PICC terminates in the lower third of the SVC. 2. Stable mild cardiomegaly with borderline mild pulmonary edema, slightly improved. 3. Stable small left pleural effusion with slightly decreased mild left basilar opacity, favor atelectasis. Electronically Signed   By: Ilona Sorrel M.D.   On: 10/02/2017 09:53   Dg Chest Port 1 View  Result Date: 09/29/2017 CLINICAL DATA:  Dyspnea EXAM: PORTABLE CHEST 1 VIEW COMPARISON:  09/26/2017; 12/31/2012; 04/02/2009 FINDINGS: Grossly unchanged enlarged cardiac silhouette and mediastinal contours with atherosclerotic plaque within a tortuous and potentially ectatic thoracic aorta. Post median sternotomy and CABG. Pulmonary vasculature appears less distinct on the present examination with cephalization of flow. Suspected trace layering bilateral effusions. Worsening bibasilar heterogeneous/consolidative opacities, left greater than right. No pneumothorax. No acute osseus abnormalities. IMPRESSION: Findings most suggestive of pulmonary edema with suspected trace bilateral effusions and associated bibasilar opacities, atelectasis versus infiltrate. Electronically Signed   By: Sandi Mariscal M.D.   On: 09/29/2017 09:05   Dg Foot Complete Left  Result Date: 09/26/2017 CLINICAL DATA:  [place].  Pain.  Medial wound. EXAM: LEFT FOOT - COMPLETE 3+ VIEW COMPARISON:  None.  FINDINGS: There is no evidence of fracture or dislocation. There is no evidence of arthropathy or other focal bone abnormality. Soft tissues are unremarkable. IMPRESSION: Negative. Electronically Signed   By: Staci Righter M.D.   On: 09/26/2017 15:01   [place] Ekg Site Rite  Result Date: 10/01/2017 If Site Rite image not attached, placement could not be confirmed due to current cardiac rhythm.   Lab Data:  CBC: Recent Labs  Lab 09/26/17 1551 09/27/17 0042 09/28/17 0435 09/29/17 3299 09/30/17 0459 10/01/17 0641 10/02/17 0433  WBC 23.3* 18.9* 16.2* 18.3* 13.3* 11.0* 15.2*  NEUTROABS 21.6* 17.7*  --   --   --   --   --   HGB 11.0* 9.7* 9.6* 10.4* 9.8* 10.8* 9.5*  HCT 35.2* 29.8* 29.6* 32.2* 28.7* 32.7* 28.5*  MCV 109.7* 103.5* 103.9* 102.2* 98.6 101.6* 97.9  PLT 175 177 180 274 249 284 242   Basic Metabolic Panel: Recent Labs  Lab 09/28/17 0435 09/29/17 0942 09/30/17 0459 10/01/17 0641 10/02/17 0433  NA 135 137 135 137 137  K 3.9 3.9 3.1* 3.1* 3.2*  CL 106 108 106 103 105  CO2 21* 17* 22 22 24   GLUCOSE 145* 187* 152* 227* 216*  BUN 37* 36* 37* 40* 47*  CREATININE 1.44* 1.32* 1.33* 1.36* 1.61*  CALCIUM 7.7* 8.0* 7.8* 8.0* 7.8*   GFR: Estimated Creatinine Clearance: 26.8 mL/min (A) (by C-G formula based on SCr of 1.61 mg/dL (H)). Liver Function Tests: Recent Labs  Lab 09/26/17 1551 09/27/17 0042  AST 32 29  ALT 26 23  ALKPHOS 66 55  BILITOT 1.8* 1.4*  PROT 5.9* 5.3*  ALBUMIN 2.9* 2.5*   No results for input(s): LIPASE, AMYLASE in the last 168 hours. No results for input(s): AMMONIA in the last 168 hours. Coagulation Profile: Recent Labs  Lab 09/26/17 2212  INR 1.56   Cardiac Enzymes: Recent Labs  Lab 09/26/17 2212  CKTOTAL 113   BNP (last 3 results) No results for input(s): PROBNP in the last 8760 hours. HbA1C: No results for input(s): HGBA1C in the last 72 hours. CBG: Recent Labs  Lab 10/01/17 0818 10/01/17 1158 10/01/17 1646 10/01/17 2103  10/02/17 0751  GLUCAP 236* 251* 260* 225* 212*   Lipid Profile: No results for input(s): CHOL, HDL, LDLCALC, TRIG, CHOLHDL, LDLDIRECT in the last 72 hours. Thyroid Function Tests: No results for input(s): TSH, T4TOTAL, FREET4, T3FREE, THYROIDAB in the last 72 hours. Anemia Panel: No results for input(s): VITAMINB12, FOLATE, FERRITIN, TIBC, IRON, RETICCTPCT in the last 72 hours. Urine analysis:    Component Value Date/Time   COLORURINE AMBER (A) 09/26/2017 1732   APPEARANCEUR CLEAR 09/26/2017 1732   APPEARANCEUR Clear 07/21/2016 1617   LABSPEC 1.020 09/26/2017 1732   PHURINE 5.0 09/26/2017 1732   GLUCOSEU NEGATIVE 09/26/2017 1732   HGBUR NEGATIVE 09/26/2017 1732   BILIRUBINUR SMALL (A) 09/26/2017 1732   BILIRUBINUR Positive (A) 07/21/2016 1617   KETONESUR 5 (A) 09/26/2017 1732   PROTEINUR 30 (A) 09/26/2017 1732   UROBILINOGEN negative 03/12/2015 1619   NITRITE NEGATIVE 09/26/2017 1732   LEUKOCYTESUR NEGATIVE 09/26/2017 1732   LEUKOCYTESUR Negative 07/21/2016 1617     Roosevelt Bisher M.D. [facility] 10/02/2017, 12:13 PM  Pager: [phone] Between 7am to 7pm - call Pager - [phone]  After 7pm go to [email or web address] - password [email or web address]  Call night coverage person covering after 7pm

## 2017-10-03 DIAGNOSIS — Z7982 Long term (current) use of aspirin: Secondary | ICD-10-CM | POA: Diagnosis not present

## 2017-10-03 DIAGNOSIS — N183 Chronic kidney disease, stage 3 (moderate): Secondary | ICD-10-CM | POA: Diagnosis not present

## 2017-10-03 DIAGNOSIS — D649 Anemia, unspecified: Secondary | ICD-10-CM | POA: Diagnosis not present

## 2017-10-03 DIAGNOSIS — M545 Low back pain: Secondary | ICD-10-CM | POA: Diagnosis not present

## 2017-10-03 DIAGNOSIS — E876 Hypokalemia: Secondary | ICD-10-CM | POA: Diagnosis present

## 2017-10-03 DIAGNOSIS — I509 Heart failure, unspecified: Secondary | ICD-10-CM | POA: Diagnosis not present

## 2017-10-03 DIAGNOSIS — E872 Acidosis: Secondary | ICD-10-CM | POA: Diagnosis not present

## 2017-10-03 DIAGNOSIS — F0390 Unspecified dementia without behavioral disturbance: Secondary | ICD-10-CM | POA: Diagnosis present

## 2017-10-03 DIAGNOSIS — I2510 Atherosclerotic heart disease of native coronary artery without angina pectoris: Secondary | ICD-10-CM | POA: Diagnosis present

## 2017-10-03 DIAGNOSIS — M6281 Muscle weakness (generalized): Secondary | ICD-10-CM | POA: Diagnosis not present

## 2017-10-03 DIAGNOSIS — I110 Hypertensive heart disease with heart failure: Secondary | ICD-10-CM | POA: Diagnosis not present

## 2017-10-03 DIAGNOSIS — Z79890 Hormone replacement therapy (postmenopausal): Secondary | ICD-10-CM | POA: Diagnosis not present

## 2017-10-03 DIAGNOSIS — I493 Ventricular premature depolarization: Secondary | ICD-10-CM | POA: Diagnosis present

## 2017-10-03 DIAGNOSIS — L03116 Cellulitis of left lower limb: Secondary | ICD-10-CM | POA: Diagnosis present

## 2017-10-03 DIAGNOSIS — Z9181 History of falling: Secondary | ICD-10-CM | POA: Diagnosis not present

## 2017-10-03 DIAGNOSIS — E785 Hyperlipidemia, unspecified: Secondary | ICD-10-CM | POA: Diagnosis present

## 2017-10-03 DIAGNOSIS — E1140 Type 2 diabetes mellitus with diabetic neuropathy, unspecified: Secondary | ICD-10-CM | POA: Diagnosis present

## 2017-10-03 DIAGNOSIS — Z96659 Presence of unspecified artificial knee joint: Secondary | ICD-10-CM | POA: Diagnosis present

## 2017-10-03 DIAGNOSIS — M729 Fibroblastic disorder, unspecified: Secondary | ICD-10-CM | POA: Diagnosis present

## 2017-10-03 DIAGNOSIS — R41841 Cognitive communication deficit: Secondary | ICD-10-CM | POA: Diagnosis not present

## 2017-10-03 DIAGNOSIS — J9601 Acute respiratory failure with hypoxia: Secondary | ICD-10-CM | POA: Diagnosis present

## 2017-10-03 DIAGNOSIS — L89629 Pressure ulcer of left heel, unspecified stage: Secondary | ICD-10-CM | POA: Diagnosis present

## 2017-10-03 DIAGNOSIS — R0902 Hypoxemia: Secondary | ICD-10-CM | POA: Diagnosis not present

## 2017-10-03 DIAGNOSIS — I5041 Acute combined systolic (congestive) and diastolic (congestive) heart failure: Secondary | ICD-10-CM

## 2017-10-03 DIAGNOSIS — D508 Other iron deficiency anemias: Secondary | ICD-10-CM | POA: Diagnosis not present

## 2017-10-03 DIAGNOSIS — L89899 Pressure ulcer of other site, unspecified stage: Secondary | ICD-10-CM | POA: Diagnosis not present

## 2017-10-03 DIAGNOSIS — R7881 Bacteremia: Secondary | ICD-10-CM | POA: Diagnosis not present

## 2017-10-03 DIAGNOSIS — D509 Iron deficiency anemia, unspecified: Secondary | ICD-10-CM | POA: Diagnosis present

## 2017-10-03 DIAGNOSIS — Z452 Encounter for adjustment and management of vascular access device: Secondary | ICD-10-CM | POA: Diagnosis not present

## 2017-10-03 DIAGNOSIS — K219 Gastro-esophageal reflux disease without esophagitis: Secondary | ICD-10-CM | POA: Diagnosis not present

## 2017-10-03 DIAGNOSIS — E039 Hypothyroidism, unspecified: Secondary | ICD-10-CM | POA: Diagnosis not present

## 2017-10-03 DIAGNOSIS — I130 Hypertensive heart and chronic kidney disease with heart failure and stage 1 through stage 4 chronic kidney disease, or unspecified chronic kidney disease: Secondary | ICD-10-CM | POA: Diagnosis not present

## 2017-10-03 DIAGNOSIS — Z66 Do not resuscitate: Secondary | ICD-10-CM | POA: Diagnosis present

## 2017-10-03 DIAGNOSIS — K5900 Constipation, unspecified: Secondary | ICD-10-CM | POA: Diagnosis not present

## 2017-10-03 DIAGNOSIS — G301 Alzheimer's disease with late onset: Secondary | ICD-10-CM | POA: Diagnosis not present

## 2017-10-03 DIAGNOSIS — M2550 Pain in unspecified joint: Secondary | ICD-10-CM | POA: Diagnosis not present

## 2017-10-03 DIAGNOSIS — B9562 Methicillin resistant Staphylococcus aureus infection as the cause of diseases classified elsewhere: Secondary | ICD-10-CM | POA: Diagnosis not present

## 2017-10-03 DIAGNOSIS — A419 Sepsis, unspecified organism: Secondary | ICD-10-CM | POA: Diagnosis not present

## 2017-10-03 DIAGNOSIS — R609 Edema, unspecified: Secondary | ICD-10-CM | POA: Diagnosis not present

## 2017-10-03 DIAGNOSIS — I10 Essential (primary) hypertension: Secondary | ICD-10-CM | POA: Diagnosis not present

## 2017-10-03 DIAGNOSIS — I272 Pulmonary hypertension, unspecified: Secondary | ICD-10-CM | POA: Diagnosis present

## 2017-10-03 DIAGNOSIS — I5043 Acute on chronic combined systolic (congestive) and diastolic (congestive) heart failure: Secondary | ICD-10-CM | POA: Diagnosis not present

## 2017-10-03 DIAGNOSIS — Z8249 Family history of ischemic heart disease and other diseases of the circulatory system: Secondary | ICD-10-CM | POA: Diagnosis not present

## 2017-10-03 DIAGNOSIS — I959 Hypotension, unspecified: Secondary | ICD-10-CM | POA: Diagnosis not present

## 2017-10-03 DIAGNOSIS — R0602 Shortness of breath: Secondary | ICD-10-CM | POA: Diagnosis not present

## 2017-10-03 DIAGNOSIS — I5042 Chronic combined systolic (congestive) and diastolic (congestive) heart failure: Secondary | ICD-10-CM | POA: Diagnosis not present

## 2017-10-03 DIAGNOSIS — I77810 Thoracic aortic ectasia: Secondary | ICD-10-CM | POA: Diagnosis present

## 2017-10-03 DIAGNOSIS — E538 Deficiency of other specified B group vitamins: Secondary | ICD-10-CM | POA: Diagnosis not present

## 2017-10-03 DIAGNOSIS — F0280 Dementia in other diseases classified elsewhere without behavioral disturbance: Secondary | ICD-10-CM | POA: Diagnosis not present

## 2017-10-03 DIAGNOSIS — R2681 Unsteadiness on feet: Secondary | ICD-10-CM | POA: Diagnosis not present

## 2017-10-03 DIAGNOSIS — I248 Other forms of acute ischemic heart disease: Secondary | ICD-10-CM | POA: Diagnosis present

## 2017-10-03 DIAGNOSIS — Z7401 Bed confinement status: Secondary | ICD-10-CM | POA: Diagnosis not present

## 2017-10-03 DIAGNOSIS — Z951 Presence of aortocoronary bypass graft: Secondary | ICD-10-CM | POA: Diagnosis not present

## 2017-10-03 DIAGNOSIS — G9340 Encephalopathy, unspecified: Secondary | ICD-10-CM | POA: Diagnosis not present

## 2017-10-03 DIAGNOSIS — J309 Allergic rhinitis, unspecified: Secondary | ICD-10-CM | POA: Diagnosis not present

## 2017-10-03 DIAGNOSIS — B9561 Methicillin susceptible Staphylococcus aureus infection as the cause of diseases classified elsewhere: Secondary | ICD-10-CM | POA: Diagnosis present

## 2017-10-03 DIAGNOSIS — N179 Acute kidney failure, unspecified: Secondary | ICD-10-CM | POA: Diagnosis not present

## 2017-10-03 LAB — CULTURE, BLOOD (ROUTINE X 2)
CULTURE: NO GROWTH
CULTURE: NO GROWTH
SPECIAL REQUESTS: ADEQUATE
Special Requests: ADEQUATE

## 2017-10-03 LAB — BASIC METABOLIC PANEL
ANION GAP: 7 (ref 5–15)
BUN: 51 mg/dL — AB (ref 8–23)
CALCIUM: 7.8 mg/dL — AB (ref 8.9–10.3)
CO2: 27 mmol/L (ref 22–32)
CREATININE: 1.46 mg/dL — AB (ref 0.61–1.24)
Chloride: 104 mmol/L (ref 98–111)
GFR calc Af Amer: 45 mL/min — ABNORMAL LOW (ref >60)
GFR calc non Af Amer: 39 mL/min — ABNORMAL LOW (ref >60)
GLUCOSE: 165 mg/dL — AB (ref 70–99)
Potassium: 3.7 mmol/L (ref 3.5–5.1)
Sodium: 138 mmol/L (ref 135–145)

## 2017-10-03 LAB — CBC
HEMATOCRIT: 28.8 % — AB (ref 39.0–52.0)
Hemoglobin: 9.5 g/dL — ABNORMAL LOW (ref 13.0–17.0)
MCH: 33.5 pg (ref 26.0–34.0)
MCHC: 33 g/dL (ref 30.0–36.0)
MCV: 101.4 fL — AB (ref 78.0–100.0)
PLATELETS: 311 10*3/uL (ref 150–400)
RBC: 2.84 MIL/uL — ABNORMAL LOW (ref 4.22–5.81)
RDW: 13.7 % (ref 11.5–15.5)
WBC: 15.6 10*3/uL — AB (ref 4.0–10.5)

## 2017-10-03 LAB — GLUCOSE, CAPILLARY
Glucose-Capillary: 155 mg/dL — ABNORMAL HIGH (ref 70–99)
Glucose-Capillary: 191 mg/dL — ABNORMAL HIGH (ref 70–99)

## 2017-10-03 LAB — MAGNESIUM: Magnesium: 2 mg/dL (ref 1.7–2.4)

## 2017-10-03 MED ORDER — FUROSEMIDE 20 MG PO TABS: 20.0000 mg | ORAL_TABLET | Freq: Every day | ORAL | Status: DC | PRN

## 2017-10-03 MED ORDER — IPRATROPIUM-ALBUTEROL 0.5-2.5 (3) MG/3ML IN SOLN: 3.0000 mL | Freq: Four times a day (QID) | RESPIRATORY_TRACT | Status: DC | PRN

## 2017-10-03 MED ORDER — SENNOSIDES-DOCUSATE SODIUM 8.6-50 MG PO TABS: 1.0000 | ORAL_TABLET | Freq: Every evening | ORAL | Status: AC | PRN

## 2017-10-03 MED ORDER — HYDRALAZINE HCL 25 MG PO TABS: 25.0000 mg | ORAL_TABLET | Freq: Three times a day (TID) | ORAL | Status: DC

## 2017-10-03 MED ORDER — PREDNISONE 10 MG PO TABS: ORAL_TABLET | ORAL | 0 refills | Status: DC

## 2017-10-03 MED ORDER — MOMETASONE FURO-FORMOTEROL FUM 100-5 MCG/ACT IN AERO: 2.0000 | INHALATION_SPRAY | Freq: Two times a day (BID) | RESPIRATORY_TRACT | Status: DC

## 2017-10-03 MED ORDER — METOPROLOL SUCCINATE ER 25 MG PO TB24
25.0000 mg | ORAL_TABLET | Freq: Every day | ORAL | Status: AC
Start: 2017-10-03 — End: ?

## 2017-10-03 MED ORDER — HYDRALAZINE HCL 25 MG PO TABS
25.0000 mg | ORAL_TABLET | Freq: Three times a day (TID) | ORAL | Status: DC
  Administered 2017-10-03: 25 mg via ORAL
  Filled 2017-10-03: qty 1

## 2017-10-03 MED ORDER — CEFAZOLIN IV (FOR PTA / DISCHARGE USE ONLY): 2.0000 g | Freq: Two times a day (BID) | INTRAVENOUS | 0 refills | Status: AC

## 2017-10-03 MED ORDER — ACETAMINOPHEN 325 MG PO TABS: 650.0000 mg | ORAL_TABLET | Freq: Four times a day (QID) | ORAL | Status: AC | PRN

## 2017-10-03 MED ORDER — HEPARIN SOD (PORK) LOCK FLUSH 100 UNIT/ML IV SOLN
250.0000 [IU] | INTRAVENOUS | Status: AC | PRN
  Administered 2017-10-03: 250 [IU]

## 2017-10-03 NOTE — Progress Notes (Signed)
CSW met with patient's daughter at bedside to provide SNF update. [facility] able to accept patient today.   Percell Locus Aerika Groll LCSW [phone]

## 2017-10-03 NOTE — Progress Notes (Addendum)
Progress Note  Patient Name: [name] [name] Date of Encounter: 10/03/2017  Primary Cardiologist: Minus Breeding, MD   Subjective   Breathing resolved. No chest pain. Plan for SNF discharge later today.   Inpatient Medications    Scheduled Meds: . aspirin EC  81 mg Oral Daily  . B-complex with vitamin C  1 tablet Oral QODAY  . donepezil  10 mg Oral QHS  . famotidine  20 mg Oral Daily  . heparin  5,000 Units Subcutaneous Q8H  . insulin aspart  0-15 Units Subcutaneous TID WC  . insulin aspart  0-5 Units Subcutaneous QHS  . levothyroxine  50 mcg Oral QAC breakfast  . metoprolol succinate  25 mg Oral Daily  . mometasone-formoterol  2 puff Inhalation BID  . multivitamin with minerals  1 tablet Oral Daily  . potassium chloride  40 mEq Oral Daily  . predniSONE  40 mg Oral Q breakfast  . sodium chloride flush  10-40 mL Intracatheter Q12H  . sodium chloride flush  3 mL Intravenous Q12H   Continuous Infusions: .  ceFAZolin (ANCEF) IV 2 g (10/03/17 1136)   PRN Meds: acetaminophen **OR** acetaminophen, bisacodyl, fentaNYL (SUBLIMAZE) injection, hydrALAZINE, HYDROcodone-acetaminophen, ipratropium-albuterol, ondansetron **OR** ondansetron (ZOFRAN) IV, senna-docusate   Vital Signs    Vitals:   10/03/17 0729 10/03/17 0738 10/03/17 0827 10/03/17 1137  BP:   (!) 149/64 (!) 170/83  Pulse: (!) 54  (!) 54 70  Resp: 18     Temp:   98.4 F (36.9 C)   TempSrc:   Oral   SpO2: 93% 93% 95%   Weight:   172 lb (78 kg)   Height:        Intake/Output Summary (Last 24 hours) at 10/03/2017 1146 Last data filed at 10/02/2017 1800 Gross per 24 hour  Intake 894.22 ml  Output -  Net 894.22 ml   Filed Weights   10/02/17 0714 10/03/17 0404 10/03/17 0827  Weight: 176 lb (79.8 kg) 172 lb (78 kg) 172 lb (78 kg)    Telemetry    Not on tele currently   ECG    N/A  Physical Exam   GEN: Elderly ill appearing male in no acute distress.   Neck: No JVD Cardiac: RRR, no murmurs, rubs, or  gallops.  Respiratory: course breath sound with wheezing  GI: Soft, nontender, non-distended  MS: No edema; No deformity. Neuro:  Nonfocal  Psych: Normal affect   Labs    Chemistry Recent Labs  Lab 09/26/17 1551 09/27/17 0042  10/01/17 0641 10/02/17 0433 10/03/17 0416  NA 139 137   < > 137 137 138  K 4.7 4.2   < > 3.1* 3.2* 3.7  CL 102 106   < > 103 105 104  CO2 19* 17*   < > 22 24 27   GLUCOSE 207* 262*   < > 227* 216* 165*  BUN 31* 31*   < > 40* 47* 51*  CREATININE 1.82* 1.69*   < > 1.36* 1.61* 1.46*  CALCIUM 8.5* 7.7*   < > 8.0* 7.8* 7.8*  PROT 5.9* 5.3*  --   --   --   --   ALBUMIN 2.9* 2.5*  --   --   --   --   AST 32 29  --   --   --   --   ALT 26 23  --   --   --   --   ALKPHOS 66 55  --   --   --   --  BILITOT 1.8* 1.4*  --   --   --   --   GFRNONAA 30* 32*   < > 42* 34* 39*  GFRAA 34* 38*   < > 49* 40* 45*  ANIONGAP 18* 14   < > 12 8 7    < > = values in this interval not displayed.     Hematology Recent Labs  Lab 10/01/17 0641 10/02/17 0433 10/03/17 0416  WBC 11.0* 15.2* 15.6*  RBC 3.22* 2.91* 2.84*  HGB 10.8* 9.5* 9.5*  HCT 32.7* 28.5* 28.8*  MCV 101.6* 97.9 101.4*  MCH 33.5 32.6 33.5  MCHC 33.0 33.3 33.0  RDW 13.3 13.2 13.7  PLT 284 305 311    Cardiac EnzymesNo results for input(s): TROPONINI in the last 168 hours. No results for input(s): TROPIPOC in the last 168 hours.   BNP Recent Labs  Lab 09/29/17 0942  BNP 3,317.6*     DDimer No results for input(s): DDIMER in the last 168 hours.   Radiology    Dg Chest Port 1 View  Result Date: 10/02/2017 CLINICAL DATA:  PICC placement EXAM: PORTABLE CHEST 1 VIEW COMPARISON:  09/29/2017 chest radiograph. FINDINGS: Right PICC terminates in the lower third of the SVC. Stable configuration of intact sternotomy wires and CABG clips. Stable cardiomediastinal silhouette with mild cardiomegaly. No pneumothorax. No significant right pleural effusion. Stable small left pleural effusion. Borderline mild  pulmonary edema, slightly decreased. Mild left basilar opacity, slightly decreased. IMPRESSION: 1. Right PICC terminates in the lower third of the SVC. 2. Stable mild cardiomegaly with borderline mild pulmonary edema, slightly improved. 3. Stable small left pleural effusion with slightly decreased mild left basilar opacity, favor atelectasis. Electronically Signed   By: Ilona Sorrel M.D.   On: 10/02/2017 09:53    Cardiac Studies   Echo 09/27/17: Study Conclusions  - Left ventricle: The cavity size was normal. Wall thickness was increased in a pattern of mild LVH. Systolic function was severely reduced. The estimated ejection fraction was in the range of 25% to 30%. Diffuse hypokinesis. Doppler parameters are consistent with abnormal left ventricular relaxation (grade 1 diastolic dysfunction). - Aortic valve: There was mild regurgitation. - Aortic root: The aortic root was mildly dilated. - Mitral valve: There was mild regurgitation. - Left atrium: The atrium was mildly dilated. - Right ventricle: The cavity size was mildly dilated. - Pulmonary arteries: Systolic pressure was mildly increased. PA peak pressure: 42 mm Hg (S).  Impressions:  - Severe LV dysfunction; mild diastolic dysfunction; mild LVH; mild AI; mildly dilated aortic root; mild MR; mild LAE; mild RVE; mild TR with mild pulmonary hypertension.    Patient Profile     84 y.o. male with a PMH of CAD s/p CABG 1998, HTN, HLD, DM type 2, hypothyroidism, dementiawho seen for the evaluation of acute combined CHF.    Assessment & Plan    1. Acute combined SEG:BTDVVOH presented with sepsis 2/2 MSSA bacteremia and LLE cellulitis. - He developed volume overload with BNP of 3317 after IV hydration at presenation.  -  Echo 09/27/17 with EF 25-30% withG1DD, anddiffuse hypokinesis (previous echo 2011 with EF 50-55%). Last ischemic evaluation was a NST 2011 which was without ischemia.Possible he has had an  ischemic event vs progression of disease with subsequent drop in EF.  Does not appear to have significant tachyarrhythmias either to contribute to his low EF. Family is not interested in invasive testing such as a LHC, nor would he be a good candidate given advanced  age and dementia. Favor medical management. - treated with IV lasix with improved symptoms. Now on hold due to worsen renal function. Improving Scr today.  - Continue metoprolol XL-reduceed dose to 25 mg daily given bradycardia - Monitor electrolytes closely and replete as needed: K>4; Mg >2 -Euvolemic. Consider PRN vs low dose daily lasix for dyspnea.   2. CAD s/p CABG 1998:patient has been without anginal complaints. Last ischemic evaluation was a NST 2011 which was without ischemia.  - Continue ASA and metoprolol - Not on statin therapy due to age  58. HTN:BP intermittently elevated - Continue metoprolol XL - unable to titrate given bradycardia -Consider restarting home irbesartan if Cr improves to baseline 1.2 - Add hydralzine 25mg  TID  4. TDV:VOHY LDL 64 04/2017; at goal of <70 - No need for statin therapy  7. CKD stage WVP:XTGGYIRS to 1.46 today.    [facility] will sign off.   Medication Recommendations:  Continue current medications ASA, Toprol XL and hydralzine (DC when restart ARB) Other recommendations (labs, testing, etc):  BMET in few days at facility. PRN vs low dose daily lasix. Restart  irbesartan if Cr improves to baseline 1.2 Follow up as an outpatient:  arranged   For questions or updates, please contact [facility] Please consult [email or web address] for contact info under Cardiology/STEMI.      SignedLeanor Kail, PA  10/03/2017, 11:46 AM    Attending Addendum:  History and all data above reviewed.  Patient examined.  I agree with the findings as above.  All available labs, radiology testing, previous records reviewed. Agree with documented assessment and plan. Mr. Chenoweth is a 20M with  CAD s/p CABG, hypertension, hyperlipidemia, hypothyroidism, and dementia here with acute on chronic systolic and diastolic heart failure.  He was admitted with MSSA bacteremia and cellulitis.  With fluid resuscitation he became volume overloaded.  He has subsequently been diuresed and is doing much better.  He appears to be euvolemic today.  His systolic dysfunction is new.  Given his dementia and age family is not interested in aggressive interventions or testing.  He developed acute on chronic renal failure and creatinine is now downtrending.  His home irbesartan has been held.  Metoprolol was also reduced due to bradycardia.  Today his blood pressure is increasing and his systolic is in the 854O.  We will start hydralazine 25 mg 3 times daily.  Once his creatinine comes back down to his baseline of 1.2 this can be discontinued and irbesartan can be restarted.  We have arranged for outpatient follow-up.  He is stable for discharge from a cardiac standpoint.  Treasa Bradshaw C. Oval Linsey, MD, [facility]  10/03/2017 12:50 PM

## 2017-10-03 NOTE — Care Management Important Message (Signed)
Important Message  Patient Details  Name: [name] [name] MRN: [id number] Date of Birth: 11-07-1918   Medicare Important Message Given:  Yes    [name] Montine Circle 10/03/2017, 4:27 PM

## 2017-10-03 NOTE — Progress Notes (Signed)
Patient will DC to: [facility] Anticipated DC date: 10/03/17 Family notified: Daughter, Market researcher by: Corey Harold   Per MD patient ready for DC to [facility]. RN, patient, patient's family, and facility notified of DC. Discharge Summary sent to facility. RN given number for report [phone]). DC packet on chart. Ambulance transport requested for patient.   CSW signing off.  [name] Fishman, LCSW Clinical Social Worker [phone]

## 2017-10-03 NOTE — Progress Notes (Signed)
[name] [name] to be D/C'd Skilled nursing facility per MD order.  Discussed with the patient and all questions fully answered.  Report called to Phillis , Therapist, sports at [facility]. All questions answered.  Pt D/C'd with [facility].   [name] 10/03/2017 3:04 PM

## 2017-10-03 NOTE — Clinical Social Work Placement (Signed)
   CLINICAL SOCIAL WORK PLACEMENT  NOTE  Date:  10/03/2017  Patient Details  Name: [name] [name] MRN: [id number] Date of Birth: March 27, 1919  Clinical Social Work is seeking post-discharge placement for this patient at the [place] level of care (*CSW will initial, date and re-position this form in  chart as items are completed):  Yes   Patient/family provided with [place] Work Department's list of facilities offering this level of care within the geographic area requested by the patient (or if unable, by the patient's family).  Yes   Patient/family informed of their freedom to choose among providers that offer the needed level of care, that participate in Medicare, Medicaid or managed care program needed by the patient, have an available bed and are willing to accept the patient.  Yes   Patient/family informed of [facility]'s ownership interest in [facility] and [facility], as well as of the fact that they are under no obligation to receive care at these facilities.  PASRR submitted to EDS on       PASRR number received on       Existing PASRR number confirmed on 09/28/17     FL2 transmitted to all facilities in geographic area requested by pt/family on 09/28/17     FL2 transmitted to all facilities within larger geographic area on       Patient informed that his/her managed care company has contracts with or will negotiate with certain facilities, including the following:        Yes   Patient/family informed of bed offers received.  Patient chooses bed at [facility]     Physician recommends and patient chooses bed at      Patient to be transferred to [facility] on 10/03/17.  Patient to be transferred to facility by [facility]     Patient family notified on 10/03/17 of transfer.  Name of family member notified:  Karna Christmas, daughter      PHYSICIAN Please prepare priority discharge summary, including medications      Additional Comment:    _______________________________________________ Benard Halsted, [place] 10/03/2017, 12:00 PM

## 2017-10-03 NOTE — Discharge Summary (Signed)
Physician Discharge Summary   Patient ID: [name] [name] MRN: [id number] DOB/AGE: 1918/05/29 84 y.o.  Admit date: 09/26/2017 Discharge date: 10/03/2017  Primary Care Physician:  Claretta Fraise, MD   Recommendations for Outpatient Follow-up:  1. Follow up with PCP in 1-2 weeks 2. Please obtain BMP in one week 3. Restart irbesartan when creatinine is 1.2 or less.  Discontinue hydralazine once patient is started on it irbesartan 4. IV cefazolin till November 09, 2017 5. Lasix 21m daily PRN for edema, shortness of beath   Home Health: Patient is being discharged to skilled nursing facility Equipment/Devices: None  Discharge Condition: stable CODE STATUS:  DNR   Diet recommendation: Heart healthy carb modified   Discharge Diagnoses:    . Sepsis secondary to MSSA bacteremia . Pressure injury of deep tissue of left foot, neuropathic ulcers  . Acute on chronic systolic and diastolic CHF exacerbation . Acute hypoxic respiratory failure . Acute metabolic encephalopathy superimposed on dementia  . Diabetes mellitus type 2 . Mild acute on chronic kidney disease stage III with lactic acidosis . Late onset Alzheimer's disease without behavioral disturbance . Hypothyroidism . HTN (hypertension) . CAD (coronary artery disease) . Macrocytic anemia . CKD (chronic kidney disease), stage III (HCC)    Consults:   -Infectious disease Cardiology    Allergies:  No Known Allergies   DISCHARGE MEDICATIONS: Allergies as of 10/03/2017   No Known Allergies     Medication List    STOP taking these medications   etodolac 200 MG capsule Commonly known as:  LODINE   irbesartan 300 MG tablet Commonly known as:  AVAPRO     TAKE these medications   acetaminophen 325 MG tablet Commonly known as:  TYLENOL Take 2 tablets (650 mg total) by mouth every 6 (six) hours as needed for mild pain (or Fever >/= 101).   aspirin 81 MG tablet Take 81 mg by mouth daily.   b complex vitamins  capsule Take 1 capsule by mouth every other day.   ceFAZolin IVPB Commonly known as:  ANCEF Inject 2 g into the vein every 12 (twelve) hours. Indication:  MSSA bacteremia Last Day of Therapy:  November 09, 2017 Labs - Once weekly:  CBC/D and BMP, Labs - Every other week:  ESR and CRP   donepezil 10 MG tablet Commonly known as:  ARICEPT TAKE 1 TABLET BY MOUTH EVERYDAY AT BEDTIME   famotidine 20 MG tablet Commonly known as:  PEPCID Take 1 tablet (20 mg total) by mouth 2 (two) times daily. What changed:  when to take this   fluticasone 50 MCG/ACT nasal spray Commonly known as:  FLONASE Place 2 sprays into both nostrils daily. What changed:    when to take this  reasons to take this   furosemide 20 MG tablet Commonly known as:  LASIX Take 1 tablet (20 mg total) by mouth daily as needed for fluid or edema.   glimepiride 2 MG tablet Commonly known as:  AMARYL TAKE 1 TABLET (2 MG TOTAL) BY MOUTH DAILY BEFORE BREAKFAST.   hydrALAZINE 25 MG tablet Commonly known as:  APRESOLINE Take 1 tablet (25 mg total) by mouth every 8 (eight) hours.   ipratropium-albuterol 0.5-2.5 (3) MG/3ML Soln Commonly known as:  DUONEB Take 3 mLs by nebulization every 6 (six) hours as needed.   levothyroxine 50 MCG tablet Commonly known as:  SYNTHROID, LEVOTHROID TAKE 1 TABLET (50 MCG TOTAL) BY MOUTH DAILY.   metoprolol succinate 25 MG 24 hr tablet Commonly known as:  TOPROL-XL Take 1 tablet (25 mg total) by mouth daily. What changed:    medication strength  how much to take  how to take this  when to take this  additional instructions   mometasone-formoterol 100-5 MCG/ACT Aero Commonly known as:  DULERA Inhale 2 puffs into the lungs 2 (two) times daily.   multivitamin tablet Take 1 tablet by mouth daily.   predniSONE 10 MG tablet Commonly known as:  DELTASONE Prednisone dosing: Take  Prednisone 71m (3 tabs) x 2 days, then 258m(2 tabs) x 2 days, then 1074m1 tab) x 2 days, then  OFF.   senna-docusate 8.6-50 MG tablet Commonly known as:  Senokot-S Take 1 tablet by mouth at bedtime as needed for mild constipation.            Home Infusion Instuctions  (From admission, onward)        Start     Ordered   10/03/17 0000  Home infusion instructions [facility] May follow [facility] Protocol; May administer Cathflo as needed to maintain patency of vascular access device.; Flushing of vascular access device: [facility]: 0.9% NaCl pre/post medica...    Question Answer Comment  Instructions [facility] follow [facility] Protocol   Instructions May administer Cathflo as needed to maintain patency of vascular access device.   Instructions Flushing of vascular access device: [facility]: 0.9% NaCl pre/post medication administration and prn patency; Heparin 100 u/ml, 5ml5mr implanted ports and Heparin 10u/ml, 5ml 22m all other central venous catheters.   Instructions May follow [facility] Anaphylaxis Protocol for First Dose Administration in the home: 0.9% NaCl at 25-50 ml/hr to maintain IV access for protocol meds. Epinephrine 0.3 ml IV/IM PRN and Benadryl 25-50 IV/IM PRN s/s of anaphylaxis.   Instructions [facility] Infusion Coordinator (RN) to assist per patient IV care needs in the home PRN.      10/03/17 0941       Brief H and P: For complete details please refer to admission H and P, but in brief [name] [name] y.[id number] medical history significant fordementia, hypertension, hypothyroidism, coronary artery disease, and type 2 diabetes mellitus, now presenting to the emergency department for evaluation of fatigue and low back pain. Patient lives at [place] who also has dementia. They have caregivers come up about 8 hours a day for assistance. He wasnoted to be sleeping most of the day for the past couple days. He had suffered a ground-level mechanical fall a few days ago and had been complaining of low back pain  since then. He has not been complaining of abdominal pain and there has not been any diarrhea or vomiting reported.  Patient was admitted for further work-up.  Hospital Course:   Severe sepsis secondary to MSSA bacteremia, nonhealing neuropathic ulcers to the left foot -Unclear source, possibly due to left foot, UA negative for UTI. -2D echo showed EF of 25 to 30% but no obvious vegetations -MRI of the left foot showed diffuse cellulitis and mild fasciitis but no septic arthritis or osteomyelitis.  Lumbar spine x-ray showed moderate to advanced degenerative disc and facet disease but no acute findings -Infectious disease was consulted.  Family declined invasive testing, 2D echo did not show acute endocarditis.  Decision has been made to pursue IV antibiotics for 6 weeks as family has declined MRI of the spine or TEE. -PICC line was placed on 7/7, plan to continue IV cefazolin till August 14,2019  Acute on chronic systolic and diastolic CHF exacerbation with pulmonary edema, acute hypoxic respiratory failure - 2D echo 7/2 showed EF of 25 to 30%, with diffuse hypokinesis.   -Continue Toprol-XL, no ACEI or ARB secondary to renal insufficiency.  - Started on hydralazine, once creatinine is 1.2 or lower, patient may be restarted on irbesartan and hydralazine can be discontinued -Creatinine improving, 1.4 today.  Patient had received IV Lasix for diuresis while inpatient.  Likely volume overload due to IV fluids at the time of presentation for sepsis. -Also received nebulizer treatments for acute bronchitis, prednisone with taper - cardiology recommended oral lasix 26m daily PRN   Acute encephalopathy superimposed on dementia -Continue Aricept, -Family requested skilled nursing facility    Diabetes mellitus type 2, uncontrolled with hyperglycemia, NIDDM -Amaryl was on hold during hospitalization and patient was placed on sliding scale insulin. -Hemoglobin A1c 5.4 on 09/13/2017.   Hyperglycemia likely due to steroids.  - Resume amaryl  Essential hypertension with bradycardia -BP stable, continue Toprol-XL -Toprol-XL was decreased to 25 mg daily   History of coronary artery disease -Continue aspirin, Toprol-XL -2D echo showed EF of 25 to 30% with grade 1 diastolic dysfunction, diffuse hypokinesis  Hypothyroidism -Continue Synthroid  Mild acute on chronic kidney disease stage III with lactic acidosis -Baseline creatinine 1.2-1.5 -Creatinine trended up to 1.6 hence Lasix was held, now down to 1.4 today     Day of Discharge S: No complaints, no chest pain or shortness of breath.  BP (!) 177/89 (BP Location: Left Arm)   Pulse 75   Temp 98.1 F (36.7 C) (Oral)   Resp 18   Ht 5' 9"  (1.753 m)   Wt 78 kg (172 lb)   SpO2 96%   BMI 25.40 kg/m   Physical Exam: General: Alert and awake oriented to self, dementia, NAD HEENT: anicteric sclera, pupils reactive to light and accommodation CVS: S1-S2 clear no murmur rubs or gallops Chest: clear to auscultation bilaterally, no wheezing rales or rhonchi Abdomen: soft nontender, nondistended, normal bowel sounds Extremities: no cyanosis, clubbing or edema noted bilaterally Neuro: no new FND   The results of significant diagnostics from this hospitalization (including imaging, microbiology, ancillary and laboratory) are listed below for reference.      Procedures/Studies: 2D echo Study Conclusions  - Left ventricle: The cavity size was normal. Wall thickness was   increased in a pattern of mild LVH. Systolic function was   severely reduced. The estimated ejection fraction was in the   range of 25% to 30%. Diffuse hypokinesis. Doppler parameters are   consistent with abnormal left ventricular relaxation (grade 1   diastolic dysfunction). - Aortic valve: There was mild regurgitation. - Aortic root: The aortic root was mildly dilated. - Mitral valve: There was mild regurgitation. - Left atrium: The  atrium was mildly dilated. - Right ventricle: The cavity size was mildly dilated. - Pulmonary arteries: Systolic pressure was mildly increased. PA   peak pressure: 42 mm Hg (S).   Ct Abdomen Pelvis Wo Contrast  Result Date: 09/26/2017 CLINICAL DATA:  Fall with low back pain EXAM: CT ABDOMEN AND PELVIS WITHOUT CONTRAST TECHNIQUE: Multidetector CT imaging of the abdomen and pelvis was performed following the standard protocol without IV contrast. COMPARISON:  01/10/2013 CT FINDINGS: Lower chest: Lung bases demonstrate small pleural effusions. No consolidation. Cardiomegaly. Coronary vascular calcification. Hepatobiliary: No focal liver abnormality is seen. No gallstones, gallbladder wall thickening, or biliary dilatation. Pancreas: Unremarkable. No pancreatic ductal dilatation or surrounding inflammatory changes. Spleen:  Normal in size without focal abnormality. Adrenals/Urinary Tract: Adrenal glands are unremarkable. Kidneys are normal, without renal calculi, focal lesion, or hydronephrosis. Bladder is unremarkable. Stomach/Bowel: Stomach is within normal limits. Interval appendectomy. No evidence of bowel wall thickening, distention, or inflammatory changes. Vascular/Lymphatic: Moderate aortic atherosclerosis. No aneurysmal dilatation. No significantly enlarged lymph nodes Reproductive: Prostate is unremarkable. Other: Negative for free air or free fluid. Small fat in the inguinal canals. Musculoskeletal: Scoliosis of the spine. Trace retrolisthesis L1 on L2 and L2 on L3. Multiple level degenerative change. No acute osseous abnormality. IMPRESSION: 1. No CT evidence for acute intra-abdominal or pelvic abnormality. 2. Cardiomegaly.  Small pleural effusions. Electronically Signed   By: Donavan Foil M.D.   On: 09/26/2017 19:42   Dg Chest 2 View  Result Date: 09/26/2017 CLINICAL DATA:  [place] 2 days ago at home, fever, history coronary disease post CABG, hypertension, diabetes mellitus, diabetic neuropathy and  medial LEFT foot wound EXAM: CHEST - 2 VIEW COMPARISON:  01/10/2013 FINDINGS: Enlargement of cardiac silhouette post CABG. Mediastinal contours and pulmonary vascularity normal. Atherosclerotic calcification aorta. Decreased lung volumes with mild bibasilar atelectasis. Remaining lungs clear. No pleural effusion or pneumothorax. Bones demineralized. IMPRESSION: Enlargement of cardiac silhouette post CABG. Bibasilar atelectasis. Electronically Signed   By: Lavonia Dana M.D.   On: 09/26/2017 15:02   Dg Lumbar Spine Complete  Result Date: 09/29/2017 CLINICAL DATA:  Bacteremia.  Back pain EXAM: LUMBAR SPINE - COMPLETE 4+ VIEW COMPARISON:  CT abdomen and pelvis 09/26/2017 FINDINGS: Moderate to advanced diffuse degenerative disc disease, most pronounced at L1-2 and L2-3 with disc space narrowing, spurring, endplate sclerosis and vacuum disc. Diffuse degenerative facet disease, most pronounced in the lower lumbar spine. Slight anterolisthesis of L4 on L5 related to facet disease. No fracture. SI joints are symmetric and unremarkable. IMPRESSION: Moderate to advanced degenerative disc and facet disease. No acute findings. Electronically Signed   By: Rolm Baptise M.D.   On: 09/29/2017 15:59   Mr Foot Left Wo Contrast  Result Date: 09/29/2017 CLINICAL DATA:  Sepsis. Foot ulcers mainly along the medial base of the big toe. EXAM: MRI OF THE LEFT FOOT WITHOUT CONTRAST TECHNIQUE: Multiplanar, multisequence MR imaging of the left foot was performed. No intravenous contrast was administered. COMPARISON:  Radiograph 09/26/2017 FINDINGS: Very limited examination.  The patient would not hold still. Motion degraded images with poor distal fat saturation make it very difficult to evaluate for osteomyelitis. I do not see any obvious T1 signal abnormality to suggest osteomyelitis. There is diffuse cellulitis and myofasciitis without obvious soft tissue abscess. There appears to be a skin blister along the lateral aspect of the foot  near the fifth toe. IMPRESSION: Very limited examination but no obvious findings for septic arthritis or osteomyelitis. Diffuse cellulitis and myofasciitis. Electronically Signed   By: Marijo Sanes M.D.   On: 09/29/2017 16:44   Dg Chest Port 1 View  Result Date: 10/02/2017 CLINICAL DATA:  PICC placement EXAM: PORTABLE CHEST 1 VIEW COMPARISON:  09/29/2017 chest radiograph. FINDINGS: Right PICC terminates in the lower third of the SVC. Stable configuration of intact sternotomy wires and CABG clips. Stable cardiomediastinal silhouette with mild cardiomegaly. No pneumothorax. No significant right pleural effusion. Stable small left pleural effusion. Borderline mild pulmonary edema, slightly decreased. Mild left basilar opacity, slightly decreased. IMPRESSION: 1. Right PICC terminates in the lower third of the SVC. 2. Stable mild cardiomegaly with borderline mild pulmonary edema, slightly improved. 3. Stable small left pleural effusion with slightly decreased mild left  basilar opacity, favor atelectasis. Electronically Signed   By: Ilona Sorrel M.D.   On: 10/02/2017 09:53   Dg Chest Port 1 View  Result Date: 09/29/2017 CLINICAL DATA:  Dyspnea EXAM: PORTABLE CHEST 1 VIEW COMPARISON:  09/26/2017; 12/31/2012; 04/02/2009 FINDINGS: Grossly unchanged enlarged cardiac silhouette and mediastinal contours with atherosclerotic plaque within a tortuous and potentially ectatic thoracic aorta. Post median sternotomy and CABG. Pulmonary vasculature appears less distinct on the present examination with cephalization of flow. Suspected trace layering bilateral effusions. Worsening bibasilar heterogeneous/consolidative opacities, left greater than right. No pneumothorax. No acute osseus abnormalities. IMPRESSION: Findings most suggestive of pulmonary edema with suspected trace bilateral effusions and associated bibasilar opacities, atelectasis versus infiltrate. Electronically Signed   By: Sandi Mariscal M.D.   On: 09/29/2017 09:05    Dg Foot Complete Left  Result Date: 09/26/2017 CLINICAL DATA:  [place].  Pain.  Medial wound. EXAM: LEFT FOOT - COMPLETE 3+ VIEW COMPARISON:  None. FINDINGS: There is no evidence of fracture or dislocation. There is no evidence of arthropathy or other focal bone abnormality. Soft tissues are unremarkable. IMPRESSION: Negative. Electronically Signed   By: Staci Righter M.D.   On: 09/26/2017 15:01   [place] Ekg Site Rite  Result Date: 10/01/2017 If Site Rite image not attached, placement could not be confirmed due to current cardiac rhythm.     LAB RESULTS: Basic Metabolic Panel: Recent Labs  Lab 10/02/17 0433 10/03/17 0416  NA 137 138  K 3.2* 3.7  CL 105 104  CO2 24 27  GLUCOSE 216* 165*  BUN 47* 51*  CREATININE 1.61* 1.46*  CALCIUM 7.8* 7.8*  MG  --  2.0   Liver Function Tests: Recent Labs  Lab 09/26/17 1551 09/27/17 0042  AST 32 29  ALT 26 23  ALKPHOS 66 55  BILITOT 1.8* 1.4*  PROT 5.9* 5.3*  ALBUMIN 2.9* 2.5*   No results for input(s): LIPASE, AMYLASE in the last 168 hours. No results for input(s): AMMONIA in the last 168 hours. CBC: Recent Labs  Lab 09/27/17 0042  10/02/17 0433 10/03/17 0416  WBC 18.9*   < > 15.2* 15.6*  NEUTROABS 17.7*  --   --   --   HGB 9.7*   < > 9.5* 9.5*  HCT 29.8*   < > 28.5* 28.8*  MCV 103.5*   < > 97.9 101.4*  PLT 177   < > 305 311   < > = values in this interval not displayed.   Cardiac Enzymes: Recent Labs  Lab 09/26/17 2212  CKTOTAL 113   BNP: Invalid input(s): POCBNP CBG: Recent Labs  Lab 10/03/17 0852 10/03/17 1253  GLUCAP 155* 191*      Disposition and Follow-up: Discharge Instructions    Diet - low sodium heart healthy   Complete by:  As directed    Home infusion instructions [facility] May follow [place] Dosing Protocol; May administer Cathflo as needed to maintain patency of vascular access device.; Flushing of vascular access device: per [facility] Protocol: 0.9% NaCl pre/post medica...   Complete by:   As directed    Instructions:  May follow [place] Dosing Protocol   Instructions:  May administer Cathflo as needed to maintain patency of vascular access device.   Instructions:  Flushing of vascular access device: per [facility] Protocol: 0.9% NaCl pre/post medication administration and prn patency; Heparin 100 u/ml, 84m for implanted ports and Heparin 10u/ml, 569mfor all other central venous catheters.   Instructions:  [facility]  Anaphylaxis Protocol for First Dose Administration in the home: 0.9% NaCl at 25-50 ml/hr to maintain IV access for protocol meds. Epinephrine 0.3 ml IV/IM PRN and Benadryl 25-50 IV/IM PRN s/s of anaphylaxis.   Instructions:  [facility] Infusion Coordinator (RN) to assist per patient IV care needs in the home PRN.   Increase activity slowly   Complete by:  As directed        DISPOSITION: [place] information for follow-up providers    Claretta Fraise, MD. Schedule an appointment as soon as possible for a visit in 2 week(s).   Specialty:  Family Medicine Contact information: [place] [address] [phone]        Minus Breeding, MD. Go on 11/23/2017.   Specialty:  Cardiology Why:  @2 :20pm for follow up. call if needed early appointment  Contact information: [place] [address] [phone]        Dudley Major, MD. Schedule an appointment as soon as possible for a visit in 4 week(s).   Specialty:  Internal Medicine Contact information: [address] [place] [address] [phone]            Contact information for after-discharge care    Destination    [facility] .   Service:  Skilled Nursing Contact information: [address] [place] [phone]                   Time coordinating discharge:  40 minutes  Signed:   Estill Cotta M.D. [facility] 10/03/2017, 1:29  PM Pager: [phone]

## 2017-10-04 NOTE — Consult Note (Signed)
            [facility] CM Primary Care Navigator  10/04/2017  Chandlar Guice [name] [name] 06-Aug-1918 [id number]   Martin Majestic to see patient at the bedside to identify possible discharge needs buthe wasalreadydischarged per staff. Patientwas discharged to skilled nursing facility [facility]) per therapy recommendation.  Per chart review, patient was admitted for severe sepsis secondary to bacteremia, non-healing ulcers to left foot; acute on chronic congestive HF, acute hypoxic respiratory failure.  Primary care provider's office is listed as providing transition of care (TOC) follow-up.  Patient has discharge instruction to follow-up with primary care provider in 1- 2 weeks post discharge.   For additional questions please contact:  Edwena Felty A. Kiaraliz Rafuse, BSN, RN-BC [facility] PRIMARY CARE Navigator Cell: [phone]

## 2017-10-05 DIAGNOSIS — B9562 Methicillin resistant Staphylococcus aureus infection as the cause of diseases classified elsewhere: Secondary | ICD-10-CM | POA: Diagnosis not present

## 2017-10-05 DIAGNOSIS — A419 Sepsis, unspecified organism: Secondary | ICD-10-CM | POA: Diagnosis not present

## 2017-10-05 DIAGNOSIS — I5042 Chronic combined systolic (congestive) and diastolic (congestive) heart failure: Secondary | ICD-10-CM | POA: Diagnosis not present

## 2017-10-05 DIAGNOSIS — E1140 Type 2 diabetes mellitus with diabetic neuropathy, unspecified: Secondary | ICD-10-CM | POA: Diagnosis not present

## 2017-10-10 ENCOUNTER — Encounter: Payer: Self-pay | Admitting: Family

## 2017-10-17 ENCOUNTER — Encounter: Payer: Self-pay | Admitting: Family

## 2017-10-19 ENCOUNTER — Encounter: Payer: Self-pay | Admitting: Family

## 2017-10-24 ENCOUNTER — Encounter: Payer: Self-pay | Admitting: Family

## 2017-10-26 DIAGNOSIS — B9562 Methicillin resistant Staphylococcus aureus infection as the cause of diseases classified elsewhere: Secondary | ICD-10-CM | POA: Diagnosis not present

## 2017-10-26 DIAGNOSIS — A419 Sepsis, unspecified organism: Secondary | ICD-10-CM | POA: Diagnosis not present

## 2017-10-26 DIAGNOSIS — E1140 Type 2 diabetes mellitus with diabetic neuropathy, unspecified: Secondary | ICD-10-CM | POA: Diagnosis not present

## 2017-10-26 DIAGNOSIS — F0280 Dementia in other diseases classified elsewhere without behavioral disturbance: Secondary | ICD-10-CM | POA: Diagnosis not present

## 2017-10-28 ENCOUNTER — Other Ambulatory Visit: Payer: Self-pay | Admitting: Pharmacist

## 2017-10-28 NOTE — Progress Notes (Signed)
[facility] pharmacy lab review  

## 2017-10-31 ENCOUNTER — Other Ambulatory Visit: Payer: Self-pay

## 2017-10-31 ENCOUNTER — Encounter (HOSPITAL_COMMUNITY): Payer: Self-pay

## 2017-10-31 ENCOUNTER — Inpatient Hospital Stay (HOSPITAL_COMMUNITY)
Admission: EM | Admit: 2017-10-31 | Discharge: 2017-11-03 | DRG: 291 | Disposition: A | Discharge status: Skilled Nursing Facility | Payer: Medicare Other | Attending: Family Medicine | Admitting: Family Medicine

## 2017-10-31 ENCOUNTER — Emergency Department (HOSPITAL_COMMUNITY): Payer: Medicare Other

## 2017-10-31 ENCOUNTER — Encounter: Payer: Self-pay | Admitting: Family

## 2017-10-31 DIAGNOSIS — R41841 Cognitive communication deficit: Secondary | ICD-10-CM | POA: Diagnosis not present

## 2017-10-31 DIAGNOSIS — N183 Chronic kidney disease, stage 3 (moderate): Secondary | ICD-10-CM | POA: Diagnosis not present

## 2017-10-31 DIAGNOSIS — J9601 Acute respiratory failure with hypoxia: Secondary | ICD-10-CM | POA: Diagnosis present

## 2017-10-31 DIAGNOSIS — Z66 Do not resuscitate: Secondary | ICD-10-CM | POA: Diagnosis not present

## 2017-10-31 DIAGNOSIS — I272 Pulmonary hypertension, unspecified: Secondary | ICD-10-CM | POA: Diagnosis present

## 2017-10-31 DIAGNOSIS — R7881 Bacteremia: Secondary | ICD-10-CM | POA: Diagnosis present

## 2017-10-31 DIAGNOSIS — I77810 Thoracic aortic ectasia: Secondary | ICD-10-CM | POA: Diagnosis present

## 2017-10-31 DIAGNOSIS — D509 Iron deficiency anemia, unspecified: Secondary | ICD-10-CM | POA: Diagnosis present

## 2017-10-31 DIAGNOSIS — B9561 Methicillin susceptible Staphylococcus aureus infection as the cause of diseases classified elsewhere: Secondary | ICD-10-CM | POA: Diagnosis present

## 2017-10-31 DIAGNOSIS — I5043 Acute on chronic combined systolic (congestive) and diastolic (congestive) heart failure: Secondary | ICD-10-CM | POA: Diagnosis present

## 2017-10-31 DIAGNOSIS — A419 Sepsis, unspecified organism: Secondary | ICD-10-CM | POA: Diagnosis not present

## 2017-10-31 DIAGNOSIS — Z9181 History of falling: Secondary | ICD-10-CM | POA: Diagnosis not present

## 2017-10-31 DIAGNOSIS — I5042 Chronic combined systolic (congestive) and diastolic (congestive) heart failure: Secondary | ICD-10-CM | POA: Diagnosis not present

## 2017-10-31 DIAGNOSIS — I493 Ventricular premature depolarization: Secondary | ICD-10-CM | POA: Diagnosis present

## 2017-10-31 DIAGNOSIS — L03116 Cellulitis of left lower limb: Secondary | ICD-10-CM | POA: Diagnosis present

## 2017-10-31 DIAGNOSIS — G301 Alzheimer's disease with late onset: Secondary | ICD-10-CM | POA: Diagnosis not present

## 2017-10-31 DIAGNOSIS — Z96659 Presence of unspecified artificial knee joint: Secondary | ICD-10-CM | POA: Diagnosis present

## 2017-10-31 DIAGNOSIS — Z7982 Long term (current) use of aspirin: Secondary | ICD-10-CM

## 2017-10-31 DIAGNOSIS — I248 Other forms of acute ischemic heart disease: Secondary | ICD-10-CM | POA: Diagnosis present

## 2017-10-31 DIAGNOSIS — I509 Heart failure, unspecified: Secondary | ICD-10-CM

## 2017-10-31 DIAGNOSIS — I959 Hypotension, unspecified: Secondary | ICD-10-CM | POA: Diagnosis not present

## 2017-10-31 DIAGNOSIS — D508 Other iron deficiency anemias: Secondary | ICD-10-CM | POA: Diagnosis not present

## 2017-10-31 DIAGNOSIS — Z8249 Family history of ischemic heart disease and other diseases of the circulatory system: Secondary | ICD-10-CM

## 2017-10-31 DIAGNOSIS — Z79890 Hormone replacement therapy (postmenopausal): Secondary | ICD-10-CM | POA: Diagnosis not present

## 2017-10-31 DIAGNOSIS — E785 Hyperlipidemia, unspecified: Secondary | ICD-10-CM | POA: Diagnosis present

## 2017-10-31 DIAGNOSIS — E876 Hypokalemia: Secondary | ICD-10-CM | POA: Diagnosis present

## 2017-10-31 DIAGNOSIS — E872 Acidosis: Secondary | ICD-10-CM | POA: Diagnosis not present

## 2017-10-31 DIAGNOSIS — R0902 Hypoxemia: Secondary | ICD-10-CM | POA: Diagnosis not present

## 2017-10-31 DIAGNOSIS — L89899 Pressure ulcer of other site, unspecified stage: Secondary | ICD-10-CM | POA: Diagnosis not present

## 2017-10-31 DIAGNOSIS — I130 Hypertensive heart and chronic kidney disease with heart failure and stage 1 through stage 4 chronic kidney disease, or unspecified chronic kidney disease: Secondary | ICD-10-CM | POA: Diagnosis not present

## 2017-10-31 DIAGNOSIS — Z951 Presence of aortocoronary bypass graft: Secondary | ICD-10-CM

## 2017-10-31 DIAGNOSIS — G9340 Encephalopathy, unspecified: Secondary | ICD-10-CM | POA: Diagnosis not present

## 2017-10-31 DIAGNOSIS — F0390 Unspecified dementia without behavioral disturbance: Secondary | ICD-10-CM | POA: Diagnosis not present

## 2017-10-31 DIAGNOSIS — F0280 Dementia in other diseases classified elsewhere without behavioral disturbance: Secondary | ICD-10-CM | POA: Diagnosis not present

## 2017-10-31 DIAGNOSIS — R609 Edema, unspecified: Secondary | ICD-10-CM | POA: Diagnosis not present

## 2017-10-31 DIAGNOSIS — L89629 Pressure ulcer of left heel, unspecified stage: Secondary | ICD-10-CM | POA: Diagnosis present

## 2017-10-31 DIAGNOSIS — Z7951 Long term (current) use of inhaled steroids: Secondary | ICD-10-CM

## 2017-10-31 DIAGNOSIS — K219 Gastro-esophageal reflux disease without esophagitis: Secondary | ICD-10-CM | POA: Diagnosis not present

## 2017-10-31 DIAGNOSIS — M729 Fibroblastic disorder, unspecified: Secondary | ICD-10-CM | POA: Diagnosis present

## 2017-10-31 DIAGNOSIS — M2550 Pain in unspecified joint: Secondary | ICD-10-CM | POA: Diagnosis not present

## 2017-10-31 DIAGNOSIS — E538 Deficiency of other specified B group vitamins: Secondary | ICD-10-CM | POA: Diagnosis not present

## 2017-10-31 DIAGNOSIS — I110 Hypertensive heart disease with heart failure: Principal | ICD-10-CM | POA: Diagnosis present

## 2017-10-31 DIAGNOSIS — R531 Weakness: Secondary | ICD-10-CM | POA: Diagnosis not present

## 2017-10-31 DIAGNOSIS — Z79899 Other long term (current) drug therapy: Secondary | ICD-10-CM

## 2017-10-31 DIAGNOSIS — R0602 Shortness of breath: Secondary | ICD-10-CM | POA: Diagnosis not present

## 2017-10-31 DIAGNOSIS — J309 Allergic rhinitis, unspecified: Secondary | ICD-10-CM | POA: Diagnosis not present

## 2017-10-31 DIAGNOSIS — Z452 Encounter for adjustment and management of vascular access device: Secondary | ICD-10-CM | POA: Diagnosis not present

## 2017-10-31 DIAGNOSIS — D649 Anemia, unspecified: Secondary | ICD-10-CM | POA: Diagnosis not present

## 2017-10-31 DIAGNOSIS — Z7401 Bed confinement status: Secondary | ICD-10-CM | POA: Diagnosis not present

## 2017-10-31 DIAGNOSIS — I2510 Atherosclerotic heart disease of native coronary artery without angina pectoris: Secondary | ICD-10-CM | POA: Diagnosis present

## 2017-10-31 DIAGNOSIS — M6281 Muscle weakness (generalized): Secondary | ICD-10-CM | POA: Diagnosis not present

## 2017-10-31 DIAGNOSIS — E039 Hypothyroidism, unspecified: Secondary | ICD-10-CM | POA: Diagnosis not present

## 2017-10-31 DIAGNOSIS — R2681 Unsteadiness on feet: Secondary | ICD-10-CM | POA: Diagnosis not present

## 2017-10-31 DIAGNOSIS — K5900 Constipation, unspecified: Secondary | ICD-10-CM | POA: Diagnosis not present

## 2017-10-31 DIAGNOSIS — E1140 Type 2 diabetes mellitus with diabetic neuropathy, unspecified: Secondary | ICD-10-CM | POA: Diagnosis not present

## 2017-10-31 HISTORY — DX: Heart failure, unspecified: I50.9

## 2017-10-31 LAB — COMPREHENSIVE METABOLIC PANEL
ALT: <5 U/L (ref 0–44)
AST: 21 U/L (ref 15–41)
Albumin: 2.2 g/dL — ABNORMAL LOW (ref 3.5–5.0)
Alkaline Phosphatase: 70 U/L (ref 38–126)
Anion gap: 9 (ref 5–15)
BUN: 26 mg/dL — ABNORMAL HIGH (ref 8–23)
CO2: 27 mmol/L (ref 22–32)
Calcium: 8 mg/dL — ABNORMAL LOW (ref 8.9–10.3)
Chloride: 106 mmol/L (ref 98–111)
Creatinine, Ser: 1.21 mg/dL (ref 0.61–1.24)
GFR calc Af Amer: 56 mL/min — ABNORMAL LOW (ref >60)
GFR calc non Af Amer: 48 mL/min — ABNORMAL LOW (ref >60)
Glucose, Bld: 241 mg/dL — ABNORMAL HIGH (ref 70–99)
Potassium: 3.6 mmol/L (ref 3.5–5.1)
Sodium: 142 mmol/L (ref 135–145)
Total Bilirubin: 0.4 mg/dL (ref 0.3–1.2)
Total Protein: 5.6 g/dL — ABNORMAL LOW (ref 6.5–8.1)

## 2017-10-31 LAB — BRAIN NATRIURETIC PEPTIDE: B Natriuretic Peptide: 2199.9 pg/mL — ABNORMAL HIGH (ref 0.0–100.0)

## 2017-10-31 LAB — CBC
HCT: 26.2 % — ABNORMAL LOW (ref 39.0–52.0)
Hemoglobin: 8.3 g/dL — ABNORMAL LOW (ref 13.0–17.0)
MCH: 32.8 pg (ref 26.0–34.0)
MCHC: 31.7 g/dL (ref 30.0–36.0)
MCV: 103.6 fL — ABNORMAL HIGH (ref 78.0–100.0)
Platelets: 327 10*3/uL (ref 150–400)
RBC: 2.53 MIL/uL — ABNORMAL LOW (ref 4.22–5.81)
RDW: 16.6 % — ABNORMAL HIGH (ref 11.5–15.5)
WBC: 8.4 10*3/uL (ref 4.0–10.5)

## 2017-10-31 LAB — TROPONIN I
Troponin I: 0.07 ng/mL (ref <0.03)
Troponin I: 0.08 ng/mL (ref <0.03)

## 2017-10-31 LAB — I-STAT CG4 LACTIC ACID, ED
Lactic Acid, Venous: 2.99 mmol/L (ref 0.5–1.9)
Lactic Acid, Venous: 2.38 mmol/L (ref 0.5–1.9)

## 2017-10-31 LAB — GLUCOSE, CAPILLARY: GLUCOSE-CAPILLARY: 173 mg/dL — AB (ref 70–99)

## 2017-10-31 LAB — CBG MONITORING, ED: Glucose-Capillary: 232 mg/dL — ABNORMAL HIGH (ref 70–99)

## 2017-10-31 MED ORDER — FUROSEMIDE 10 MG/ML IJ SOLN
40.0000 mg | Freq: Two times a day (BID) | INTRAMUSCULAR | Status: DC
  Administered 2017-11-01 – 2017-11-03 (×5): 40 mg via INTRAVENOUS
  Filled 2017-10-31 (×5): qty 4

## 2017-10-31 MED ORDER — FAMOTIDINE 20 MG PO TABS
20.0000 mg | ORAL_TABLET | Freq: Every day | ORAL | Status: DC
  Administered 2017-11-01 – 2017-11-03 (×3): 20 mg via ORAL
  Filled 2017-10-31 (×3): qty 1

## 2017-10-31 MED ORDER — FAMOTIDINE 20 MG PO TABS: 20.0000 mg | ORAL_TABLET | Freq: Two times a day (BID) | ORAL | Status: DC

## 2017-10-31 MED ORDER — SENNOSIDES-DOCUSATE SODIUM 8.6-50 MG PO TABS: 1.0000 | ORAL_TABLET | Freq: Every evening | ORAL | Status: DC | PRN

## 2017-10-31 MED ORDER — ADULT MULTIVITAMIN W/MINERALS CH
1.0000 | ORAL_TABLET | Freq: Every day | ORAL | Status: DC
  Administered 2017-11-01 – 2017-11-02 (×2): 1 via ORAL
  Filled 2017-10-31 (×2): qty 1

## 2017-10-31 MED ORDER — LEVOTHYROXINE SODIUM 50 MCG PO TABS
50.0000 ug | ORAL_TABLET | Freq: Every day | ORAL | Status: DC
  Administered 2017-11-01 – 2017-11-03 (×3): 50 ug via ORAL
  Filled 2017-10-31 (×3): qty 1

## 2017-10-31 MED ORDER — METOPROLOL SUCCINATE ER 25 MG PO TB24
25.0000 mg | ORAL_TABLET | Freq: Every day | ORAL | Status: DC
  Administered 2017-11-01 – 2017-11-03 (×3): 25 mg via ORAL
  Filled 2017-10-31 (×3): qty 1

## 2017-10-31 MED ORDER — ASPIRIN EC 81 MG PO TBEC
81.0000 mg | DELAYED_RELEASE_TABLET | Freq: Every day | ORAL | Status: DC
  Administered 2017-11-01 – 2017-11-03 (×3): 81 mg via ORAL
  Filled 2017-10-31 (×3): qty 1

## 2017-10-31 MED ORDER — SODIUM CHLORIDE 0.9% FLUSH
10.0000 mL | INTRAVENOUS | Status: DC | PRN
  Administered 2017-11-02: 20 mL
  Filled 2017-10-31: qty 40

## 2017-10-31 MED ORDER — ACETAMINOPHEN 325 MG PO TABS: 650.0000 mg | ORAL_TABLET | Freq: Four times a day (QID) | ORAL | Status: DC | PRN

## 2017-10-31 MED ORDER — ALBUTEROL SULFATE (2.5 MG/3ML) 0.083% IN NEBU: 2.5000 mg | INHALATION_SOLUTION | Freq: Two times a day (BID) | RESPIRATORY_TRACT | Status: DC | PRN

## 2017-10-31 MED ORDER — CEFAZOLIN SODIUM-DEXTROSE 2-4 GM/100ML-% IV SOLN
2.0000 g | Freq: Two times a day (BID) | INTRAVENOUS | Status: DC
  Administered 2017-10-31 – 2017-11-01 (×2): 2 g via INTRAVENOUS
  Filled 2017-10-31 (×2): qty 100

## 2017-10-31 MED ORDER — LOSARTAN POTASSIUM 50 MG PO TABS
50.0000 mg | ORAL_TABLET | Freq: Every day | ORAL | Status: DC
  Administered 2017-11-01 – 2017-11-03 (×3): 50 mg via ORAL
  Filled 2017-10-31 (×3): qty 1

## 2017-10-31 MED ORDER — MAGNESIUM HYDROXIDE 400 MG/5ML PO SUSP
30.0000 mL | Freq: Every day | ORAL | Status: DC | PRN
Start: 2017-10-31 — End: 2017-11-03

## 2017-10-31 MED ORDER — INSULIN ASPART 100 UNIT/ML ~~LOC~~ SOLN: 0.0000 [IU] | Freq: Every day | SUBCUTANEOUS | Status: DC

## 2017-10-31 MED ORDER — INSULIN ASPART 100 UNIT/ML ~~LOC~~ SOLN
0.0000 [IU] | Freq: Three times a day (TID) | SUBCUTANEOUS | Status: DC
  Administered 2017-11-01: 2 [IU] via SUBCUTANEOUS
  Administered 2017-11-01: 1 [IU] via SUBCUTANEOUS
  Administered 2017-11-02: 2 [IU] via SUBCUTANEOUS
  Administered 2017-11-02 – 2017-11-03 (×3): 1 [IU] via SUBCUTANEOUS

## 2017-10-31 MED ORDER — BISACODYL 10 MG RE SUPP: 10.0000 mg | Freq: Every day | RECTAL | Status: DC | PRN

## 2017-10-31 MED ORDER — FUROSEMIDE 10 MG/ML IJ SOLN
40.0000 mg | Freq: Once | INTRAMUSCULAR | Status: AC
  Administered 2017-10-31: 40 mg via INTRAVENOUS
  Filled 2017-10-31: qty 4

## 2017-10-31 MED ORDER — ASPIRIN 81 MG PO CHEW: 324.0000 mg | CHEWABLE_TABLET | Freq: Once | ORAL | Status: DC

## 2017-10-31 MED ORDER — SODIUM CHLORIDE 0.9 % IV SOLN
INTRAVENOUS | Status: DC | PRN
  Administered 2017-11-02: 250 mL via INTRAVENOUS

## 2017-10-31 MED ORDER — POTASSIUM CHLORIDE CRYS ER 20 MEQ PO TBCR
60.0000 meq | EXTENDED_RELEASE_TABLET | Freq: Once | ORAL | Status: DC
  Filled 2017-10-31: qty 3

## 2017-10-31 MED ORDER — CEFAZOLIN IV (FOR PTA / DISCHARGE USE ONLY): 2.0000 g | Freq: Two times a day (BID) | INTRAVENOUS | Status: DC

## 2017-10-31 NOTE — ED Provider Notes (Signed)
[facility] DEPT Provider Note   CSN: [id number] Arrival date & time: 10/31/17  1103     History   Chief Complaint Chief Complaint  Patient presents with  . Abnormal Lab  . Shortness of Breath    HPI [name] [name] is a 84 y.o. male. [name] [name] is a 84 y.o. male with medical history significant of coronary disease status post CABG, heart failure with reduced ejection fraction, recently diagnosed methicillin sensitive staph aureus on cefazolin until August 14th, hypertension, diabetes and several other medical problems presenting with worsening shortness of breath and edema.    I discussed with the patient, the patient states that he does not know why he was sent here.  He denies any symptoms.  He denies any chest pain, but does note some shortness of breath and paroxysmal nocturnal dyspnea.  He denies any abdominal pain.  He denies nausea and vomiting.  He does have a decreased appetite.  On discussion with his daughter, they note that [facility] him here due to worsening shortness of breath as well as swelling in his feet, ankles and hands.  She notes that he was started on oxygen while at the facility.         Past Medical History:  Diagnosis Date  . CAD (coronary artery disease) 2002   Nuclear, January, 2011, no ischemia  . Carotid artery disease ([place])    Doppler, February, 2012, 0-39% bilateral  . Diabetic neuropathy ([place])    Possible  . Ejection fraction    EF 50-55%, echo, January, 2011, hypokinesis mid--base inferolateral  . HTN (hypertension)   . Hx of CABG    1998  . Hyperlipidemia   . Respiratory failure ([place])    vent dependent...acquired pneumonia...2008  . Shortness of breath    2011    Patient Active Problem List   Diagnosis Date Noted  . MSSA bacteremia 09/27/2017  . SIRS (systemic inflammatory response syndrome) ([place]) 09/26/2017  . Diabetes mellitus type II, non insulin dependent ([place]) 09/26/2017  .  Macrocytic anemia 09/26/2017  . Back pain 09/26/2017  . Pressure injury of deep tissue of left foot 09/26/2017  . CKD (chronic kidney disease), stage III ([place]) 09/26/2017  . Vitamin B12 deficiency 04/16/2016  . Late onset Alzheimer's disease without behavioral disturbance 01/14/2016  . Hypothyroidism 07/26/2014  . HTN (hypertension)   . Hyperlipidemia   . CAD (coronary artery disease)   . Diabetic neuropathy ([place])   . Carotid artery disease [facility])     Past Surgical History:  Procedure Laterality Date  . CARPAL TUNNEL RELEASE    . CORONARY ARTERY BYPASS GRAFT    . KNEE ARTHROPLASTY     total  . LAPAROSCOPIC APPENDECTOMY N/A 01/10/2013   Procedure: APPENDECTOMY LAPAROSCOPIC;  Surgeon: Donato Heinz, MD;  Location: [facility];  Service: General;  Laterality: N/A;        Home Medications    Prior to Admission medications   Medication Sig Start Date End Date Taking? Authorizing Provider  acetaminophen (TYLENOL) 325 MG tablet Take 2 tablets (650 mg total) by mouth every 6 (six) hours as needed for mild pain (or Fever >/= 101). 10/03/17  Yes Rai, Ripudeep K, MD  albuterol (PROVENTIL) (2.5 MG/3ML) 0.083% nebulizer solution Take 2.5 mg by nebulization 2 (two) times daily as needed for wheezing or shortness of breath. 10/05/17  Yes [provider]  aspirin 81 MG EC tablet Take 81 mg by mouth daily.    Yes  [provider]  bisacodyl (DULCOLAX) 10 MG suppository Place 10 mg rectally daily as needed for moderate constipation. 10/26/17  Yes [provider]  ceFAZolin (ANCEF) IVPB Inject 2 g into the vein every 12 (twelve) hours. Indication:  MSSA bacteremia Last Day of Therapy:  November 09, 2017 Labs - Once weekly:  CBC/D and BMP, Labs - Every other week:  ESR and CRP 10/03/17  Yes Rai, Ripudeep K, MD  famotidine (PEPCID) 20 MG tablet Take 1 tablet (20 mg total) by mouth 2 (two) times daily. 09/13/17  Yes Stacks, Cletus Gash, MD  fluticasone (FLONASE) 50 MCG/ACT nasal spray Place  2 sprays into both nostrils daily. 01/16/16  Yes Claretta Fraise, MD  furosemide (LASIX) 20 MG tablet Take 1 tablet (20 mg total) by mouth daily as needed for fluid or edema. 10/03/17  Yes Rai, Ripudeep K, MD  heparin lock flush 100 UNIT/ML SOLN injection Inject 500 Units into the vein every 12 (twelve) hours. Flush PICC line with 74m after IV ABT and NS flush   Yes [provider]  levothyroxine (SYNTHROID, LEVOTHROID) 50 MCG tablet TAKE 1 TABLET (50 MCG TOTAL) BY MOUTH DAILY. 09/13/17  Yes Stacks, WCletus Gash MD  losartan (COZAAR) 50 MG tablet Take 50 mg by mouth daily. 10/19/17  Yes [provider]  magnesium hydroxide (MILK OF MAGNESIA) 400 MG/5ML suspension Take 30 mLs by mouth daily as needed for mild constipation. 10/26/17  Yes [provider]  metoprolol succinate (TOPROL-XL) 25 MG 24 hr tablet Take 1 tablet (25 mg total) by mouth daily. 10/03/17  Yes Rai, Ripudeep K, MD  Multiple Vitamin (MULTIVITAMIN WITH MINERALS) TABS tablet Take 1 tablet by mouth daily. 10/12/17  Yes [provider]  senna-docusate (SENOKOT-S) 8.6-50 MG tablet Take 1 tablet by mouth at bedtime as needed for mild constipation. 10/03/17  Yes Rai, Ripudeep K, MD  donepezil (ARICEPT) 10 MG tablet TAKE 1 TABLET BY MOUTH EVERYDAY AT BEDTIME Patient not taking: Reported on 10/31/2017 09/13/17   SClaretta Fraise MD  glimepiride (AMARYL) 2 MG tablet TAKE 1 TABLET (2 MG TOTAL) BY MOUTH DAILY BEFORE BREAKFAST. Patient not taking: Reported on 10/31/2017 09/13/17   SClaretta Fraise MD  hydrALAZINE (APRESOLINE) 25 MG tablet Take 1 tablet (25 mg total) by mouth every 8 (eight) hours. Patient not taking: Reported on 10/31/2017 10/03/17   Rai, Ripudeep K, MD  ipratropium-albuterol (DUONEB) 0.5-2.5 (3) MG/3ML SOLN Take 3 mLs by nebulization every 6 (six) hours as needed. Patient not taking: Reported on 10/31/2017 10/03/17   Rai, RVernelle Emerald MD  mometasone-formoterol (DULERA) 100-5 MCG/ACT AERO Inhale 2 puffs into the lungs 2 (two)  times daily. Patient not taking: Reported on 10/31/2017 10/03/17   Rai, RVernelle Emerald MD  predniSONE (DELTASONE) 10 MG tablet Prednisone dosing: Take  Prednisone 319m(3 tabs) x 2 days, then 2063m2 tabs) x 2 days, then 69m46m tab) x 2 days, then OFF. Patient not taking: Reported on 10/31/2017 10/03/17   Rai,Mendel Corning    Family History Family History  Problem Relation Age of Onset  . Heart disease Father        mother  . Heart attack Father 92  28   deceased  . Heart failure Mother 99  64   deceased    Social History Social History   Tobacco Use  . Smoking status: Never Smoker  . Smokeless tobacco: Never Used  Substance Use Topics  . Alcohol use: No  . Drug use:  No     Allergies   Patient has no known allergies.   Review of Systems Review of Systems  Level 5 caveat because of dementia. Physical Exam Updated Vital Signs BP 123/69 (BP Location: Left Arm)   Pulse 80   Temp 98.1 F (36.7 C) (Oral)   Resp (!) 26   SpO2 97%   Physical Exam  Constitutional: He appears well-developed and well-nourished. No distress.  HENT:  Head: Normocephalic and atraumatic.  Eyes: Conjunctivae are normal. Right eye exhibits no discharge. Left eye exhibits no discharge.  Neck: Neck supple.  Cardiovascular: Normal rate, regular rhythm and normal heart sounds. Exam reveals no friction rub.  No murmur heard. PICC line right upper extremity with no concerning external skin changes.  Pulmonary/Chest: Effort normal.  Mild tachypnea.  Crackles bilaterally.  Abdominal: Soft. He exhibits no distension. There is no tenderness.  Musculoskeletal: He exhibits edema. He exhibits no tenderness.  Symmetric pitting lower extremity edema  Neurological: He is alert.  Skin: Skin is warm and dry.  Psychiatric: He has a normal mood and affect. His behavior is normal. Thought content normal.  Nursing note and vitals reviewed.    ED Treatments / Results  Labs (all labs ordered are listed, but only  abnormal results are displayed) Labs Reviewed  COMPREHENSIVE METABOLIC PANEL - Abnormal; Notable for the following components:      Result Value   Glucose, Bld 241 (*)    BUN 26 (*)    Calcium 8.0 (*)    Total Protein 5.6 (*)    Albumin 2.2 (*)    GFR calc non Af Amer 48 (*)    GFR calc Af Amer 56 (*)    All other components within normal limits  CBC - Abnormal; Notable for the following components:   RBC 2.53 (*)    Hemoglobin 8.3 (*)    HCT 26.2 (*)    MCV 103.6 (*)    RDW 16.6 (*)    All other components within normal limits  BRAIN NATRIURETIC PEPTIDE - Abnormal; Notable for the following components:   B Natriuretic Peptide 2,199.9 (*)    All other components within normal limits  TROPONIN I - Abnormal; Notable for the following components:   Troponin I 0.07 (*)    All other components within normal limits  CBG MONITORING, ED - Abnormal; Notable for the following components:   Glucose-Capillary 232 (*)    All other components within normal limits  I-STAT CG4 LACTIC ACID, ED - Abnormal; Notable for the following components:   Lactic Acid, Venous 2.99 (*)    All other components within normal limits  CULTURE, BLOOD (ROUTINE X 2)  CULTURE, BLOOD (ROUTINE X 2)  I-STAT CG4 LACTIC ACID, ED    EKG EKG Interpretation  Date/Time:  Monday October 31 2017 11:19:28 EDT Ventricular Rate:  88 PR Interval:    QRS Duration: 142 QT Interval:  394 QTC Calculation: 477 R Axis:   20 Text Interpretation:  Sinus rhythm Multiform ventricular premature complexes Anteroseptal infarct, old Repol abnrm suggests ischemia, diffuse leads Poor data quality Confirmed by Virgel Manifold [phone]) on 10/31/2017 11:48:30 AM   Radiology Dg Chest 2 View  Result Date: 10/31/2017 CLINICAL DATA:  Increase shortness of breath. History of respiratory failure, diabetes, and coronary artery disease. EXAM: CHEST - 2 VIEW COMPARISON:  Chest x-ray of October 02, 2017 FINDINGS: The lungs are borderline hypoinflated.  There small bilateral pleural effusions. The retrocardiac region on the left is dense. The  cardiac silhouette is enlarged. The pulmonary vascularity is prominent centrally. There are post CABG changes. There is calcification in the wall of the aortic arch. IMPRESSION: Findings worrisome CHF. One cannot exclude basilar pneumonia in the appropriate clinical setting. There are small bilateral pleural effusions greatest on the right. Thoracic aortic atherosclerosis. Electronically Signed   By: David  Martinique M.D.   On: 10/31/2017 12:04    Procedures Procedures (including critical care time)  Medications Ordered in ED Medications - No data to display   Initial Impression / Assessment and Plan / ED Course  I have reviewed the triage vital signs and the nursing notes.  Pertinent labs & imaging results that were available during my care of the patient were reviewed by me and considered in my medical decision making (see chart for details).     89yM with change in mental status. He is pleasant and has no complaints but he also clearly has dementia and isn't a reliable historian. Currently being treated for MSSA bacteremia likely from chronic foot wounds. I think more acute issue though is heart failure. BNP is down from recent hospitalization but currently appears volume overloaded. Peripheral edema. Sounds wet. CXR with edema. Hemoglobin is down somewhat but not to the point where I would transfuse. This may be dilutional to some degree. MOst recent ECHO with EF of 20-25%. Nutrition/hypoalbunemia likely conti  Final Clinical Impressions(s) / ED Diagnoses   Final diagnoses:  Acute on chronic congestive heart failure, unspecified heart failure type [facility])    ED Discharge Orders    None       Virgel Manifold, MD 11/03/17 1630

## 2017-10-31 NOTE — ED Notes (Signed)
ED TO INPATIENT HANDOFF REPORT  Name/Age/Gender [name] [name] 84 y.o. male  Code Status Code Status History    Date Active Date Inactive Code Status Order ID Comments User Context   09/26/2017 2048 10/03/2017 1810 DNR [id number]  Vianne Bulls, MD ED   01/10/2013 2230 01/14/2013 1741 Full Code [id number]  Donato Heinz, MD Inpatient    Questions for Most Recent Historical Code Status (Order [id number])    Question Answer Comment   In the event of cardiac or respiratory ARREST Do not call a "code blue"    In the event of cardiac or respiratory ARREST Do not perform Intubation, CPR, defibrillation or ACLS    In the event of cardiac or respiratory ARREST Use medication by any route, position, wound care, and other measures to relive pain and suffering. May use oxygen, suction and manual treatment of airway obstruction as needed for comfort.         Advance Directive Documentation     Most Recent Value  Type of Advance Directive  Healthcare Power of Attorney, Living will  Pre-existing out of facility DNR order (yellow form or pink MOST form)  -  "MOST" Form in Place?  -      Home/SNF/Other Nursing Home  Chief Complaint Ab Labs  Level of Care/Admitting Diagnosis ED Disposition    ED Disposition Condition [place]: [place] [[id number]]  Level of Care: Telemetry [5]  Admit to tele based on following criteria: Other see comments  Comments: HF exacerbation  Diagnosis: Heart failure [facility]) [[id number]]  Admitting Physician: Elodia Florence [phone]  Attending Physician: Cephus Slater, A CALDWELL [phone]  Estimated length of stay: past midnight tomorrow  Certification:: I certify this patient will need inpatient services for at least 2 midnights  PT Class (Do Not Modify): Inpatient [101]  PT Acc Code (Do Not Modify): Private [1]       Medical History Past Medical History:  Diagnosis Date  . CAD (coronary artery disease) 2002   Nuclear,  January, 2011, no ischemia  . Carotid artery disease ([place])    Doppler, February, 2012, 0-39% bilateral  . Diabetic neuropathy ([place])    Possible  . Ejection fraction    EF 50-55%, echo, January, 2011, hypokinesis mid--base inferolateral  . HTN (hypertension)   . Hx of CABG    1998  . Hyperlipidemia   . Respiratory failure ([place])    vent dependent...acquired pneumonia...2008  . Shortness of breath    2011    Allergies No Known Allergies  IV Location/Drains/Wounds Patient Lines/Drains/Airways Status   Active Line/Drains/Airways    Name:   Placement date:   Placement time:   Site:   Days:   Peripheral IV 10/31/17 Left Antecubital   10/31/17    1300    Antecubital   less than 1   PICC Single Lumen 86/38/17 PICC Right Basilic 38 cm 0 cm   71/16/57    9038    Basilic   29          Labs/Imaging Results for orders placed or performed during the hospital encounter of 10/31/17 (from the past 48 hour(s))  Comprehensive metabolic panel     Status: Abnormal   Collection Time: 10/31/17 11:32 AM  Result Value Ref Range   Sodium 142 135 - 145 mmol/L   Potassium 3.6 3.5 - 5.1 mmol/L   Chloride 106 98 - 111 mmol/L   CO2 27 22 - 32 mmol/L  Glucose, Bld 241 (H) 70 - 99 mg/dL   BUN 26 (H) 8 - 23 mg/dL   Creatinine, Ser 1.21 0.61 - 1.24 mg/dL   Calcium 8.0 (L) 8.9 - 10.3 mg/dL   Total Protein 5.6 (L) 6.5 - 8.1 g/dL   Albumin 2.2 (L) 3.5 - 5.0 g/dL   AST 21 15 - 41 U/L   ALT <5 0 - 44 U/L   Alkaline Phosphatase 70 38 - 126 U/L   Total Bilirubin 0.4 0.3 - 1.2 mg/dL   GFR calc non Af Amer 48 (L) >60 mL/min   GFR calc Af Amer 56 (L) >60 mL/min    Comment: (NOTE) The eGFR has been calculated using the CKD EPI equation. This calculation has not been validated in all clinical situations. eGFR's persistently <60 mL/min signify possible Chronic Kidney Disease.    Anion gap 9 5 - 15    Comment: Performed at [facility], [place] [address], [place], [place] [address]  CBC      Status: Abnormal   Collection Time: 10/31/17 11:32 AM  Result Value Ref Range   WBC 8.4 4.0 - 10.5 K/uL   RBC 2.53 (L) 4.22 - 5.81 MIL/uL   Hemoglobin 8.3 (L) 13.0 - 17.0 g/dL   HCT 26.2 (L) 39.0 - 52.0 %   MCV 103.6 (H) 78.0 - 100.0 fL   MCH 32.8 26.0 - 34.0 pg   MCHC 31.7 30.0 - 36.0 g/dL   RDW 16.6 (H) 11.5 - 15.5 %   Platelets 327 150 - 400 K/uL    Comment: Performed at [facility], [place] [address] [place], [place] [address]  Brain natriuretic peptide     Status: Abnormal   Collection Time: 10/31/17 11:32 AM  Result Value Ref Range   B Natriuretic Peptide 2,199.9 (H) 0.0 - 100.0 pg/mL    Comment: Performed at [facility], [place] [address], [place], [place] [address]  Troponin I     Status: Abnormal   Collection Time: 10/31/17 11:32 AM  Result Value Ref Range   Troponin I 0.07 (HH) <0.03 ng/mL    Comment: CRITICAL RESULT CALLED TO, READ BACK BY AND VERIFIED WITH: Vernon Prey RN @1232  ON 8.5.19 BY [facility] Performed at [facility], [place] [address] [place], [place] [address]   CBG monitoring, ED     Status: Abnormal   Collection Time: 10/31/17 11:46 AM  Result Value Ref Range   Glucose-Capillary 232 (H) 70 - 99 mg/dL  I-Stat CG4 Lactic Acid, ED     Status: Abnormal   Collection Time: 10/31/17 12:35 PM  Result Value Ref Range   Lactic Acid, Venous 2.99 (HH) 0.5 - 1.9 mmol/L   Comment NOTIFIED PHYSICIAN   I-Stat CG4 Lactic Acid, ED     Status: Abnormal   Collection Time: 10/31/17  3:13 PM  Result Value Ref Range   Lactic Acid, Venous 2.38 (HH) 0.5 - 1.9 mmol/L   Comment NOTIFIED PHYSICIAN    Dg Chest 2 View  Result Date: 10/31/2017 CLINICAL DATA:  Increase shortness of breath. History of respiratory failure, diabetes, and coronary artery disease. EXAM: CHEST - 2 VIEW COMPARISON:  Chest x-ray of October 02, 2017 FINDINGS: The lungs are borderline hypoinflated. There small bilateral pleural effusions. The retrocardiac region  on the left is dense. The cardiac silhouette is enlarged. The pulmonary vascularity is prominent centrally. There are post CABG changes. There is calcification in the wall of the aortic arch. IMPRESSION: Findings  worrisome CHF. One cannot exclude basilar pneumonia in the appropriate clinical setting. There are small bilateral pleural effusions greatest on the right. Thoracic aortic atherosclerosis. Electronically Signed   By: David  Martinique M.D.   On: 10/31/2017 12:04    Pending Labs Unresulted Labs (From admission, onward)   Start     Ordered   10/31/17 1203  Blood culture (routine x 2)  BLOOD CULTURE X 2,   STAT     10/31/17 1202   Signed and Held  Comprehensive metabolic panel  Tomorrow morning,   R     Signed and Held   Signed and Held  CBC  Tomorrow morning,   R     Signed and Held   Signed and Held  Magnesium  Tomorrow morning,   R     Signed and Held      Vitals/Pain Today's Vitals   10/31/17 1515 10/31/17 1530 10/31/17 1545 10/31/17 1600  BP:  (!) 142/76  127/67  Pulse: 86 86 72   Resp: (!) 26 (!) 24 (!) 26 (!) 23  Temp:      TempSrc:      SpO2: 97% 97% 98% 98%    Isolation Precautions No active isolations  Medications Medications  potassium chloride SA (K-DUR,KLOR-CON) CR tablet 60 mEq (60 mEq Oral Refused 10/31/17 1237)  aspirin chewable tablet 324 mg (324 mg Oral Not Given 10/31/17 1314)  furosemide (LASIX) injection 40 mg (has no administration in time range)  furosemide (LASIX) injection 40 mg (40 mg Intravenous Given 10/31/17 1239)    Mobility non-ambulatory

## 2017-10-31 NOTE — ED Notes (Signed)
Patient not following commands in order to swallow pills. [place],MD made aware.

## 2017-10-31 NOTE — H&P (Addendum)
History and Physical    Jamesen Stahnke Shieh YPP:[id number] DOB: 06/12/1918 DOA: 10/31/2017  PCP: Claretta Fraise, MD  Patient coming from: [facility]  I have personally briefly reviewed patient's old medical records in [place]  Chief Complaint: edema  HPI: [name] [name] is Latif Nazareno 84 y.o. male with medical history significant of coronary disease status post CABG, heart failure with reduced ejection fraction, recently diagnosed methicillin sensitive staph aureus on cefazolin until August 14th, hypertension, diabetes and several other medical problems presenting with worsening shortness of breath and edema.    I discussed with the patient, the patient states that he does not know why he was sent here.  He denies any symptoms.  He denies any chest pain, but does note some shortness of breath and paroxysmal nocturnal dyspnea.  He denies any abdominal pain.  He denies nausea and vomiting.  He does have Alianny Toelle decreased appetite.  On discussion with his daughter, they note that [facility] here due to worsening shortness of breath as well as swelling in his feet, ankles and hands.  She notes that he was started on oxygen while at the facility.   ED Course: Labs, CXR, EKG, lasix.  Admit for HF exacerbation.  Review of Systems: As per HPI otherwise 10 point review of systems negative.   Past Medical History:  Diagnosis Date  . CAD (coronary artery disease) 2002   Nuclear, January, 2011, no ischemia  . Carotid artery disease ([place])    Doppler, February, 2012, 0-39% bilateral  . Diabetic neuropathy ([place])    Possible  . Ejection fraction    EF 50-55%, echo, January, 2011, hypokinesis mid--base inferolateral  . HTN (hypertension)   . Hx of CABG    1998  . Hyperlipidemia   . Respiratory failure ([place])    vent dependent...acquired pneumonia...2008  . Shortness of breath    2011    Past Surgical History:  Procedure Laterality Date  . CARPAL TUNNEL RELEASE    . CORONARY ARTERY  BYPASS GRAFT    . KNEE ARTHROPLASTY     total  . LAPAROSCOPIC APPENDECTOMY N/Cadey Bazile 01/10/2013   Procedure: APPENDECTOMY LAPAROSCOPIC;  Surgeon: Donato Heinz, MD;  Location: [facility];  Service: General;  Laterality: N/Jalicia Roszak;     reports that he has never smoked. He has never used smokeless tobacco. He reports that he does not drink alcohol or use drugs.  No Known Allergies  Family History  Problem Relation Age of Onset  . Heart disease Father        mother  . Heart attack Father 103       deceased  . Heart failure Mother 69       deceased   Prior to Admission medications   Medication Sig Start Date End Date Taking? Authorizing Provider  acetaminophen (TYLENOL) 325 MG tablet Take 2 tablets (650 mg total) by mouth every 6 (six) hours as needed for mild pain (or Fever >/= 101). 10/03/17  Yes Rai, Ripudeep K, MD  albuterol (PROVENTIL) (2.5 MG/3ML) 0.083% nebulizer solution Take 2.5 mg by nebulization 2 (two) times daily as needed for wheezing or shortness of breath. 10/05/17  Yes [provider]  aspirin 81 MG EC tablet Take 81 mg by mouth daily.    Yes [provider]  bisacodyl (DULCOLAX) 10 MG suppository Place 10 mg rectally daily as needed for moderate constipation. 10/26/17  Yes [provider]  ceFAZolin (ANCEF) IVPB Inject 2 g into the vein every  12 (twelve) hours. Indication:  MSSA bacteremia Last Day of Therapy:  November 09, 2017 Labs - Once weekly:  CBC/D and BMP, Labs - Every other week:  ESR and CRP 10/03/17  Yes Rai, Ripudeep K, MD  famotidine (PEPCID) 20 MG tablet Take 1 tablet (20 mg total) by mouth 2 (two) times daily. 09/13/17  Yes Stacks, Cletus Gash, MD  fluticasone (FLONASE) 50 MCG/ACT nasal spray Place 2 sprays into both nostrils daily. 01/16/16  Yes Claretta Fraise, MD  furosemide (LASIX) 20 MG tablet Take 1 tablet (20 mg total) by mouth daily as needed for fluid or edema. 10/03/17  Yes Rai, Ripudeep K, MD  heparin lock flush 100 UNIT/ML SOLN injection Inject 500  Units into the vein every 12 (twelve) hours. Flush PICC line with 64m after IV ABT and NS flush   Yes [provider]  levothyroxine (SYNTHROID, LEVOTHROID) 50 MCG tablet TAKE 1 TABLET (50 MCG TOTAL) BY MOUTH DAILY. 09/13/17  Yes Stacks, WCletus Gash MD  losartan (COZAAR) 50 MG tablet Take 50 mg by mouth daily. 10/19/17  Yes [provider]  magnesium hydroxide (MILK OF MAGNESIA) 400 MG/5ML suspension Take 30 mLs by mouth daily as needed for mild constipation. 10/26/17  Yes [provider]  metoprolol succinate (TOPROL-XL) 25 MG 24 hr tablet Take 1 tablet (25 mg total) by mouth daily. 10/03/17  Yes Rai, Ripudeep K, MD  Multiple Vitamin (MULTIVITAMIN WITH MINERALS) TABS tablet Take 1 tablet by mouth daily. 10/12/17  Yes [provider]  senna-docusate (SENOKOT-S) 8.6-50 MG tablet Take 1 tablet by mouth at bedtime as needed for mild constipation. 10/03/17  Yes Rai, Ripudeep K, MD  donepezil (ARICEPT) 10 MG tablet TAKE 1 TABLET BY MOUTH EVERYDAY AT BEDTIME Patient not taking: Reported on 10/31/2017 09/13/17   SClaretta Fraise MD  glimepiride (AMARYL) 2 MG tablet TAKE 1 TABLET (2 MG TOTAL) BY MOUTH DAILY BEFORE BREAKFAST. Patient not taking: Reported on 10/31/2017 09/13/17   SClaretta Fraise MD  hydrALAZINE (APRESOLINE) 25 MG tablet Take 1 tablet (25 mg total) by mouth every 8 (eight) hours. Patient not taking: Reported on 10/31/2017 10/03/17   Rai, Ripudeep K, MD  ipratropium-albuterol (DUONEB) 0.5-2.5 (3) MG/3ML SOLN Take 3 mLs by nebulization every 6 (six) hours as needed. Patient not taking: Reported on 10/31/2017 10/03/17   Rai, RVernelle Emerald MD  mometasone-formoterol (DULERA) 100-5 MCG/ACT AERO Inhale 2 puffs into the lungs 2 (two) times daily. Patient not taking: Reported on 10/31/2017 10/03/17   Rai, RVernelle Emerald MD  predniSONE (DELTASONE) 10 MG tablet Prednisone dosing: Take  Prednisone 345m(3 tabs) x 2 days, then 207m2 tabs) x 2 days, then 56m86m tab) x 2 days, then OFF. Patient not  taking: Reported on 10/31/2017 10/03/17   Rai,Mendel Corning    Physical Exam: Vitals:   10/31/17 1530 10/31/17 1545 10/31/17 1600 10/31/17 1717  BP: (!) 142/76  127/67 140/78  Pulse: 86 72 76 87  Resp: (!) 24 (!) 26 (!) 23 20  Temp:    98.9 F (37.2 C)  TempSrc:    Oral  SpO2: 97% 98% 98% 97%  Weight:    81.7 kg (180 lb 1.9 oz)  Height:    5' 8" (1.727 m)    Constitutional: NAD, calm, comfortable, sleepy Vitals:   10/31/17 1530 10/31/17 1545 10/31/17 1600 10/31/17 1717  BP: (!) 142/76  127/67 140/78  Pulse: 86 72 76 87  Resp: (!) 24 (!) 26 (!) 23 20  Temp:  98.9 F (37.2 C)  TempSrc:    Oral  SpO2: 97% 98% 98% 97%  Weight:    81.7 kg (180 lb 1.9 oz)  Height:    5' 8" (1.727 m)   Eyes: PERRL, lids and conjunctivae normal ENMT: Mucous membranes are moist. Posterior pharynx clear of any exudate or lesions.Normal dentition.  Neck: normal, supple, no masses, no thyromegaly Respiratory: Slightly increased WOB, crackles at bases Cardiovascular: Regular rate and rhythm, no murmurs / rubs / gallops. 2+ LEE, greater on the L. Palpable pedal pulses. Abdomen: no tenderness, no masses palpated. No hepatosplenomegaly. Bowel sounds positive.  Musculoskeletal: no clubbing / cyanosis. No joint deformity upper and lower extremities. Good ROM, no contractures. Normal muscle tone.  Skin: LLE ulcers (pt daughter notes she did not know of one to heel) Neurologic: CN 2-12 grossly intact. Sensation intact, DTR normal. Moving all extremities. Psychiatric: Normal judgment and insight. Alert and oriented x 3. Normal mood.   Labs on Admission: I have personally reviewed following labs and imaging studies  CBC: Recent Labs  Lab 10/31/17 1132  WBC 8.4  HGB 8.3*  HCT 26.2*  MCV 103.6*  PLT 664   Basic Metabolic Panel: Recent Labs  Lab 10/31/17 1132  NA 142  K 3.6  CL 106  CO2 27  GLUCOSE 241*  BUN 26*  CREATININE 1.21  CALCIUM 8.0*   GFR: Estimated Creatinine Clearance: 33.8  mL/min (by C-G formula based on SCr of 1.21 mg/dL). Liver Function Tests: Recent Labs  Lab 10/31/17 1132  AST 21  ALT <5  ALKPHOS 70  BILITOT 0.4  PROT 5.6*  ALBUMIN 2.2*   No results for input(s): LIPASE, AMYLASE in the last 168 hours. No results for input(s): AMMONIA in the last 168 hours. Coagulation Profile: No results for input(s): INR, PROTIME in the last 168 hours. Cardiac Enzymes: Recent Labs  Lab 10/31/17 1132  TROPONINI 0.07*   BNP (last 3 results) No results for input(s): PROBNP in the last 8760 hours. HbA1C: No results for input(s): HGBA1C in the last 72 hours. CBG: Recent Labs  Lab 10/31/17 1146  GLUCAP 232*   Lipid Profile: No results for input(s): CHOL, HDL, LDLCALC, TRIG, CHOLHDL, LDLDIRECT in the last 72 hours. Thyroid Function Tests: No results for input(s): TSH, T4TOTAL, FREET4, T3FREE, THYROIDAB in the last 72 hours. Anemia Panel: No results for input(s): VITAMINB12, FOLATE, FERRITIN, TIBC, IRON, RETICCTPCT in the last 72 hours. Urine analysis:    Component Value Date/Time   COLORURINE AMBER (Mihcael Ledee) 09/26/2017 1732   APPEARANCEUR CLEAR 09/26/2017 1732   APPEARANCEUR Clear 07/21/2016 1617   LABSPEC 1.020 09/26/2017 1732   PHURINE 5.0 09/26/2017 1732   GLUCOSEU NEGATIVE 09/26/2017 1732   HGBUR NEGATIVE 09/26/2017 1732   BILIRUBINUR SMALL (Jamarious Febo) 09/26/2017 1732   BILIRUBINUR Positive (Rayetta Veith) 07/21/2016 1617   KETONESUR 5 (Angie Piercey) 09/26/2017 1732   PROTEINUR 30 (Asaph Serena) 09/26/2017 1732   UROBILINOGEN negative 03/12/2015 1619   NITRITE NEGATIVE 09/26/2017 1732   LEUKOCYTESUR NEGATIVE 09/26/2017 1732   LEUKOCYTESUR Negative 07/21/2016 1617    Radiological Exams on Admission: Dg Chest 2 View  Result Date: 10/31/2017 CLINICAL DATA:  Increase shortness of breath. History of respiratory failure, diabetes, and coronary artery disease. EXAM: CHEST - 2 VIEW COMPARISON:  Chest x-ray of October 02, 2017 FINDINGS: The lungs are borderline hypoinflated. There small bilateral  pleural effusions. The retrocardiac region on the left is dense. The cardiac silhouette is enlarged. The pulmonary vascularity is prominent centrally. There are post CABG changes.  There is calcification in the wall of the aortic arch. IMPRESSION: Findings worrisome CHF. One cannot exclude basilar pneumonia in the appropriate clinical setting. There are small bilateral pleural effusions greatest on the right. Thoracic aortic atherosclerosis. Electronically Signed   By: David  Martinique M.D.   On: 10/31/2017 12:04    EKG: Independently reviewed. Sinus rhythm, nonspecific IVCD.  T wave inversions in V5, V6.  As well as II, III, aVF and V3, V4. (similar to priors, but now inferior lead T wave inversions and more notable in V3, V6).    Assessment/Plan Active Problems:   Heart failure (HCC)  HFrEF  HFpEF Exacerbation:  EF from 09/27/17 was 25-30% with grade 1 diastolic dysfunction.  Presents here with worsening swelling and CXR findings c/w HF. IV Lasix 40 BID (was on 20 mg prn at facility) Continue metoprolol, losartan Follow troponins Follow I/O, daily weights Follow LE [place] with L>R LEE  Acute Hypoxic Resp Failure: likely 2/2 above, CXR with possible baslilar pneumonia, but no notable infectious symptoms.  Follow.  MSSA Bacteremia: source thought possibly 2/2 L foot at [facility] with MRI with cellulitis and fasciitis, but no septic arthritis or osteo.  Had 2D echo without endocarditis.  Family declined MRI spine or TEE - continue cefazolin until August 14  Left Lower Extremity Pressure Ulcer: pt now has ulcer to his heel, which his daughter notes is new.  Follow wound care recs.  Anemia: downtrended since last hospitalization.  No obvious source of bleeding.  Follow repeat in AM.  Transfuse for <7.    Elevated troponin: Without CP.  likely demand in setting of above, follow  Elevated Lactate: downtrending, repeat in AM  Abnormal EKG: follow repeat EKG in AM  CAD with hx of CABG:  continue ASA, no CP  T2DM: SSI  Goals of Care: family notes primary goal is to keep Mr. Canela comfortable.  They want to treat the treatable from our discussion.  Will c/s palliative care for additional [place] conversations.  DVT prophylaxis: SCD's  Code Status: DNR  Family Communication: daughter and son in law  Disposition Plan: pending improvement  Consults called: none  Admission status: inpatient   Fayrene Helper MD [facility] Pager [phone]  If 7PM-7AM, please contact night-coverage [email or web address] Password [facility]  10/31/2017, 6:17 PM

## 2017-10-31 NOTE — ED Notes (Signed)
Bed: [facility]02 Expected date:  Expected time:  Means of arrival:  Comments: 84 yo; abnormal labs

## 2017-10-31 NOTE — ED Notes (Addendum)
Report given to [facility], Therapist, sports.

## 2017-10-31 NOTE — ED Notes (Signed)
RN spoke to staff at [facility] who report pt is declining quickly. Pt has been altered the last few days and is normally alert and oriented. Pt's family was debating whether or not to have him sent to the hospital or consider more comfort care. Terri (pt's daughter) is the decision maker at this time.

## 2017-10-31 NOTE — Progress Notes (Signed)
Patient admitted from ED for CHF and ulcers to left foot. Noted bruise to right lower abdomen. 3 ulcers noted to left foot, great toe, medial heel and beside fifth toe. All black in color. Wound care consult noted. Patient stated they do not hurt unless pressed on deeply.

## 2017-10-31 NOTE — ED Notes (Signed)
Writer notified EDP of abnormal I-stat lactic result 

## 2017-10-31 NOTE — ED Notes (Signed)
2 sets of cultures obtained.

## 2017-10-31 NOTE — ED Triage Notes (Signed)
Per EMS: Pt is coming from [facility] with c/o abnormal lab. Facility reported pt had low Hgb and RBC's. Pt has an ulcer on the bottom of his foot and is having increased SOB.  Pt is retaining fluid.

## 2017-11-01 ENCOUNTER — Inpatient Hospital Stay: Payer: Medicare Other | Admitting: Family

## 2017-11-01 ENCOUNTER — Inpatient Hospital Stay (HOSPITAL_COMMUNITY): Payer: Medicare Other

## 2017-11-01 DIAGNOSIS — R609 Edema, unspecified: Secondary | ICD-10-CM

## 2017-11-01 DIAGNOSIS — I5043 Acute on chronic combined systolic (congestive) and diastolic (congestive) heart failure: Secondary | ICD-10-CM

## 2017-11-01 LAB — CBC
HCT: 24.8 % — ABNORMAL LOW (ref 39.0–52.0)
HEMOGLOBIN: 7.8 g/dL — AB (ref 13.0–17.0)
MCH: 32.6 pg (ref 26.0–34.0)
MCHC: 31.5 g/dL (ref 30.0–36.0)
MCV: 103.8 fL — AB (ref 78.0–100.0)
Platelets: 298 10*3/uL (ref 150–400)
RBC: 2.39 MIL/uL — ABNORMAL LOW (ref 4.22–5.81)
RDW: 16.6 % — ABNORMAL HIGH (ref 11.5–15.5)
WBC: 7 10*3/uL (ref 4.0–10.5)

## 2017-11-01 LAB — COMPREHENSIVE METABOLIC PANEL
ALK PHOS: 65 U/L (ref 38–126)
ALT: 5 U/L (ref 0–44)
ANION GAP: 9 (ref 5–15)
AST: 16 U/L (ref 15–41)
Albumin: 2.1 g/dL — ABNORMAL LOW (ref 3.5–5.0)
BUN: 24 mg/dL — ABNORMAL HIGH (ref 8–23)
CALCIUM: 7.9 mg/dL — AB (ref 8.9–10.3)
CO2: 28 mmol/L (ref 22–32)
CREATININE: 1.05 mg/dL (ref 0.61–1.24)
Chloride: 105 mmol/L (ref 98–111)
GFR, EST NON AFRICAN AMERICAN: 57 mL/min — AB (ref >60)
Glucose, Bld: 163 mg/dL — ABNORMAL HIGH (ref 70–99)
Potassium: 3.6 mmol/L (ref 3.5–5.1)
Sodium: 142 mmol/L (ref 135–145)
Total Bilirubin: 0.5 mg/dL (ref 0.3–1.2)
Total Protein: 5.2 g/dL — ABNORMAL LOW (ref 6.5–8.1)

## 2017-11-01 LAB — MAGNESIUM: MAGNESIUM: 1.9 mg/dL (ref 1.7–2.4)

## 2017-11-01 LAB — LACTIC ACID, PLASMA: Lactic Acid, Venous: 1.1 mmol/L (ref 0.5–1.9)

## 2017-11-01 LAB — TROPONIN I: Troponin I: 0.08 ng/mL (ref <0.03)

## 2017-11-01 LAB — GLUCOSE, CAPILLARY
GLUCOSE-CAPILLARY: 184 mg/dL — AB (ref 70–99)
GLUCOSE-CAPILLARY: 138 mg/dL — AB (ref 70–99)
Glucose-Capillary: 124 mg/dL — ABNORMAL HIGH (ref 70–99)
Glucose-Capillary: 150 mg/dL — ABNORMAL HIGH (ref 70–99)

## 2017-11-01 LAB — TYPE AND SCREEN
ABO/RH(D): AB NEG
Antibody Screen: NEGATIVE

## 2017-11-01 LAB — ABO/RH: ABO/RH(D): AB NEG

## 2017-11-01 LAB — MRSA PCR SCREENING: MRSA by PCR: POSITIVE — AB

## 2017-11-01 MED ORDER — CHLORHEXIDINE GLUCONATE CLOTH 2 % EX PADS
6.0000 | MEDICATED_PAD | Freq: Every day | CUTANEOUS | Status: DC
  Administered 2017-11-02 – 2017-11-03 (×2): 6 via TOPICAL

## 2017-11-01 MED ORDER — CEFAZOLIN SODIUM-DEXTROSE 2-4 GM/100ML-% IV SOLN
2.0000 g | Freq: Three times a day (TID) | INTRAVENOUS | Status: DC
  Administered 2017-11-01 – 2017-11-03 (×6): 2 g via INTRAVENOUS
  Filled 2017-11-01 (×8): qty 100

## 2017-11-01 MED ORDER — MUPIROCIN 2 % EX OINT
1.0000 "application " | TOPICAL_OINTMENT | Freq: Two times a day (BID) | CUTANEOUS | Status: DC
  Administered 2017-11-01 – 2017-11-03 (×4): 1 via NASAL
  Filled 2017-11-01: qty 22

## 2017-11-01 NOTE — Consult Note (Signed)
Consultation Note Date: 11/01/2017   Patient Name: [name] [name]  DOB: 08/31/1918  MRN: [id number]  Age / Sex: 84 y.o., male  PCP: Claretta Fraise, MD Referring Physician: Lavina Hamman, MD  Reason for Consultation: Establishing goals of care  HPI/Patient Profile: 84 y.o. male admitted on 10/31/2017    Clinical Assessment and Goals of Care:  [name] [name] is a 84 y.o. male with medical history significant of coronary disease status post CABG, heart failure with reduced ejection fraction, recently diagnosed methicillin sensitive staph aureus on cefazolin until August 14th, hypertension, diabetes and several other medical problems presenting with worsening shortness of breath and edema.     Patient has been sent by [facility], presumably for shortness of breath. The patient is also noted to have declined functionally and cognitively recently, he hasn't been eating well.   A PMT consult has been placed for additional [place] discussions.   The patient is resting in bed, his son, from out of town has arrived at the bedside, I introduced myself and palliative care as follows: Palliative medicine is specialized medical care for people living with serious illness. It focuses on providing relief from the symptoms and stress of a serious illness. The goal is to improve quality of life for both the patient and the family.  The patient is awake, but confused. He is able to answer a few yes/no type questions well, but doesn't know why he is in the hospital.   Chart reviewed, discussed with son at bedside.   See below.    HCPOA    SUMMARY OF RECOMMENDATIONS    1. Agree with DNR 2. Recommend SNF rehab with palliative care on discharge.  Thank you for the consult.   Code Status/Advance Care Planning:  DNR    Symptom Management:    as above   Palliative Prophylaxis:   Delirium Protocol     Psycho-social/Spiritual:   Desire for further Chaplaincy support:yes  Additional Recommendations: Caregiving  Support/Resources  Prognosis:   Unable to determine  Discharge Planning: [place] for rehab with Palliative care service follow-up      Primary Diagnoses: Present on Admission: **None**   I have reviewed the medical record, interviewed the patient and family, and examined the patient. The following aspects are pertinent.  Past Medical History:  Diagnosis Date  . CAD (coronary artery disease) 2002   Nuclear, January, 2011, no ischemia  . Carotid artery disease ([place])    Doppler, February, 2012, 0-39% bilateral  . Diabetic neuropathy ([place])    Possible  . Ejection fraction    EF 50-55%, echo, January, 2011, hypokinesis mid--base inferolateral  . HTN (hypertension)   . Hx of CABG    1998  . Hyperlipidemia   . Respiratory failure ([place])    vent dependent...acquired pneumonia...2008  . Shortness of breath    2011   Social History   Socioeconomic History  . Marital status: Married    Spouse name: Not on file  . Number of children: Not on  file  . Years of education: Not on file  . Highest education level: Not on file  Occupational History  . Occupation: Academic librarian: RETIRED  Social Needs  . Financial resource strain: Not on file  . Food insecurity:    Worry: Not on file    Inability: Not on file  . Transportation needs:    Medical: Not on file    Non-medical: Not on file  Tobacco Use  . Smoking status: Never Smoker  . Smokeless tobacco: Never Used  Substance and Sexual Activity  . Alcohol use: No  . Drug use: No  . Sexual activity: Not on file  Lifestyle  . Physical activity:    Days per week: Not on file    Minutes per session: Not on file  . Stress: Not on file  Relationships  . Social connections:    Talks on phone: Not on file    Gets together: Not on file    Attends religious service: Not on file    Active  member of club or organization: Not on file    Attends meetings of clubs or organizations: Not on file    Relationship status: Not on file  Other Topics Concern  . Not on file  Social History Narrative  . Not on file   Family History  Problem Relation Age of Onset  . Heart disease Father        mother  . Heart attack Father 32       deceased  . Heart failure Mother 29       deceased   Scheduled Meds: . aspirin  324 mg Oral Once  . aspirin EC  81 mg Oral Daily  . famotidine  20 mg Oral Daily  . furosemide  40 mg Intravenous BID  . insulin aspart  0-5 Units Subcutaneous QHS  . insulin aspart  0-9 Units Subcutaneous TID WC  . levothyroxine  50 mcg Oral QAC breakfast  . losartan  50 mg Oral Daily  . metoprolol succinate  25 mg Oral Daily  . multivitamin with minerals  1 tablet Oral Daily  . potassium chloride  60 mEq Oral Once   Continuous Infusions: . sodium chloride    .  ceFAZolin (ANCEF) IV 2 g (11/01/17 1331)   PRN Meds:.sodium chloride, acetaminophen, albuterol, bisacodyl, magnesium hydroxide, senna-docusate, sodium chloride flush Medications Prior to Admission:  Prior to Admission medications   Medication Sig Start Date End Date Taking? Authorizing Provider  acetaminophen (TYLENOL) 325 MG tablet Take 2 tablets (650 mg total) by mouth every 6 (six) hours as needed for mild pain (or Fever >/= 101). 10/03/17  Yes Rai, Ripudeep K, MD  albuterol (PROVENTIL) (2.5 MG/3ML) 0.083% nebulizer solution Take 2.5 mg by nebulization 2 (two) times daily as needed for wheezing or shortness of breath. 10/05/17  Yes [provider]  aspirin 81 MG EC tablet Take 81 mg by mouth daily.    Yes [provider]  bisacodyl (DULCOLAX) 10 MG suppository Place 10 mg rectally daily as needed for moderate constipation. 10/26/17  Yes [provider]  ceFAZolin (ANCEF) IVPB Inject 2 g into the vein every 12 (twelve) hours. Indication:  MSSA bacteremia Last Day of Therapy:  November 09, 2017 Labs - Once weekly:  CBC/D and BMP, Labs - Every other week:  ESR and CRP 10/03/17  Yes Rai, Ripudeep K, MD  famotidine (PEPCID) 20 MG tablet Take 1 tablet (20 mg total) by mouth 2 (  two) times daily. 09/13/17  Yes Stacks, Cletus Gash, MD  fluticasone (FLONASE) 50 MCG/ACT nasal spray Place 2 sprays into both nostrils daily. 01/16/16  Yes Claretta Fraise, MD  furosemide (LASIX) 20 MG tablet Take 1 tablet (20 mg total) by mouth daily as needed for fluid or edema. 10/03/17  Yes Rai, Ripudeep K, MD  heparin lock flush 100 UNIT/ML SOLN injection Inject 500 Units into the vein every 12 (twelve) hours. Flush PICC line with 23m after IV ABT and NS flush   Yes [provider]  levothyroxine (SYNTHROID, LEVOTHROID) 50 MCG tablet TAKE 1 TABLET (50 MCG TOTAL) BY MOUTH DAILY. 09/13/17  Yes Stacks, WCletus Gash MD  losartan (COZAAR) 50 MG tablet Take 50 mg by mouth daily. 10/19/17  Yes [provider]  magnesium hydroxide (MILK OF MAGNESIA) 400 MG/5ML suspension Take 30 mLs by mouth daily as needed for mild constipation. 10/26/17  Yes [provider]  metoprolol succinate (TOPROL-XL) 25 MG 24 hr tablet Take 1 tablet (25 mg total) by mouth daily. 10/03/17  Yes Rai, Ripudeep K, MD  Multiple Vitamin (MULTIVITAMIN WITH MINERALS) TABS tablet Take 1 tablet by mouth daily. 10/12/17  Yes [provider]  senna-docusate (SENOKOT-S) 8.6-50 MG tablet Take 1 tablet by mouth at bedtime as needed for mild constipation. 10/03/17  Yes Rai, Ripudeep K, MD  donepezil (ARICEPT) 10 MG tablet TAKE 1 TABLET BY MOUTH EVERYDAY AT BEDTIME Patient not taking: Reported on 10/31/2017 09/13/17   SClaretta Fraise MD  glimepiride (AMARYL) 2 MG tablet TAKE 1 TABLET (2 MG TOTAL) BY MOUTH DAILY BEFORE BREAKFAST. Patient not taking: Reported on 10/31/2017 09/13/17   SClaretta Fraise MD  hydrALAZINE (APRESOLINE) 25 MG tablet Take 1 tablet (25 mg total) by mouth every 8 (eight) hours. Patient not taking: Reported on 10/31/2017 10/03/17    Rai, Ripudeep K, MD  ipratropium-albuterol (DUONEB) 0.5-2.5 (3) MG/3ML SOLN Take 3 mLs by nebulization every 6 (six) hours as needed. Patient not taking: Reported on 10/31/2017 10/03/17   Rai, RVernelle Emerald MD  mometasone-formoterol (DULERA) 100-5 MCG/ACT AERO Inhale 2 puffs into the lungs 2 (two) times daily. Patient not taking: Reported on 10/31/2017 10/03/17   Rai, RVernelle Emerald MD  predniSONE (DELTASONE) 10 MG tablet Prednisone dosing: Take  Prednisone 329m(3 tabs) x 2 days, then 2034m2 tabs) x 2 days, then 64m58m tab) x 2 days, then OFF. Patient not taking: Reported on 10/31/2017 10/03/17   Rai,Mendel Corning   No Known Allergies Review of Systems Denies pain  Physical Exam Weak elderly gentleman In no distress Resting in bed Confused S1 S2 Clear Abdomen soft Has edema Has LLE ulcers.   Vital Signs: BP 131/80 (BP Location: Left Arm)   Pulse 67   Temp 98.4 F (36.9 C) (Oral)   Resp 20   Ht 5' 8"  (1.727 m)   Wt 83.4 kg (183 lb 13.8 oz)   SpO2 100%   BMI 27.96 kg/m  Pain Scale: 0-10 POSS *See Group Information*: 1-Acceptable,Awake and alert     SpO2: SpO2: 100 % O2 Device:SpO2: 100 % O2 Flow Rate: .O2 Flow Rate (L/min): 3 L/min  IO: Intake/output summary:   Intake/Output Summary (Last 24 hours) at 11/01/2017 1620 Last data filed at 11/01/2017 1332 Gross per 24 hour  Intake 100 ml  Output 500 ml  Net -400 ml    LBM: Last BM Date: (patients doesn't know) Baseline Weight: Weight: 81.7 kg (180 lb 1.9 oz) Most recent weight: Weight: 83.4 kg (183 lb  13.8 oz)     Palliative Assessment/Data:  PPS 40%   Time In:  9 Time Out:10   Time Total:  60 min  Greater than 50%  of this time was spent counseling and coordinating care related to the above assessment and plan.  Signed by: Loistine Chance, MD  [phone]  Please contact Palliative Medicine Team phone at [phone] for questions and concerns.  For individual provider: See Shea Evans

## 2017-11-01 NOTE — Consult Note (Signed)
[facility] Nurse wound consult note Reason for Consult:wound care to areas of eschar on left foot.  Suspect arterial insufficiency vs gangrene vs pressure (least likely) Wound type: See above Pressure Injury POA: NA Measurement:1st digit:  3cm x 3.2cm with depth unable to be assessed due to eschar Medial heel (medial) 4cm x 4cm with depth unable to be assessed due to eschar Wound VVZ:SMOLMB adherent, dry eschar Drainage (amount, consistency, odor) none Periwound:intact with resolving edema (according to family) Dressing procedure/placement/frequency: I will treat conservatively to [prevent infection and provide protection from injury and pressure using betadine swabstick application, and Prevalon Boots. More aggressive care plan may be at the discretion of VVS or orthopedics.If you agree/deswire, please contact/consult those specialists.  [facility] nursing team will not follow, but will remain available to this patient, the nursing and medical teams.  Please re-consult if needed. Thanks, Maudie Flakes, MSN, RN, [place], Arther Abbott  Pager# [phone]

## 2017-11-01 NOTE — Progress Notes (Signed)
Bilateral lower extremity venous duplex has been completed. Negative for DVT.  11/01/17 10:08 AM [name] [name] RVT

## 2017-11-01 NOTE — Progress Notes (Signed)
[facility] Progress Note  Patient: [name] [name] XLK:[id number]   PCP: Claretta Fraise, MD DOB: 22-Dec-1918   DOA: 10/31/2017   DOS: 11/01/2017   Date of Service: the patient was seen and examined on 11/01/2017  Subjective: Feeling better, no acute complaints.  No acute events overnight.  No nausea no vomiting.  Breathing better.  Brief hospital course: Pt. with PMH of CAD S/P CABG, chronic systolic CHF, recent MSSA on IV cefazolin, HTN, type II DM, chronic nonhealing wound; admitted on 10/31/2017, presented with complaint of shortness of breath, was found to have acute on chronic systolic CHF. Currently further plan is continue IV Lasix.  Assessment and Plan: 1.  Acute on chronic combined systolic and diastolic CHF. Started on IV Lasix. Oxygenation now getting better. Continue IV Lasix for today and would likely transition to oral tomorrow. Continue metoprolol and losartan. Not adequately documented Is and Os,  monitor. Daily weight.  2.  Recent MSSA bacteremia.  Wound care consult for pressure ulcers.  Appreciate their assistance.  Likely from cellulitis and fasciitis. Patient is on IV antibiotics to be completed on November 09, 2017. Based on his renal function dose will be increased now. Monitor.  3.  CAD S/P CABG. Mild elevated troponin. Trend consistent with CHF rather than acute ischemia. EKG also unremarkable. Patient denies any acute chest pain or heaviness. Monitor on telemetry.  4.  Type 2 diabetes mellitus. Continue sliding scale insulin.  5.  Good discussion. Palliative care consult, appreciate their input. Currently DNR/DNI with recommendation for going to SNF with rehab with palliative care on discharge.  Diet: Cardiac diet DVT Prophylaxis: subcutaneous Heparin  Advance goals of care discussion: DNR DNI  Family Communication: no family was present at bedside, at the time of interview.   Disposition:  Discharge to SNF.  Consultants: Palliative care    Procedures: none  Antibiotics: Anti-infectives (From admission, onward)   Start     Dose/Rate Route Frequency Ordered Stop   11/01/17 1400  ceFAZolin (ANCEF) IVPB 2g/100 mL premix     2 g 200 mL/hr over 30 Minutes Intravenous Every 8 hours 11/01/17 0931 11/09/17 2359   10/31/17 2200  ceFAZolin (ANCEF) IVPB  Status:  Discontinued    Note to Pharmacy:  Indication:  MSSA bacteremia Last Day of Therapy:  November 09, 2017 Labs - Once weekly:  CBC/D and BMP, Labs - Every other week:  ESR and CRP     2 g Intravenous Every 12 hours 10/31/17 1726 10/31/17 1753   10/31/17 2000  ceFAZolin (ANCEF) IVPB 2g/100 mL premix  Status:  Discontinued     2 g 200 mL/hr over 30 Minutes Intravenous Every 12 hours 10/31/17 1753 11/01/17 0931       Objective: Physical Exam: Vitals:   11/01/17 0500 11/01/17 0508 11/01/17 1300 11/01/17 1301  BP:  131/72 131/80   Pulse:  73 67 67  Resp:  18 20   Temp:  98.9 F (37.2 C) 98.4 F (36.9 C)   TempSrc:   Oral   SpO2:  99% 100% 100%  Weight: 83.4 kg (183 lb 13.8 oz)     Height:        Intake/Output Summary (Last 24 hours) at 11/01/2017 1743 Last data filed at 11/01/2017 1622 Gross per 24 hour  Intake 200 ml  Output 500 ml  Net -300 ml   Filed Weights   10/31/17 1717 11/01/17 0500  Weight: 81.7 kg (180 lb 1.9 oz) 83.4 kg (183 lb 13.8 oz)  General: Alert, Awake and Oriented to Time, Place and Person. Appear in mild distress, affect appropriate Eyes: PERRL, Conjunctiva normal ENT: Oral Mucosa clear moist. Neck: no JVD, no Abnormal Mass Or lumps Cardiovascular: S1 and S2 Present, aortic systolic  Murmur, Peripheral Pulses Present Respiratory: normal respiratory effort, Bilateral Air entry equal and Decreased, no use of accessory muscle, basal Crackles, no wheezes Abdomen: Bowel Sound present, Soft and no tenderness, no hernia Skin: no redness, no Rash, no induration Extremities: tracve Pedal edema, no calf tenderness Neurologic: Grossly no focal neuro  deficit. Bilaterally Equal motor strength  Data Reviewed: CBC: Recent Labs  Lab 10/31/17 1132 11/01/17 0032  WBC 8.4 7.0  HGB 8.3* 7.8*  HCT 26.2* 24.8*  MCV 103.6* 103.8*  PLT 327 570   Basic Metabolic Panel: Recent Labs  Lab 10/31/17 1132 11/01/17 0032  NA 142 142  K 3.6 3.6  CL 106 105  CO2 27 28  GLUCOSE 241* 163*  BUN 26* 24*  CREATININE 1.21 1.05  CALCIUM 8.0* 7.9*  MG  --  1.9    Liver Function Tests: Recent Labs  Lab 10/31/17 1132 11/01/17 0032  AST 21 16  ALT <5 5  ALKPHOS 70 65  BILITOT 0.4 0.5  PROT 5.6* 5.2*  ALBUMIN 2.2* 2.1*   No results for input(s): LIPASE, AMYLASE in the last 168 hours. No results for input(s): AMMONIA in the last 168 hours. Coagulation Profile: No results for input(s): INR, PROTIME in the last 168 hours. Cardiac Enzymes: Recent Labs  Lab 10/31/17 1132 10/31/17 1926 11/01/17 0032  TROPONINI 0.07* 0.08* 0.08*   BNP (last 3 results) No results for input(s): PROBNP in the last 8760 hours. CBG: Recent Labs  Lab 10/31/17 1146 10/31/17 2101 11/01/17 0741 11/01/17 1123 11/01/17 1654  GLUCAP 232* 173* 124* 184* 138*   Studies: No results found.  Scheduled Meds: . aspirin  324 mg Oral Once  . aspirin EC  81 mg Oral Daily  . famotidine  20 mg Oral Daily  . furosemide  40 mg Intravenous BID  . insulin aspart  0-5 Units Subcutaneous QHS  . insulin aspart  0-9 Units Subcutaneous TID WC  . levothyroxine  50 mcg Oral QAC breakfast  . losartan  50 mg Oral Daily  . metoprolol succinate  25 mg Oral Daily  . multivitamin with minerals  1 tablet Oral Daily  . potassium chloride  60 mEq Oral Once   Continuous Infusions: . sodium chloride    .  ceFAZolin (ANCEF) IV 2 g (11/01/17 1331)   PRN Meds: sodium chloride, acetaminophen, albuterol, bisacodyl, magnesium hydroxide, senna-docusate, sodium chloride flush  Time spent: 35 minutes  Author: Berle Mull, MD [facility] Pager: [phone] 11/01/2017 5:43  PM  If 7PM-7AM, please contact night-coverage at [email or web address], password [facility]

## 2017-11-02 DIAGNOSIS — D649 Anemia, unspecified: Secondary | ICD-10-CM

## 2017-11-02 LAB — CBC WITH DIFFERENTIAL/PLATELET
Basophils Absolute: 0 10*3/uL (ref 0.0–0.1)
Basophils Relative: 1 %
EOS PCT: 1 %
Eosinophils Absolute: 0.1 10*3/uL (ref 0.0–0.7)
HCT: 24.9 % — ABNORMAL LOW (ref 39.0–52.0)
Hemoglobin: 7.8 g/dL — ABNORMAL LOW (ref 13.0–17.0)
Lymphocytes Relative: 19 %
Lymphs Abs: 1.5 10*3/uL (ref 0.7–4.0)
MCH: 31.8 pg (ref 26.0–34.0)
MCHC: 31.3 g/dL (ref 30.0–36.0)
MCV: 101.6 fL — ABNORMAL HIGH (ref 78.0–100.0)
Monocytes Absolute: 0.7 10*3/uL (ref 0.1–1.0)
Monocytes Relative: 9 %
NEUTROS PCT: 70 %
Neutro Abs: 5.5 10*3/uL (ref 1.7–7.7)
Platelets: 321 10*3/uL (ref 150–400)
RBC: 2.45 MIL/uL — AB (ref 4.22–5.81)
RDW: 16.7 % — ABNORMAL HIGH (ref 11.5–15.5)
WBC: 7.8 10*3/uL (ref 4.0–10.5)

## 2017-11-02 LAB — COMPREHENSIVE METABOLIC PANEL
ALBUMIN: 2.1 g/dL — AB (ref 3.5–5.0)
AST: 17 U/L (ref 15–41)
Alkaline Phosphatase: 62 U/L (ref 38–126)
Anion gap: 8 (ref 5–15)
BUN: 23 mg/dL (ref 8–23)
CO2: 30 mmol/L (ref 22–32)
Calcium: 8 mg/dL — ABNORMAL LOW (ref 8.9–10.3)
Chloride: 103 mmol/L (ref 98–111)
Creatinine, Ser: 1.1 mg/dL (ref 0.61–1.24)
GFR calc Af Amer: >60 mL/min (ref >60)
GFR, EST NON AFRICAN AMERICAN: 54 mL/min — AB (ref >60)
Glucose, Bld: 133 mg/dL — ABNORMAL HIGH (ref 70–99)
Potassium: 3.2 mmol/L — ABNORMAL LOW (ref 3.5–5.1)
SODIUM: 141 mmol/L (ref 135–145)
Total Bilirubin: 0.6 mg/dL (ref 0.3–1.2)
Total Protein: 5.2 g/dL — ABNORMAL LOW (ref 6.5–8.1)

## 2017-11-02 LAB — GLUCOSE, CAPILLARY
GLUCOSE-CAPILLARY: 124 mg/dL — AB (ref 70–99)
Glucose-Capillary: 122 mg/dL — ABNORMAL HIGH (ref 70–99)
Glucose-Capillary: 168 mg/dL — ABNORMAL HIGH (ref 70–99)
Glucose-Capillary: 147 mg/dL — ABNORMAL HIGH (ref 70–99)

## 2017-11-02 LAB — MAGNESIUM: Magnesium: 1.8 mg/dL (ref 1.7–2.4)

## 2017-11-02 MED ORDER — POTASSIUM CHLORIDE CRYS ER 20 MEQ PO TBCR
40.0000 meq | EXTENDED_RELEASE_TABLET | ORAL | Status: AC
  Administered 2017-11-02 (×2): 40 meq via ORAL
  Filled 2017-11-02 (×2): qty 2

## 2017-11-02 NOTE — Clinical Social Work Note (Signed)
Clinical Social Work Assessment  Patient Details  Name: [name] [name] MRN: [id number] Date of Birth: 11-05-1918  Date of referral:  11/02/17               Reason for consult:                   Permission sought to share information with:    Permission granted to share information::     Name::        Agency::     Relationship::     Contact Information:     Housing/Transportation Living arrangements for the past 2 months:  Single Family Home, Skilled Nursing Facility(Patient has been at [facility] for the past 5 weeks for ST rehab) Source of Information:  Adult Children(Daughter - Lina Sayre [phone])) Patient Interpreter Needed:  None Criminal Activity/Legal Involvement Pertinent to Current Situation/Hospitalization:  No - Comment as needed Significant Relationships:  Adult Children, Spouse Lives with:  Spouse, Facility Resident Do you feel safe going back to the place where you live?  (Patient's family wants patient to return to SNF to complete rehab) Need for family participation in patient care:  Yes (Comment)  Care giving concerns:  Patient admitted from [facility], where patient was receiving ST rehab (IV antibioitics, PT, OT and ST). Patient's daughter reported that prior to going to SNF for ST rehab patient lived at home with wife who also has dementia. Patient's daughter reported that patient used a walker at baseline and needed assistance with bathing and dressing, noting patient had a sitter for 8 hours daily. Patient's daughter reported that patient hasn't made a lot of progress at SNF.PT consulted, evaluation pending.   Social Worker assessment / plan:  CSW spoke with patient's daughter regarding patient's discharge planning, patient only oriented to self and unable to participate in assessment. Patient's daughter reported that the plan is for patient to return to [facility] to complete ST rehab. CSW agreed to complete patient's FL2 and  contact [facility] SNF to confirm patient's ability to return.  CSW contacted [facility] and spoke with staff member Judson Roch, who confirmed patient's ability to return.  CSW will complete patient's FL2 and continue to follow and assist with discharge planning.  Employment status:  Retired Forensic scientist:  Medicare PT Recommendations:  Not assessed at this time [place] / Referral to community resources:  Other (Comment Required)(Patient admitted from a SNF where he was receiving ST rehab)  Patient/Family's Response to care:  Patient's daughter appreciative of CSW assistance with discharge planning.  Patient/Family's Understanding of and Emotional Response to Diagnosis, Current Treatment, and Prognosis:  Patient's daughter involved in patient's care and verbalized understanding of current treatment plan. Patient's daughter verbalized plan for patient to dc back to SNF for ST rehab. Patient's daughter informed CSW about patient's lack of progress thus far and uncertainty about patient's health moving forward. CSW provided active listening.   Emotional Assessment Appearance:    Attitude/Demeanor/Rapport:  Unable to Assess Affect (typically observed):  Unable to Assess Orientation:  Oriented to Self Alcohol / Substance use:  Not Applicable Psych involvement (Current and /or in the community):  No (Comment)  Discharge Needs  Concerns to be addressed:  Care Coordination Readmission within the last 30 days:  Yes Current discharge risk:  None Barriers to Discharge:  Continued Medical Work up   [employer], [place] 11/02/2017, 11:30 AM

## 2017-11-02 NOTE — Progress Notes (Addendum)
PROGRESS NOTE    [name] Craine [name]  ERX:[id number] DOB: 1918-08-08 DOA: 10/31/2017 PCP: Claretta Fraise, MD  Outpatient Specialists:     Brief Narrative:  84 yo male with a PMH significant for CAD s/p CABG, chronic systolic CHF, HTN, P5KD, chornic unhealing wound with recent MSSA on IV cefazolin, is here from [facility] where facility workers believed he was having worsening shortness of breath and uncontrolled swelling in his lower extremities. The patient arrived at [facility] ED demonstrating some confusion with his whereabouts. However, he did endorse some shortness of breath and paroxysmal nocturnal dyspnea. ED course included CXR which showed possible CHF fluid overload vs basilar pneumonia and small bilateral pleural effusions. EKG showed sinus rhythm with PVCs. Pertinent labs include: BNP 2199.9, Troponin 2x 0.07 0.08, blood cultures - no growth, lactate 2x 2.99 2.38 (down trending), potassium 3.2. The patient was started on IV lasix and admitted for further heart failure workup.   Assessment & Plan:   Active Problems:   Heart failure (HCC)  Acute on chronic systolic heart failure with exacerbation: -ECHO 09/27/17 shows Severe LV dysfunction; mild diastolic dysfunction; mild LVH; mild AI; mildly dilated aortic root; mild MR; mild LAE; mild RVE; mild TR with mild pulmonary hypertension. -BNP 2199.9 -VAS [place] bilateral lower extremity ordered due to edema greater in L vs R leg - no evidence of DVT -The patient endorses an improvement in his breathing today -Lungs are clear to auscultation  -+1 pedal edema observed, bilaterally. -Ins: 1020 Outs: 500. However he is 8 lbs lighter since yesterday. -Continue IV Lasix -Continue Metoprolol and Losartan -Palliative care consulted, recommends SNF rehab and palliative care on discharge  Left foot ulcers: -2 ulcerative lesions observed on the heal and lateral to 5th metatarsal -Poor healing noted, but no signs of localized infection -Continue Ancef    -Wound care recommends treating conservatively with measures to protect against injury and pressure, using betadine swabstick application and Prevalon boots -Stable kidney function, increasing dose is reasonable.  Macrocytic Anemia: -CBC shows RBC 2.45 Hgb 7.8 Hct 24.9 MCV 101.6  -Likely from malnutrition. Albumin levels are low as well (2.1) -Anemia panel ordered  CAD: -Troponin x2 0.07. 0.08, consistent with ischemia 2/2 to CHF -EKG significant for PVCs, consistent with chronic CHF -IVCD in place -Continue to monitor on telemetry -Continue Aspirin 81 mg daily  Hypertension: -Well-controlled at this time -Patient denies HA and blurred vision -Continue metoprolol 25 mg and losartan 50 mg  T2DM: -Sugars under control -Continue with Novolog  Hypokalemia: -Will replete 40 meq -Repeat BMP -Magnesium wnl  Hypothyroid: -Continue Synthroid   DVT prophylaxis: SQ Heparin Code Status: DNR Family Communication: The patient's two sons were at the bedside during interview. The plan was discussed and all questions were answered. Disposition Plan: The patient is being monitored on telemetry. Ins/Outs. Daily weights. Continue IV lasix for another night.   Consultants:   Palliative Care  Procedures:   None  Antimicrobials:   Cefazolin   Subjective: The patient was lying on his right side, sleeping when interview began. He is alert but oriented only to person. He endorses his breathing is better.  Objective: Vitals:   11/01/17 2125 11/02/17 0500 11/02/17 0546 11/02/17 0548  BP: 136/75  (!) 87/60 131/80  Pulse: 78  85 86  Resp: 15  15   Temp: 98.7 F (37.1 C)  99.2 F (37.3 C)   TempSrc: Oral  Oral   SpO2: 90%  91%   Weight:  79.7 kg (175 lb  11.3 oz)    Height:        Intake/Output Summary (Last 24 hours) at 11/02/2017 1039 Last data filed at 11/02/2017 0956 Gross per 24 hour  Intake 780 ml  Output 500 ml  Net 280 ml   Filed Weights   10/31/17 1717 11/01/17  0500 11/02/17 0500  Weight: 81.7 kg (180 lb 1.9 oz) 83.4 kg (183 lb 13.8 oz) 79.7 kg (175 lb 11.3 oz)    Examination:  General exam: Appears calm and comfortable  Respiratory system: Clear to auscultation. Respiratory effort normal. Cardiovascular system: S1 & S2 heard, RRR. No JVD, murmurs, rubs, gallops or clicks. Bilateral +1 pedal edema, radiating to his shin. Gastrointestinal system: Abdomen is nondistended, soft and nontender. No organomegaly or masses felt. Normal bowel sounds heard. Central nervous system: Alert and oriented. No focal neurological deficits. Extremities: Symmetric 5 x 5 power. Skin: No rashes. Ulcerative lesion to the left heal, no signs of mucopurulent discharge. Poor healing. Second lesion lateral to 5th metatarsal, pea-size, also showing signs of poor healing. Lesions are both TTP. Psychiatry: Judgement and insight appear normal. Mood & affect appropriate.     Data Reviewed: I have personally reviewed following labs and imaging studies  CBC: Recent Labs  Lab 10/31/17 1132 11/01/17 0032 11/02/17 0336  WBC 8.4 7.0 7.8  NEUTROABS  --   --  5.5  HGB 8.3* 7.8* 7.8*  HCT 26.2* 24.8* 24.9*  MCV 103.6* 103.8* 101.6*  PLT 327 298 413   Basic Metabolic Panel: Recent Labs  Lab 10/31/17 1132 11/01/17 0032 11/02/17 0336  NA 142 142 141  K 3.6 3.6 3.2*  CL 106 105 103  CO2 27 28 30   GLUCOSE 241* 163* 133*  BUN 26* 24* 23  CREATININE 1.21 1.05 1.10  CALCIUM 8.0* 7.9* 8.0*  MG  --  1.9 1.8   GFR: Estimated Creatinine Clearance: 37.1 mL/min (by C-G formula based on SCr of 1.1 mg/dL). Liver Function Tests: Recent Labs  Lab 10/31/17 1132 11/01/17 0032 11/02/17 0336  AST 21 16 17   ALT <5 5 <5  ALKPHOS 70 65 62  BILITOT 0.4 0.5 0.6  PROT 5.6* 5.2* 5.2*  ALBUMIN 2.2* 2.1* 2.1*   No results for input(s): LIPASE, AMYLASE in the last 168 hours. No results for input(s): AMMONIA in the last 168 hours. Coagulation Profile: No results for input(s): INR,  PROTIME in the last 168 hours. Cardiac Enzymes: Recent Labs  Lab 10/31/17 1132 10/31/17 1926 11/01/17 0032  TROPONINI 0.07* 0.08* 0.08*   BNP (last 3 results) No results for input(s): PROBNP in the last 8760 hours. HbA1C: No results for input(s): HGBA1C in the last 72 hours. CBG: Recent Labs  Lab 11/01/17 0741 11/01/17 1123 11/01/17 1654 11/01/17 2128 11/02/17 0727  GLUCAP 124* 184* 138* 150* 122*   Lipid Profile: No results for input(s): CHOL, HDL, LDLCALC, TRIG, CHOLHDL, LDLDIRECT in the last 72 hours. Thyroid Function Tests: No results for input(s): TSH, T4TOTAL, FREET4, T3FREE, THYROIDAB in the last 72 hours. Anemia Panel: No results for input(s): VITAMINB12, FOLATE, FERRITIN, TIBC, IRON, RETICCTPCT in the last 72 hours. Urine analysis:    Component Value Date/Time   COLORURINE AMBER (A) 09/26/2017 1732   APPEARANCEUR CLEAR 09/26/2017 1732   APPEARANCEUR Clear 07/21/2016 1617   LABSPEC 1.020 09/26/2017 1732   PHURINE 5.0 09/26/2017 1732   GLUCOSEU NEGATIVE 09/26/2017 1732   HGBUR NEGATIVE 09/26/2017 1732   BILIRUBINUR SMALL (A) 09/26/2017 1732   BILIRUBINUR Positive (A) 07/21/2016 1617  KETONESUR 5 (A) 09/26/2017 1732   PROTEINUR 30 (A) 09/26/2017 1732   UROBILINOGEN negative 03/12/2015 1619   NITRITE NEGATIVE 09/26/2017 1732   LEUKOCYTESUR NEGATIVE 09/26/2017 1732   LEUKOCYTESUR Negative 07/21/2016 1617   Sepsis Labs: @LABRCNTIP (procalcitonin:4,lacticidven:4)  ) Recent Results (from the past 240 hour(s))  Blood culture (routine x 2)     Status: None (Preliminary result)   Collection Time: 10/31/17 12:25 PM  Result Value Ref Range Status   Specimen Description   Final    BLOOD LEFT ANTECUBITAL Performed at [facility], [place] [address] [place], [place] [address]    Special Requests   Final    BOTTLES DRAWN AEROBIC AND ANAEROBIC Blood Culture results may not be optimal due to an excessive volume of blood received in culture  bottles Performed at [place] [address] [place], [place] [address]    Culture   Final    NO GROWTH < 24 HOURS Performed at [place] [address], [place], [place] [address]    Report Status PENDING  Incomplete  Blood culture (routine x 2)     Status: None (Preliminary result)   Collection Time: 10/31/17 12:26 PM  Result Value Ref Range Status   Specimen Description   Final    BLOOD RIGHT ANTECUBITAL Performed at [place] [address], [place], [place] [address]    Special Requests   Final    BOTTLES DRAWN AEROBIC AND ANAEROBIC Blood Culture results may not be optimal due to an excessive volume of blood received in culture bottles Performed at [place] [address], [place], [place] [address]    Culture   Final    NO GROWTH < 24 HOURS Performed at [place] [address] [place],  [address]    Report Status PENDING  Incomplete  MRSA PCR Screening     Status: Abnormal   Collection Time: 11/01/17  7:49 AM  Result Value Ref Range Status   MRSA by PCR POSITIVE (A) NEGATIVE Final    Comment:        The GeneXpert MRSA Assay (FDA approved for NASAL specimens only), is one component of a comprehensive MRSA colonization surveillance program. It is not intended to diagnose MRSA infection nor to guide or monitor treatment for MRSA infections. RESULT CALLED TO, READ BACK BY AND VERIFIED WITH: Mare Ferrari [id number] @ [place] Performed at [facility], [place] [address] [place],  [address]          Radiology Studies: Dg Chest 2 View  Result Date: 10/31/2017 CLINICAL DATA:  Increase shortness of breath. History of respiratory failure, diabetes, and coronary artery disease. EXAM: CHEST - 2 VIEW COMPARISON:  Chest x-ray of October 02, 2017 FINDINGS: The lungs are borderline hypoinflated. There small bilateral pleural effusions. The  retrocardiac region on the left is dense. The cardiac silhouette is enlarged. The pulmonary vascularity is prominent centrally. There are post CABG changes. There is calcification in the wall of the aortic arch. IMPRESSION: Findings worrisome CHF. One cannot exclude basilar pneumonia in the appropriate clinical setting. There are small bilateral pleural effusions greatest on the right. Thoracic aortic atherosclerosis. Electronically Signed   By: David  Martinique M.D.   On: 10/31/2017 12:04        Scheduled Meds: . aspirin  324 mg Oral Once  . aspirin EC  81 mg Oral Daily  . Chlorhexidine Gluconate Cloth  6 each Topical Q0600  . famotidine  20 mg Oral Daily  . furosemide  40 mg Intravenous BID  . insulin aspart  0-5 Units Subcutaneous QHS  . insulin aspart  0-9 Units Subcutaneous TID WC  . levothyroxine  50 mcg Oral QAC breakfast  . losartan  50 mg Oral Daily  . metoprolol succinate  25 mg Oral Daily  . multivitamin with minerals  1 tablet Oral Daily  . mupirocin ointment  1 application Nasal BID  . potassium chloride  60 mEq Oral Once   Continuous Infusions: . sodium chloride    .  ceFAZolin (ANCEF) IV Stopped (11/02/17 5916)     LOS: 2 days    Marney Setting, PA-S Chipper Oman MD [facility] Pager [phone]  If 7PM-7AM, please contact night-coverage [email or web address] Password [facility] 11/02/2017, 10:39 AM

## 2017-11-03 DIAGNOSIS — R531 Weakness: Secondary | ICD-10-CM | POA: Diagnosis not present

## 2017-11-03 DIAGNOSIS — R7881 Bacteremia: Secondary | ICD-10-CM | POA: Diagnosis not present

## 2017-11-03 DIAGNOSIS — G301 Alzheimer's disease with late onset: Secondary | ICD-10-CM | POA: Diagnosis not present

## 2017-11-03 DIAGNOSIS — E872 Acidosis: Secondary | ICD-10-CM | POA: Diagnosis not present

## 2017-11-03 DIAGNOSIS — E1140 Type 2 diabetes mellitus with diabetic neuropathy, unspecified: Secondary | ICD-10-CM | POA: Diagnosis not present

## 2017-11-03 DIAGNOSIS — I130 Hypertensive heart and chronic kidney disease with heart failure and stage 1 through stage 4 chronic kidney disease, or unspecified chronic kidney disease: Secondary | ICD-10-CM | POA: Diagnosis not present

## 2017-11-03 DIAGNOSIS — M2550 Pain in unspecified joint: Secondary | ICD-10-CM | POA: Diagnosis not present

## 2017-11-03 DIAGNOSIS — D508 Other iron deficiency anemias: Secondary | ICD-10-CM

## 2017-11-03 DIAGNOSIS — L89899 Pressure ulcer of other site, unspecified stage: Secondary | ICD-10-CM | POA: Diagnosis not present

## 2017-11-03 DIAGNOSIS — Z951 Presence of aortocoronary bypass graft: Secondary | ICD-10-CM | POA: Diagnosis not present

## 2017-11-03 DIAGNOSIS — K219 Gastro-esophageal reflux disease without esophagitis: Secondary | ICD-10-CM | POA: Diagnosis not present

## 2017-11-03 DIAGNOSIS — I5042 Chronic combined systolic (congestive) and diastolic (congestive) heart failure: Secondary | ICD-10-CM | POA: Diagnosis not present

## 2017-11-03 DIAGNOSIS — Z7401 Bed confinement status: Secondary | ICD-10-CM | POA: Diagnosis not present

## 2017-11-03 DIAGNOSIS — N183 Chronic kidney disease, stage 3 (moderate): Secondary | ICD-10-CM | POA: Diagnosis not present

## 2017-11-03 DIAGNOSIS — I2510 Atherosclerotic heart disease of native coronary artery without angina pectoris: Secondary | ICD-10-CM | POA: Diagnosis not present

## 2017-11-03 DIAGNOSIS — F0280 Dementia in other diseases classified elsewhere without behavioral disturbance: Secondary | ICD-10-CM | POA: Diagnosis not present

## 2017-11-03 DIAGNOSIS — A419 Sepsis, unspecified organism: Secondary | ICD-10-CM | POA: Diagnosis not present

## 2017-11-03 DIAGNOSIS — E538 Deficiency of other specified B group vitamins: Secondary | ICD-10-CM | POA: Diagnosis not present

## 2017-11-03 DIAGNOSIS — Z452 Encounter for adjustment and management of vascular access device: Secondary | ICD-10-CM | POA: Diagnosis not present

## 2017-11-03 DIAGNOSIS — Z9181 History of falling: Secondary | ICD-10-CM | POA: Diagnosis not present

## 2017-11-03 DIAGNOSIS — B9561 Methicillin susceptible Staphylococcus aureus infection as the cause of diseases classified elsewhere: Secondary | ICD-10-CM | POA: Diagnosis not present

## 2017-11-03 DIAGNOSIS — Z7982 Long term (current) use of aspirin: Secondary | ICD-10-CM | POA: Diagnosis not present

## 2017-11-03 DIAGNOSIS — E039 Hypothyroidism, unspecified: Secondary | ICD-10-CM | POA: Diagnosis not present

## 2017-11-03 DIAGNOSIS — M6281 Muscle weakness (generalized): Secondary | ICD-10-CM | POA: Diagnosis not present

## 2017-11-03 DIAGNOSIS — G9340 Encephalopathy, unspecified: Secondary | ICD-10-CM | POA: Diagnosis not present

## 2017-11-03 DIAGNOSIS — D509 Iron deficiency anemia, unspecified: Secondary | ICD-10-CM | POA: Diagnosis not present

## 2017-11-03 DIAGNOSIS — L89620 Pressure ulcer of left heel, unstageable: Secondary | ICD-10-CM | POA: Diagnosis not present

## 2017-11-03 DIAGNOSIS — K5900 Constipation, unspecified: Secondary | ICD-10-CM | POA: Diagnosis not present

## 2017-11-03 DIAGNOSIS — I5043 Acute on chronic combined systolic (congestive) and diastolic (congestive) heart failure: Secondary | ICD-10-CM | POA: Diagnosis not present

## 2017-11-03 DIAGNOSIS — R41841 Cognitive communication deficit: Secondary | ICD-10-CM | POA: Diagnosis not present

## 2017-11-03 DIAGNOSIS — J309 Allergic rhinitis, unspecified: Secondary | ICD-10-CM | POA: Diagnosis not present

## 2017-11-03 DIAGNOSIS — J811 Chronic pulmonary edema: Secondary | ICD-10-CM | POA: Diagnosis not present

## 2017-11-03 DIAGNOSIS — R2681 Unsteadiness on feet: Secondary | ICD-10-CM | POA: Diagnosis not present

## 2017-11-03 LAB — CBC WITH DIFFERENTIAL/PLATELET
Basophils Absolute: 0 10*3/uL (ref 0.0–0.1)
Basophils Relative: 1 %
EOS ABS: 0 10*3/uL (ref 0.0–0.7)
EOS PCT: 1 %
HCT: 24.8 % — ABNORMAL LOW (ref 39.0–52.0)
Hemoglobin: 8 g/dL — ABNORMAL LOW (ref 13.0–17.0)
LYMPHS ABS: 1.6 10*3/uL (ref 0.7–4.0)
LYMPHS PCT: 19 %
MCH: 33.1 pg (ref 26.0–34.0)
MCHC: 32.3 g/dL (ref 30.0–36.0)
MCV: 102.5 fL — AB (ref 78.0–100.0)
MONO ABS: 0.6 10*3/uL (ref 0.1–1.0)
Monocytes Relative: 8 %
Neutro Abs: 5.9 10*3/uL (ref 1.7–7.7)
Neutrophils Relative %: 71 %
PLATELETS: 314 10*3/uL (ref 150–400)
RBC: 2.42 MIL/uL — ABNORMAL LOW (ref 4.22–5.81)
RDW: 17.1 % — ABNORMAL HIGH (ref 11.5–15.5)
WBC: 8.2 10*3/uL (ref 4.0–10.5)

## 2017-11-03 LAB — BASIC METABOLIC PANEL
Anion gap: 7 (ref 5–15)
BUN: 22 mg/dL (ref 8–23)
CALCIUM: 7.8 mg/dL — AB (ref 8.9–10.3)
CO2: 30 mmol/L (ref 22–32)
Chloride: 102 mmol/L (ref 98–111)
Creatinine, Ser: 1.18 mg/dL (ref 0.61–1.24)
GFR calc Af Amer: 58 mL/min — ABNORMAL LOW (ref >60)
GFR, EST NON AFRICAN AMERICAN: 50 mL/min — AB (ref >60)
GLUCOSE: 132 mg/dL — AB (ref 70–99)
POTASSIUM: 4.3 mmol/L (ref 3.5–5.1)
SODIUM: 139 mmol/L (ref 135–145)

## 2017-11-03 LAB — RETICULOCYTES
RBC.: 2.42 MIL/uL — ABNORMAL LOW (ref 4.22–5.81)
RETIC CT PCT: 4.8 % — AB (ref 0.4–3.1)
Retic Count, Absolute: 116.2 10*3/uL (ref 19.0–186.0)

## 2017-11-03 LAB — FERRITIN: FERRITIN: 498 ng/mL — AB (ref 24–336)

## 2017-11-03 LAB — IRON AND TIBC
IRON: 26 ug/dL — AB (ref 45–182)
Saturation Ratios: 22 % (ref 17.9–39.5)
TIBC: 118 ug/dL — AB (ref 250–450)
UIBC: 92 ug/dL

## 2017-11-03 LAB — GLUCOSE, CAPILLARY
Glucose-Capillary: 134 mg/dL — ABNORMAL HIGH (ref 70–99)
Glucose-Capillary: 132 mg/dL — ABNORMAL HIGH (ref 70–99)

## 2017-11-03 LAB — FOLATE: Folate: 17.5 ng/mL (ref >5.9)

## 2017-11-03 LAB — VITAMIN B12: Vitamin B-12: 1256 pg/mL — ABNORMAL HIGH (ref 180–914)

## 2017-11-03 LAB — MAGNESIUM: MAGNESIUM: 1.7 mg/dL (ref 1.7–2.4)

## 2017-11-03 MED ORDER — FUROSEMIDE 20 MG PO TABS: 20.0000 mg | ORAL_TABLET | Freq: Every day | ORAL | Status: AC

## 2017-11-03 MED ORDER — HEPARIN SOD (PORK) LOCK FLUSH 100 UNIT/ML IV SOLN
250.0000 [IU] | INTRAVENOUS | Status: AC | PRN
  Administered 2017-11-03: 250 [IU]

## 2017-11-03 NOTE — Clinical Social Work Placement (Signed)
Patient returning to [facility] aware of patient's discharge and confirmed patient's ability to return. PTAR contacted, patient's family notified. Patient's RN can call report to [phone], packet complete. CSW signing off, no other needs identified at this time.  CLINICAL SOCIAL WORK PLACEMENT  NOTE  Date:  11/03/2017  Patient Details  Name: [name] [name] MRN: [id number] Date of Birth: [birth year]  Clinical Social Work is seeking post-discharge placement for this patient at the [place] level of care (*CSW will initial, date and re-position this form in  chart as items are completed):  Yes   Patient/family provided with [place] Work Department's list of facilities offering this level of care within the geographic area requested by the patient (or if unable, by the patient's family).  Yes   Patient/family informed of their freedom to choose among providers that offer the needed level of care, that participate in Medicare, Medicaid or managed care program needed by the patient, have an available bed and are willing to accept the patient.  Yes   Patient/family informed of [place]'s ownership interest in [facility] and [facility], as well as of the fact that they are under no obligation to receive care at these facilities.  PASRR submitted to EDS on       PASRR number received on       Existing PASRR number confirmed on 11/03/17     FL2 transmitted to all facilities in geographic area requested by pt/family on 11/03/17     FL2 transmitted to all facilities within larger geographic area on       Patient informed that his/her managed care company has contracts with or will negotiate with certain facilities, including the following:        Yes   Patient/family informed of bed offers received.  Patient chooses bed at [facility]     Physician recommends and patient chooses bed at      Patient to be transferred  to [facility] on 11/03/17.  Patient to be transferred to facility by PTAR     Patient family notified on 11/03/17 of transfer.  Name of family member notified:  Randy Openshaw     PHYSICIAN       Additional Comment:    _______________________________________________ Burnis Medin, LCSW 11/03/2017, 2:15 PM

## 2017-11-03 NOTE — Progress Notes (Signed)
Pt discharged, EMS dispatched, report called to [facility] at [facility]. Pt stable, PICC line capped. Pt dressed and prepared for pickup. SP, RN

## 2017-11-03 NOTE — Discharge Summary (Signed)
Physician Discharge Summary  [name] [name]  LNL:[id number]  DOB: 12-29-1918  DOA: 10/31/2017 PCP: Claretta Fraise, MD  Admit date: 10/31/2017 Discharge date: 11/03/2017  Admitted From: SNF  Disposition: SNF   Recommendations for Outpatient Follow-up:  1. Follow up with SNF provider at earliest convenience 2. Please obtain BMP/CBC in one week to monitor renal function and hemoglobin  Discharge Condition: Stable CODE STATUS: DNR Diet recommendation: Heart Healthy   Brief/Interim Summary: For full details see H&P/Progress note, but in brief, [name] [name] is a 84 year old male with past medical history significant for CAD status post CABG, chronic systolic CHF, hypertension, type 2 diabetes mellitus who presented to the emergency department complaining of shortness of breath and swelling of his bilateral lower extremity.  Upon ED evaluation patient was found to be an acute on chronic systolic CHF with elevated BNP and chest x-ray showing bilateral pleural effusions.  Patient was admitted with working diagnosis of CHF exacerbation and started on IV Lasix.  Subjective: Patient seen and examined, he is pleasantly confused and reporting feeling well.  His breathing is back to baseline.  Edema has resolved.  No acute events overnight.  Discharge Diagnoses/Hospital Course:  Acute on chronic combined systolic and diastolic CHF On 4/0/8144 shows EF of 25 to 30% Patient with great diuresis, I&O's were not well documented due to patient dementia/confusion, however patient have lost > 8 pounds during hospital stay.  No signs of fluid overload. Patient was treated with lasix IV, transitioned to oral Lasix. Will continue oral Lasix 20 mg daily. Continue metoprolol and losartan.  Monitor daily weights. Low-salt diet. Patient was successfully weaned off oxygen.   MSSA bacteremia to left foot ulcer Patient on Ancef end date November 09, 2017  Microcytic anemia - Iron deficiency  No signs of overt  bleeding, Will start in iron supplement  Check CBC in 4-6 weeks   CAD Asymptomatic, no chest pain Mild elevated troponin likely demand ischemia from CHF We will not pursue ischemic work-up at this time.  Continue ASA.  Hypokalemia - resolved  Repleted   On the day of the discharge the patient's vitals were stable, and no other acute medical condition were reported by patient. the patient was felt safe to be discharge to home.   Discharge Instructions  You were cared for by a hospitalist during your hospital stay. If you have any questions about your discharge medications or the care you received while you were in the hospital after you are discharged, you can call the unit and asked to speak with the hospitalist on call if the hospitalist that took care of you is not available. Once you are discharged, your primary care physician will handle any further medical issues. Please note that NO REFILLS for any discharge medications will be authorized once you are discharged, as it is imperative that you return to your primary care physician (or establish a relationship with a primary care physician if you do not have one) for your aftercare needs so that they can reassess your need for medications and monitor your lab values.  Discharge Instructions    (HEART FAILURE PATIENTS) Call MD:  Anytime you have any of the following symptoms: 1) 3 pound weight gain in 24 hours or 5 pounds in 1 week 2) shortness of breath, with or without a dry hacking cough 3) swelling in the hands, feet or stomach 4) if you have to sleep on extra pillows at night in order to breathe.   Complete by:  As directed    Call MD for:  difficulty breathing, headache or visual disturbances   Complete by:  As directed    Call MD for:  extreme fatigue   Complete by:  As directed    Call MD for:  hives   Complete by:  As directed    Call MD for:  persistant dizziness or light-headedness   Complete by:  As directed    Call MD  for:  persistant nausea and vomiting   Complete by:  As directed    Call MD for:  redness, tenderness, or signs of infection (pain, swelling, redness, odor or green/yellow discharge around incision site)   Complete by:  As directed    Call MD for:  severe uncontrolled pain   Complete by:  As directed    Call MD for:  temperature >100.4   Complete by:  As directed    Diet - low sodium heart healthy   Complete by:  As directed    Increase activity slowly   Complete by:  As directed      Allergies as of 11/03/2017   No Known Allergies     Medication List    STOP taking these medications   donepezil 10 MG tablet Commonly known as:  ARICEPT   glimepiride 2 MG tablet Commonly known as:  AMARYL   hydrALAZINE 25 MG tablet Commonly known as:  APRESOLINE   ipratropium-albuterol 0.5-2.5 (3) MG/3ML Soln Commonly known as:  DUONEB   mometasone-formoterol 100-5 MCG/ACT Aero Commonly known as:  DULERA   predniSONE 10 MG tablet Commonly known as:  DELTASONE     TAKE these medications   acetaminophen 325 MG tablet Commonly known as:  TYLENOL Take 2 tablets (650 mg total) by mouth every 6 (six) hours as needed for mild pain (or Fever >/= 101).   albuterol (2.5 MG/3ML) 0.083% nebulizer solution Commonly known as:  PROVENTIL Take 2.5 mg by nebulization 2 (two) times daily as needed for wheezing or shortness of breath.   aspirin 81 MG EC tablet Take 81 mg by mouth daily.   bisacodyl 10 MG suppository Commonly known as:  DULCOLAX Place 10 mg rectally daily as needed for moderate constipation.   ceFAZolin  IVPB Commonly known as:  ANCEF Inject 2 g into the vein every 12 (twelve) hours. Indication:  MSSA bacteremia Last Day of Therapy:  November 09, 2017 Labs - Once weekly:  CBC/D and BMP, Labs - Every other week:  ESR and CRP   famotidine 20 MG tablet Commonly known as:  PEPCID Take 1 tablet (20 mg total) by mouth 2 (two) times daily.   fluticasone 50 MCG/ACT nasal  spray Commonly known as:  FLONASE Place 2 sprays into both nostrils daily.   furosemide 20 MG tablet Commonly known as:  LASIX Take 1 tablet (20 mg total) by mouth daily. What changed:    when to take this  reasons to take this   heparin lock flush 100 UNIT/ML Soln injection Inject 500 Units into the vein every 12 (twelve) hours. Flush PICC line with 48m after IV ABT and NS flush   levothyroxine 50 MCG tablet Commonly known as:  SYNTHROID, LEVOTHROID TAKE 1 TABLET (50 MCG TOTAL) BY MOUTH DAILY.   losartan 50 MG tablet Commonly known as:  COZAAR Take 50 mg by mouth daily.   magnesium hydroxide 400 MG/5ML suspension Commonly known as:  MILK OF MAGNESIA Take 30 mLs by mouth daily as needed for mild constipation.  metoprolol succinate 25 MG 24 hr tablet Commonly known as:  TOPROL-XL Take 1 tablet (25 mg total) by mouth daily.   multivitamin with minerals Tabs tablet Take 1 tablet by mouth daily.   senna-docusate 8.6-50 MG tablet Commonly known as:  Senokot-S Take 1 tablet by mouth at bedtime as needed for mild constipation.      Contact information for after-discharge care    Destination    [facility] Preferred SNF .   Service:  Skilled Nursing Contact information: [address] [place] [phone]             No Known Allergies  Consultations:  None   Procedures/Studies: Dg Chest 2 View  Result Date: 10/31/2017 CLINICAL DATA:  Increase shortness of breath. History of respiratory failure, diabetes, and coronary artery disease. EXAM: CHEST - 2 VIEW COMPARISON:  Chest x-ray of October 02, 2017 FINDINGS: The lungs are borderline hypoinflated. There small bilateral pleural effusions. The retrocardiac region on the left is dense. The cardiac silhouette is enlarged. The pulmonary vascularity is prominent centrally. There are post CABG changes. There is calcification in the wall of the aortic arch. IMPRESSION: Findings  worrisome CHF. One cannot exclude basilar pneumonia in the appropriate clinical setting. There are small bilateral pleural effusions greatest on the right. Thoracic aortic atherosclerosis. Electronically Signed   By: David  Martinique M.D.   On: 10/31/2017 12:04    Discharge Exam: Vitals:   11/02/17 2052 11/03/17 0418  BP: 128/65 127/66  Pulse: 77 67  Resp: 18 20  Temp: 98.1 F (36.7 C) 98.7 F (37.1 C)  SpO2: 100% 95%   Vitals:   11/02/17 1307 11/02/17 2052 11/03/17 0418 11/03/17 0500  BP: 121/81 128/65 127/66   Pulse: 85 77 67   Resp: _0 Temp: 98.3 F (36.8 C) 98.1 F (36.7 C) 98.7 F (37.1 C)   TempSrc: Oral  Oral   SpO2: 95% 100% 95%   Weight:    79.7 kg  Height:        General: Pt is alert, awake, not in acute distress Cardiovascular: RRR, S1/S2 +, no rubs, no gallops Respiratory: CTA bilaterally, no wheezing, no rhonchi Abdominal: Soft, NT, ND, bowel sounds + Extremities: LE trace edema b/l   The results of significant diagnostics from this hospitalization (including imaging, microbiology, ancillary and laboratory) are listed below for reference.     Microbiology: Recent Results (from the past 240 hour(s))  Blood culture (routine x 2)     Status: None (Preliminary result)   Collection Time: 10/31/17 12:25 PM  Result Value Ref Range Status   Specimen Description   Final    BLOOD LEFT ANTECUBITAL Performed at [facility] [address], [place], [place] [address]    Special Requests   Final    BOTTLES DRAWN AEROBIC AND ANAEROBIC Blood Culture results may not be optimal due to an excessive volume of blood received in culture bottles Performed at [facility] [address], [place], [place] [address]    Culture   Final    NO GROWTH 2 DAYS Performed at [facility] [address] [place], [place] [address]    Report Status PENDING  Incomplete  Blood culture (routine x 2)     Status: None (Preliminary  result)   Collection Time: 10/31/17 12:26 PM  Result Value Ref Range Status   Specimen Description   Final    BLOOD RIGHT ANTECUBITAL  Performed at [facility] [place] [address], [place], [place] [address]    Special Requests   Final    BOTTLES DRAWN AEROBIC AND ANAEROBIC Blood Culture results may not be optimal due to an excessive volume of blood received in culture bottles Performed at [facility] [address], [place], [place] [address]    Culture   Final    NO GROWTH 2 DAYS Performed at [facility] [address], [place], [place] [address]    Report Status PENDING  Incomplete  MRSA PCR Screening     Status: Abnormal   Collection Time: 11/01/17  7:49 AM  Result Value Ref Range Status   MRSA by PCR POSITIVE (A) NEGATIVE Final    Comment:        The GeneXpert MRSA Assay (FDA approved for NASAL specimens only), is one component of a comprehensive MRSA colonization surveillance program. It is not intended to diagnose MRSA infection nor to guide or monitor treatment for MRSA infections. RESULT CALLED TO, READ BACK BY AND VERIFIED WITH: Mare Ferrari [id number] @ [place] Performed at [facility], [place] [address], [place], [place] [address]      Labs: BNP (last 3 results) Recent Labs    09/29/17 0942 10/31/17 1132  BNP 3,317.6* 4,765.4*   Basic Metabolic Panel: Recent Labs  Lab 10/31/17 1132 11/01/17 0032 11/02/17 0336 11/03/17 0405  NA 142 142 141 139  K 3.6 3.6 3.2* 4.3  CL 106 105 103 102  CO2 _0 GLUCOSE 241* 163* 133* 132*  BUN 26* 24* 23 22  CREATININE 1.21 1.05 1.10 1.18  CALCIUM 8.0* 7.9* 8.0* 7.8*  MG  --  1.9 1.8 1.7   Liver Function Tests: Recent Labs  Lab 10/31/17 1132 11/01/17 0032 11/02/17 0336  AST _1 ALT <5 5 <5  ALKPHOS 70 65 62  BILITOT 0.4 0.5 0.6  PROT 5.6* 5.2* 5.2*  ALBUMIN 2.2* 2.1* 2.1*   No results for input(s): LIPASE, AMYLASE  in the last 168 hours. No results for input(s): AMMONIA in the last 168 hours. CBC: Recent Labs  Lab 10/31/17 1132 11/01/17 0032 11/02/17 0336 11/03/17 0405  WBC 8.4 7.0 7.8 8.2  NEUTROABS  --   --  5.5 5.9  HGB 8.3* 7.8* 7.8* 8.0*  HCT 26.2* 24.8* 24.9* 24.8*  MCV 103.6* 103.8* 101.6* 102.5*  PLT 327 298 321 314   Cardiac Enzymes: Recent Labs  Lab 10/31/17 1132 10/31/17 1926 11/01/17 0032  TROPONINI 0.07* 0.08* 0.08*   BNP: Invalid input(s): POCBNP CBG: Recent Labs  Lab 11/02/17 1119 11/02/17 1632 11/02/17 2051 11/03/17 0737 11/03/17 1229  GLUCAP 168* 147* 124* 134* 132*   D-Dimer No results for input(s): DDIMER in the last 72 hours. Hgb A1c No results for input(s): HGBA1C in the last 72 hours. Lipid Profile No results for input(s): CHOL, HDL, LDLCALC, TRIG, CHOLHDL, LDLDIRECT in the last 72 hours. Thyroid function studies No results for input(s): TSH, T4TOTAL, T3FREE, THYROIDAB in the last 72 hours.  Invalid input(s): FREET3 Anemia work up Recent Labs    11/03/17 0405  VITAMINB12 1,256*  FOLATE 17.5  FERRITIN 498*  TIBC 118*  IRON 26*  RETICCTPCT 4.8*   Urinalysis    Component Value Date/Time   COLORURINE AMBER (A) 09/26/2017 1732   APPEARANCEUR CLEAR 09/26/2017 1732   APPEARANCEUR Clear 07/21/2016 1617   LABSPEC 1.020 09/26/2017 1732   PHURINE 5.0 09/26/2017  [place] 09/26/2017 1732   HGBUR NEGATIVE 09/26/2017 1732   BILIRUBINUR SMALL (A) 09/26/2017 1732   BILIRUBINUR Positive (A) 07/21/2016 1617   KETONESUR 5 (A) 09/26/2017 1732   PROTEINUR 30 (A) 09/26/2017 1732   UROBILINOGEN negative 03/12/2015 1619   NITRITE NEGATIVE 09/26/2017 1732   LEUKOCYTESUR NEGATIVE 09/26/2017 1732   LEUKOCYTESUR Negative 07/21/2016 1617   Sepsis Labs Invalid input(s): PROCALCITONIN,  WBC,  LACTICIDVEN Microbiology Recent Results (from the past 240 hour(s))  Blood culture (routine x 2)     Status: None (Preliminary result)   Collection  Time: 10/31/17 12:25 PM  Result Value Ref Range Status   Specimen Description   Final    BLOOD LEFT ANTECUBITAL Performed at [facility], [place] [address], [place], [place] [address]    Special Requests   Final    BOTTLES DRAWN AEROBIC AND ANAEROBIC Blood Culture results may not be optimal due to an excessive volume of blood received in culture bottles Performed at [facility] [address] [place], [place] [address]    Culture   Final    NO GROWTH 2 DAYS Performed at [facility] [address] [place], [place] [address]    Report Status PENDING  Incomplete  Blood culture (routine x 2)     Status: None (Preliminary result)   Collection Time: 10/31/17 12:26 PM  Result Value Ref Range Status   Specimen Description   Final    BLOOD RIGHT ANTECUBITAL Performed at [facility] [address] [place], [place] [address]    Special Requests   Final    BOTTLES DRAWN AEROBIC AND ANAEROBIC Blood Culture results may not be optimal due to an excessive volume of blood received in culture bottles Performed at [facility] [address], [place], [place] [address]    Culture   Final    NO GROWTH 2 DAYS Performed at [facility] [address] [place], [place] [address]    Report Status PENDING  Incomplete  MRSA PCR Screening     Status: Abnormal   Collection Time: 11/01/17  7:49 AM  Result Value Ref Range Status   MRSA by PCR POSITIVE (A) NEGATIVE Final    Comment:        The GeneXpert MRSA Assay (FDA approved for NASAL specimens only), is one component of a comprehensive MRSA colonization surveillance program. It is not intended to diagnose MRSA infection nor to guide or monitor treatment for MRSA infections. RESULT CALLED TO, READ BACK BY AND VERIFIED WITH: Mare Ferrari [id number] @ [place] Performed at [facility], [place] [address], [place], [place]  [address]    Time coordinating discharge: 32 minutes  SIGNED:  Chipper Oman, MD  [facility] 11/03/2017, 12:42 PM  Pager please text page via  [email or web address]  Note - This record has been created using [employer]. Chart creation errors have been sought, but may not always have been located. Such creation errors do not reflect on the standard of medical care.

## 2017-11-03 NOTE — NC FL2 (Signed)
[place] LEVEL OF CARE SCREENING TOOL     IDENTIFICATION  Patient Name: [name] [name] Birthdate: [birth year] Sex: male Admission Date (Current Location): 10/31/2017  [facility] and [place] Number:  Herbalist and Address:  [facility],  [place], [place]      Provider Number: [id number]  Attending Physician Name and Address:  Patrecia Pour, Christean Grief, MD  Relative Name and Phone Number:       Current Level of Care: Hospital Recommended Level of Care: [place] Prior Approval Number:    Date Approved/Denied:   PASRR Number: [id number]  Discharge Plan: SNF    Current Diagnoses: Patient Active Problem List   Diagnosis Date Noted  . Heart failure ([place]) 10/31/2017  . MSSA bacteremia 09/27/2017  . SIRS (systemic inflammatory response syndrome) ([place]) 09/26/2017  . Diabetes mellitus type II, non insulin dependent ([place]) 09/26/2017  . Anemia 09/26/2017  . Back pain 09/26/2017  . Pressure injury of deep tissue of left foot 09/26/2017  . CKD (chronic kidney disease), stage III ([place]) 09/26/2017  . Vitamin B12 deficiency 04/16/2016  . Late onset Alzheimer's disease without behavioral disturbance 01/14/2016  . Hypothyroidism 07/26/2014  . HTN (hypertension)   . Hyperlipidemia   . CAD (coronary artery disease)   . Diabetic neuropathy ([place])   . Carotid artery disease ([facility])     Orientation RESPIRATION BLADDER Height & Weight     Self  O2 Incontinent Weight: 175 lb 11.3 oz (79.7 kg) Height:  5\' 8"  (172.7 cm)  BEHAVIORAL SYMPTOMS/MOOD NEUROLOGICAL BOWEL NUTRITION STATUS        Diet(heart healthy/carb modified)  AMBULATORY STATUS COMMUNICATION OF NEEDS Skin   Extensive Assist Verbally Other (Comment)(Ecchymosis Arm;Abdomen;Leg  Right;Left; Bilateral)                       Personal Care Assistance Level of Assistance  Bathing, Feeding, Dressing Bathing Assistance: Maximum assistance Feeding assistance: Maximum  assistance Dressing Assistance: Maximum assistance     Functional Limitations Info  Sight, Hearing, Speech Sight Info: Impaired Hearing Info: Impaired Speech Info: Adequate    SPECIAL CARE FACTORS FREQUENCY  PT (By licensed PT), OT (By licensed OT)     PT Frequency: 5x/week OT Frequency: 5x/week            Contractures      Additional Factors Info  Code Status, Allergies Code Status Info: DNR Allergies Info: NKA           Current Medications (11/03/2017):  This is the current hospital active medication list Current Facility-Administered Medications  Medication Dose Route Frequency Provider Last Rate Last Dose  . 0.9 %  sodium chloride infusion   Intravenous PRN Elodia Florence., MD 10 mL/hr at 11/02/17 1400    . acetaminophen (TYLENOL) tablet 650 mg  650 mg Oral Q6H PRN Elodia Florence., MD      . albuterol (PROVENTIL) (2.5 MG/3ML) 0.083% nebulizer solution 2.5 mg  2.5 mg Nebulization BID PRN Elodia Florence., MD      . aspirin chewable tablet 324 mg  324 mg Oral Once Elodia Florence., MD      . aspirin EC tablet 81 mg  81 mg Oral Daily Elodia Florence., MD   81 mg at 11/03/17 0912  . bisacodyl (DULCOLAX) suppository 10 mg  10 mg Rectal Daily PRN Elodia Florence., MD      . ceFAZolin (  ANCEF) IVPB 2g/100 mL premix  2 g Intravenous Q8H Lavina Hamman, MD 200 mL/hr at 11/03/17 0509 2 g at 11/03/17 0509  . Chlorhexidine Gluconate Cloth 2 % PADS 6 each  6 each Topical Q0600 Lavina Hamman, MD   6 each at 11/03/17 [phone]  . famotidine (PEPCID) tablet 20 mg  20 mg Oral Daily Elodia Florence., MD   20 mg at 11/03/17 0912  . furosemide (LASIX) injection 40 mg  40 mg Intravenous BID Elodia Florence., MD   40 mg at 11/03/17 0912  . insulin aspart (novoLOG) injection 0-5 Units  0-5 Units Subcutaneous QHS Elodia Florence., MD      . insulin aspart (novoLOG) injection 0-9 Units  0-9 Units Subcutaneous TID WC Elodia Florence.,  MD   1 Units at 11/03/17 [phone]  . levothyroxine (SYNTHROID, LEVOTHROID) tablet 50 mcg  50 mcg Oral QAC breakfast Elodia Florence., MD   50 mcg at 11/03/17 0509  . losartan (COZAAR) tablet 50 mg  50 mg Oral Daily Elodia Florence., MD   50 mg at 11/03/17 0912  . magnesium hydroxide (MILK OF MAGNESIA) suspension 30 mL  30 mL Oral Daily PRN Elodia Florence., MD      . metoprolol succinate (TOPROL-XL) 24 hr tablet 25 mg  25 mg Oral Daily Elodia Florence., MD   25 mg at 11/03/17 0912  . multivitamin with minerals tablet 1 tablet  1 tablet Oral Daily Elodia Florence., MD   1 tablet at 11/02/17 1312  . mupirocin ointment (BACTROBAN) 2 % 1 application  1 application Nasal BID Lavina Hamman, MD   1 application at 15/94/58 0912  . senna-docusate (Senokot-S) tablet 1 tablet  1 tablet Oral QHS PRN Elodia Florence., MD      . sodium chloride flush (NS) 0.9 % injection 10-40 mL  10-40 mL Intracatheter PRN Elodia Florence., MD   20 mL at 11/02/17 5929     Discharge Medications: Please see discharge summary for a list of discharge medications.  Relevant Imaging Results:  Relevant Lab Results:   Additional Information SSN [id number]  Burnis Medin, LCSW

## 2017-11-03 NOTE — Care Management Important Message (Signed)
Important Message  Patient Details  Name: [place] Trawick Rossano MRN: [id number] Date of Birth: [birth year]   Medicare Important Message Given:  Yes    Kerin Salen 11/03/2017, 12:21 [place] Message  Patient Details  Name: Nestor Wieneke Luo MRN: [id number] Date of Birth: 07-Jun-1918   Medicare Important Message Given:  Yes    Kerin Salen 11/03/2017, 12:21 PM

## 2017-11-03 NOTE — Evaluation (Addendum)
Physical Therapy Evaluation Patient Details Name: [name] [name] MRN: [id number] DOB: 06-02-1918 Today's Date: 11/03/2017   History of Present Illness  84 yo male admitted with HF exac, MSSA bacteremia. Hx of dementia, CAD, HF, DM, CABG, wounds/eschar on L foot. Pt is from a SNF  Clinical Impression  On eval, pt required Mod assist for bed mobility and stand statically at bedside with a RW. Unable to safely pivot or ambulate with +1 assist. Multimodal cueing required during session. No family present. Recommend return to SNF. Found crawling bug on pt's pillow-made RN aware-RN in room to capture bug and place in container during session. Also noted bleeding under pt's left foot while he was sitting EOB-possibly from wound on foot-made NT aware. Will follow and progress activity as tolerated.     Follow Up Recommendations SNF    Equipment Recommendations  None recommended by PT    Recommendations for Other Services       Precautions / Restrictions Precautions Precautions: Fall Restrictions Weight Bearing Restrictions: No      Mobility  Bed Mobility Overal bed mobility: Needs Assistance Bed Mobility: Supine to Sit;Sit to Supine     Supine to sit: Mod assist;HOB elevated Sit to supine: Mod assist;HOB elevated   General bed mobility comments: assist for trunk and bil LEs. Multimodal cueing required.   Transfers Overall transfer level: Needs assistance Equipment used: Rolling walker (2 wheeled) Transfers: Sit to/from Stand Sit to Stand: Mod assist;From elevated surface         General transfer comment: highly elevated bed surface. Multimodal cueing required for safety, hand placement. Pt fatigues very quickly. Only able to stand for ~45 seconds -1 minute before fatigued.  Sit to stand x 2. Bowel incontinence.   Ambulation/Gait             General Gait Details: Nt-for safety reasons  Stairs            Wheelchair Mobility    Modified Rankin (Stroke Patients  Only)       Balance Overall balance assessment: History of Falls;Needs assistance         Standing balance support: Bilateral upper extremity supported Standing balance-Leahy Scale: Poor                               Pertinent Vitals/Pain Pain Assessment: Faces Faces Pain Scale: No hurt    Home Living Family/patient expects to be discharged to:: Skilled nursing facility                 Additional Comments: Wife with dementia, caregivers only assisting during the day    Prior Function Level of Independence: Needs assistance   Gait / Transfers Assistance Needed: using walker per chart  ADL's / Homemaking Assistance Needed: assist for bathing, dressing, meals        Hand Dominance        Extremity/Trunk Assessment   Upper Extremity Assessment Upper Extremity Assessment: Generalized weakness    Lower Extremity Assessment Lower Extremity Assessment: Generalized weakness    Cervical / Trunk Assessment Cervical / Trunk Assessment: Kyphotic  Communication   Communication: HOH  Cognition Arousal/Alertness: Awake/alert Behavior During Therapy: Restless Overall Cognitive Status: History of cognitive impairments - at baseline                                 General Comments: fidgety especially  with mittens off      General Comments      Exercises     Assessment/Plan    PT Assessment Patient needs continued PT services  PT Problem List Decreased strength;Decreased mobility;Decreased activity tolerance;Decreased balance;Decreased knowledge of use of DME;Decreased cognition;Decreased safety awareness       PT Treatment Interventions DME instruction;Gait training;Therapeutic activities;Therapeutic exercise;Patient/family education;Balance training;Functional mobility training    PT Goals (Current goals can be found in the Care Plan section)  Acute Rehab PT Goals Patient Stated Goal: pt unable to state PT Goal Formulation:  Patient unable to participate in goal setting Time For Goal Achievement: 11/17/17 Potential to Achieve Goals: Fair    Frequency Min 2X/week   Barriers to discharge        Co-evaluation               AM-PAC PT "6 Clicks" Daily Activity  Outcome Measure Difficulty turning over in bed (including adjusting bedclothes, sheets and blankets)?: A Lot Difficulty moving from lying on back to sitting on the side of the bed? : Unable Difficulty sitting down on and standing up from a chair with arms (e.g., wheelchair, bedside commode, etc,.)?: Unable Help needed moving to and from a bed to chair (including a wheelchair)?: Total Help needed walking in hospital room?: Total Help needed climbing 3-5 steps with a railing? : Total 6 Click Score: 7    End of Session Equipment Utilized During Treatment: Gait belt Activity Tolerance: Patient limited by fatigue Patient left: in bed;with call bell/phone within reach;with bed alarm set;with restraints reapplied   PT Visit Diagnosis: Muscle weakness (generalized) (M62.81);History of falling (Z91.81);Unsteadiness on feet (R26.81);Difficulty in walking, not elsewhere classified (R26.2)    Time: 3664-4034 PT Time Calculation (min) (ACUTE ONLY): 42 min   Charges:   PT Evaluation $PT Eval Moderate Complexity: 1 Mod PT Treatments $Therapeutic Activity: 23-37 mins          Weston Anna, MPT Pager: [phone]

## 2017-11-05 LAB — CULTURE, BLOOD (ROUTINE X 2)
Culture: NO GROWTH
Culture: NO GROWTH

## 2017-11-08 ENCOUNTER — Encounter: Payer: Self-pay | Admitting: Family

## 2017-11-08 ENCOUNTER — Ambulatory Visit (INDEPENDENT_AMBULATORY_CARE_PROVIDER_SITE_OTHER): Payer: Medicare Other | Admitting: Family

## 2017-11-08 VITALS — BP 119/74 | HR 62 | Temp 97.8°F | Resp 18

## 2017-11-08 DIAGNOSIS — L89620 Pressure ulcer of left heel, unstageable: Secondary | ICD-10-CM

## 2017-11-08 DIAGNOSIS — R7881 Bacteremia: Secondary | ICD-10-CM

## 2017-11-08 DIAGNOSIS — B9561 Methicillin susceptible Staphylococcus aureus infection as the cause of diseases classified elsewhere: Secondary | ICD-10-CM

## 2017-11-08 NOTE — Progress Notes (Signed)
Subjective:    Patient ID: [name] [name], male    DOB: 05/25/1918, 84 y.o.   MRN: [id number]  Chief Complaint  Patient presents with  . MSSA Bacteremia    HPI:  [name] [name] is a 84 y.o. male who presents today for initial office visit following hospitalization for MSSA bacteremia.   [name] [name] was recently admitted to the hospital on 7/1 following mechanical fall 2 days prior to arrival resulting in increased back pain. His been noted by his 24-hour caregiver that he has been sleeping more than previous. His health had been steadily declining over the past several weeks prior to admission. Workup was mostly unremarkable with the exception of blood cultures turning positive for MSSA for which she was treated with Ancef. He did have a wound located on his left foot with x-ray showing no significant signs of osteomyelitis. CT scan of the abdomen and pelvis were without abnormalities. MRI of the foot was limited but there was no obvious findings for septic arthritis or osteomyelitis. There was diffuse cellulitis. Family decided to not seek further testing therefore a MRI of the spine and TEE were not completed. TTE with severe left ventricular dysfunction. No evidence of vegetation. A PICC line was inserted with the goal of treatment of 6 weeks of IV therapy. All hospital records, labs and imaging were reviewed in detail.   [name] [name] has been receiving his Ancef as prescribed with no adverse side effects. He is scheduled to complete his antimicrobial therapy on 10/1417. He was recently hospitalized for an exacerbation of acute on chronic systolic and diastolic heart failure. His daughter indicates that his appetite is significantly decreased recently and he has been sleeping more often throughout the day. He does have a new wound located on the medial aspect of his left calcaneous that she indicates was from lying his heels on the bed. He does have pressure reducing boots, but they are hardly used at his  skilled facility. He has not had any fevers, chills or sweats that she is aware of.    No Known Allergies    Outpatient Medications Prior to Visit  Medication Sig Dispense Refill  . acetaminophen (TYLENOL) 325 MG tablet Take 2 tablets (650 mg total) by mouth every 6 (six) hours as needed for mild pain (or Fever >/= 101).    Marland Kitchen albuterol (PROVENTIL) (2.5 MG/3ML) 0.083% nebulizer solution Take 2.5 mg by nebulization 2 (two) times daily as needed for wheezing or shortness of breath.    Marland Kitchen aspirin 81 MG EC tablet Take 81 mg by mouth daily.     . bisacodyl (DULCOLAX) 10 MG suppository Place 10 mg rectally daily as needed for moderate constipation.    Marland Kitchen ceFAZolin (ANCEF) IVPB Inject 2 g into the vein every 12 (twelve) hours. Indication:  MSSA bacteremia Last Day of Therapy:  November 09, 2017 Labs - Once weekly:  CBC/D and BMP, Labs - Every other week:  ESR and CRP 42 Units 0  . famotidine (PEPCID) 20 MG tablet Take 1 tablet (20 mg total) by mouth 2 (two) times daily. 60 tablet 11  . fluticasone (FLONASE) 50 MCG/ACT nasal spray Place 2 sprays into both nostrils daily. 16 g 6  . furosemide (LASIX) 20 MG tablet Take 1 tablet (20 mg total) by mouth daily. 30 tablet   . heparin lock flush 100 UNIT/ML SOLN injection Inject 500 Units into the vein every 12 (twelve) hours. Flush PICC line with 76m after IV ABT and  NS flush    . levothyroxine (SYNTHROID, LEVOTHROID) 50 MCG tablet TAKE 1 TABLET (50 MCG TOTAL) BY MOUTH DAILY. 90 tablet 2  . losartan (COZAAR) 50 MG tablet Take 50 mg by mouth daily.    . magnesium hydroxide (MILK OF MAGNESIA) 400 MG/5ML suspension Take 30 mLs by mouth daily as needed for mild constipation.    . metoprolol succinate (TOPROL-XL) 25 MG 24 hr tablet Take 1 tablet (25 mg total) by mouth daily.    . Multiple Vitamin (MULTIVITAMIN WITH MINERALS) TABS tablet Take 1 tablet by mouth daily.    Marland Kitchen senna-docusate (SENOKOT-S) 8.6-50 MG tablet Take 1 tablet by mouth at bedtime as needed for  mild constipation.     No facility-administered medications prior to visit.      Past Medical History:  Diagnosis Date  . CAD (coronary artery disease) 2002   Nuclear, January, 2011, no ischemia  . Carotid artery disease ([place])    Doppler, February, 2012, 0-39% bilateral  . CKD (chronic kidney disease), stage III ([place]) 09/26/2017  . Diabetes mellitus type II, non insulin dependent ([place]) 09/26/2017  . Diabetic neuropathy ([facility])    Possible  . Ejection fraction    EF 50-55%, echo, January, 2011, hypokinesis mid--base inferolateral  . Heart failure ([place]) 10/31/2017  . HTN (hypertension)   . Hx of CABG    1998  . Hyperlipidemia   . Hypothyroidism 07/26/2014  . Late onset Alzheimer's disease without behavioral disturbance 01/14/2016  . Respiratory failure ([place])    vent dependent...acquired pneumonia...2008  . Shortness of breath    2011      Past Surgical History:  Procedure Laterality Date  . CARPAL TUNNEL RELEASE    . CORONARY ARTERY BYPASS GRAFT    . KNEE ARTHROPLASTY     total  . LAPAROSCOPIC APPENDECTOMY N/A 01/10/2013   Procedure: APPENDECTOMY LAPAROSCOPIC;  Surgeon: Donato Heinz, MD;  Location: [facility];  Service: General;  Laterality: N/A;      Family History  Problem Relation Age of Onset  . Heart disease Father        mother  . Heart attack Father 22       deceased  . Heart failure Mother 66       deceased      Social History   Socioeconomic History  . Marital status: Married    Spouse name: Not on file  . Number of children: Not on file  . Years of education: Not on file  . Highest education level: Not on file  Occupational History  . Occupation: Academic librarian: RETIRED  Social Needs  . Financial resource strain: Not on file  . Food insecurity:    Worry: Not on file    Inability: Not on file  . Transportation needs:    Medical: Not on file    Non-medical: Not on file  Tobacco Use  . Smoking status: Never Smoker  . Smokeless tobacco:  Never Used  Substance and Sexual Activity  . Alcohol use: No  . Drug use: No  . Sexual activity: Not on file  Lifestyle  . Physical activity:    Days per week: Not on file    Minutes per session: Not on file  . Stress: Not on file  Relationships  . Social connections:    Talks on phone: Not on file    Gets together: Not on file    Attends religious service: Not on file    Active member of  club or organization: Not on file    Attends meetings of clubs or organizations: Not on file    Relationship status: Not on file  . Intimate partner violence:    Fear of current or ex partner: Not on file    Emotionally abused: Not on file    Physically abused: Not on file    Forced sexual activity: Not on file  Other Topics Concern  . Not on file  Social History Narrative  . Not on file      Review of Systems  Unable to perform ROS: Dementia       Objective:    BP 119/74   Pulse 62   Temp 97.8 F (36.6 C)   Resp 18 Comment: 3 L O2  SpO2 99%  Nursing note and vital signs reviewed.  Physical Exam  Constitutional: He appears well-developed and well-nourished. He appears lethargic. No distress.  Lethargic, arousal to voice.   Cardiovascular: Normal rate, regular rhythm, normal heart sounds and intact distal pulses.  Pulmonary/Chest: Effort normal and breath sounds normal. No stridor. No respiratory distress. He has no wheezes. He has no rales. He exhibits no tenderness.  Neurological: He appears lethargic. He is disoriented.  Skin: Skin is warm and dry.  Pressure wound of the left foot located on the medial aspect of the calcaneous that is covered with eschar. No obvious redness, discharge or odors are present.   Psychiatric: He has a normal mood and affect. His behavior is normal. Judgment and thought content normal.           Assessment & Plan:   Problem List Items Addressed This Visit      Musculoskeletal and Integument   Pressure injury of left heel, unstageable  ([place])    [name] [name] has a new unstageable pressure ulcer located on the medial aspect of the left heel that does not appear infected. Depending upon goals of care would recommend a consult to wound care for possible debridement. Daughter will consider.        Other   MSSA bacteremia - Primary    [name] [name] was diagnosed with MSSA bacteremia with unclear source and was treated with 6 weeks of antibiotics as endocarditis or vertebral osteomyelitis could not be ruled out. He will be completing treatment tomorrow. I do not think any further treatment is necessary at this time. I did discuss with his daughter the possibility of adding a pallative consult which she declined at this time. He does have a DNR in place and family has discussed goals of care. I am happy to see [name] [name] back as needed.           I am having [name] Pigeon. Masten maintain his aspirin, fluticasone, famotidine, levothyroxine, ceFAZolin, acetaminophen, metoprolol succinate, senna-docusate, albuterol, bisacodyl, heparin lock flush, losartan, multivitamin with minerals, magnesium hydroxide, and furosemide.   Follow-up: Return if symptoms worsen or fail to improve.    Terri Piedra, MSN, FNP-C Nurse Practitioner [facility] [place] [facility] Office phone: [phone] Pager: [place] number: [phone]

## 2017-11-08 NOTE — Patient Instructions (Addendum)
Nice to see you.  Complete the antibiotics as prescribed.   We will ensure PICC line is scheduled to be removed.  Elevate heel off the bed - use boots as prescribed.   Continue with current wound care.

## 2017-11-09 ENCOUNTER — Encounter: Payer: Self-pay | Admitting: Family

## 2017-11-09 ENCOUNTER — Telehealth: Payer: Self-pay

## 2017-11-09 DIAGNOSIS — L89620 Pressure ulcer of left heel, unstageable: Secondary | ICD-10-CM | POA: Insufficient documentation

## 2017-11-09 NOTE — Assessment & Plan Note (Signed)
Mr. [name] [name] was diagnosed with MSSA bacteremia with unclear source and was treated with 6 weeks of antibiotics as endocarditis or vertebral osteomyelitis could not be ruled out. He will be completing treatment tomorrow. I do not think any further treatment is necessary at this time. I did discuss with his daughter the possibility of adding a pallative consult which she declined at this time. He does have a DNR in place and family has discussed goals of care. I am happy to see Mr. [name] [name] back as needed.

## 2017-11-09 NOTE — Telephone Encounter (Signed)
Entry error

## 2017-11-09 NOTE — Telephone Encounter (Signed)
[name] [name], no answer so I left a message to return call. Eduard Clos, LPN

## 2017-11-09 NOTE — Assessment & Plan Note (Signed)
[name] [name] has a new unstageable pressure ulcer located on the medial aspect of the left heel that does not appear infected. Depending upon goals of care would recommend a consult to wound care for possible debridement. Daughter will consider.

## 2017-11-09 NOTE — Telephone Encounter (Signed)
Call returned from [facility]. I explained to her that Mr.Gramling has new orders per Terri Piedra NP. Juliann Pulse transferred call to Gara Kroner RN who preferred orders to be faxed to facility. Written orders to discontinue picc line faxed to [facility] at [phone]. Patient and Daughter made aware of new orders at recent office visit on 12/09/17. Eduard Clos, LPN

## 2017-11-23 ENCOUNTER — Ambulatory Visit: Payer: Medicare Other | Admitting: Cardiology

## 2017-12-14 ENCOUNTER — Ambulatory Visit: Payer: Medicare Other | Admitting: Family Medicine

## 2017-12-27 DEATH — deceased

## 2019-01-09 IMAGING — CR DG LUMBAR SPINE COMPLETE 4+V
5 series · 5 of 5 positions shown · non-contrast
Comparison: CT abdomen and pelvis 09/26/2017

CLINICAL DATA: Bacteremia.  Back pain

EXAM:
LUMBAR SPINE - COMPLETE 4+ VIEW

[l-spine ap]
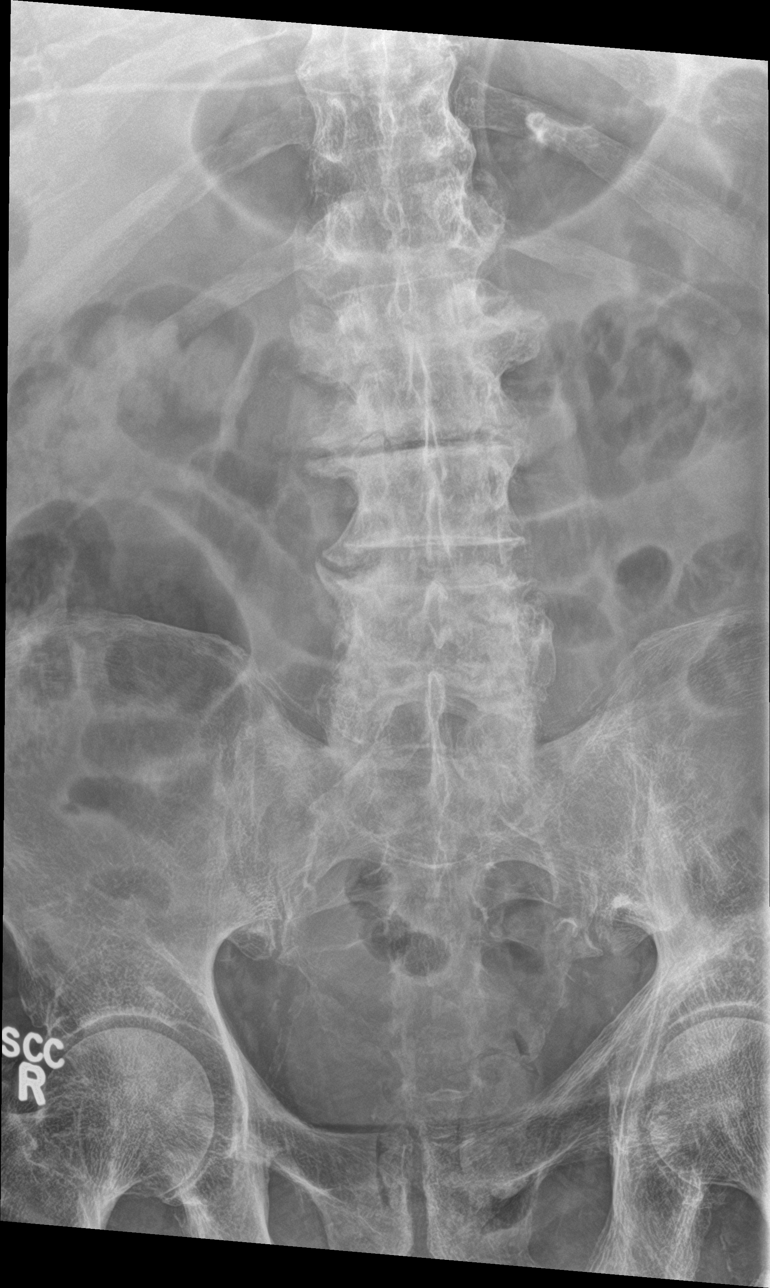

[l-spine obl (1 of 2)]
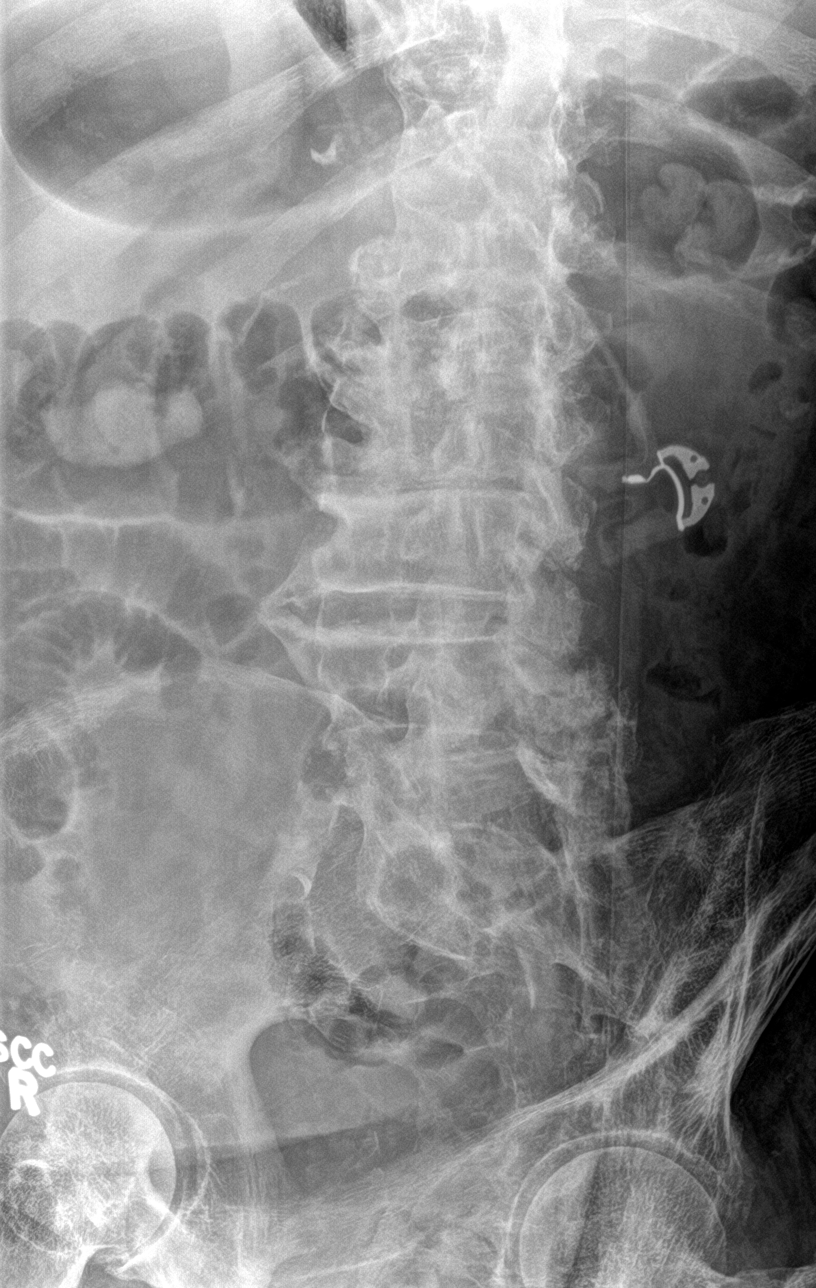

[l-spine obl (2 of 2)]
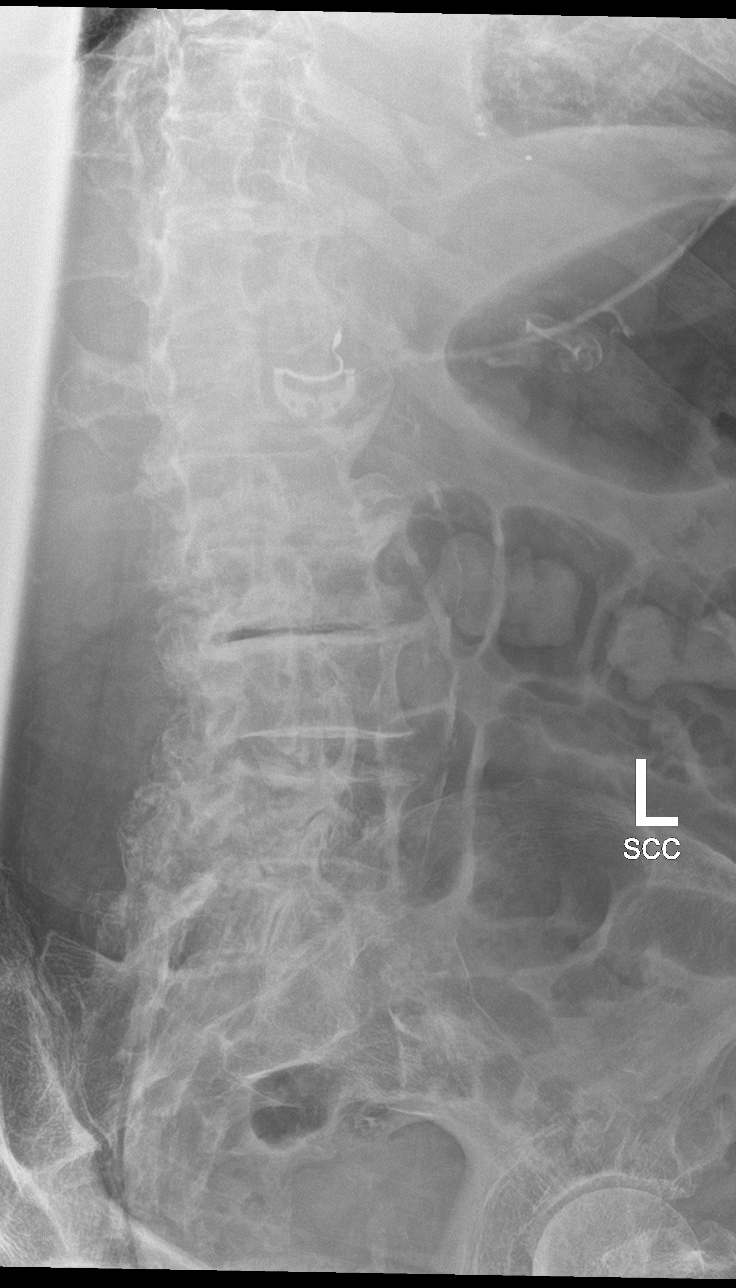

[l-spine lat]
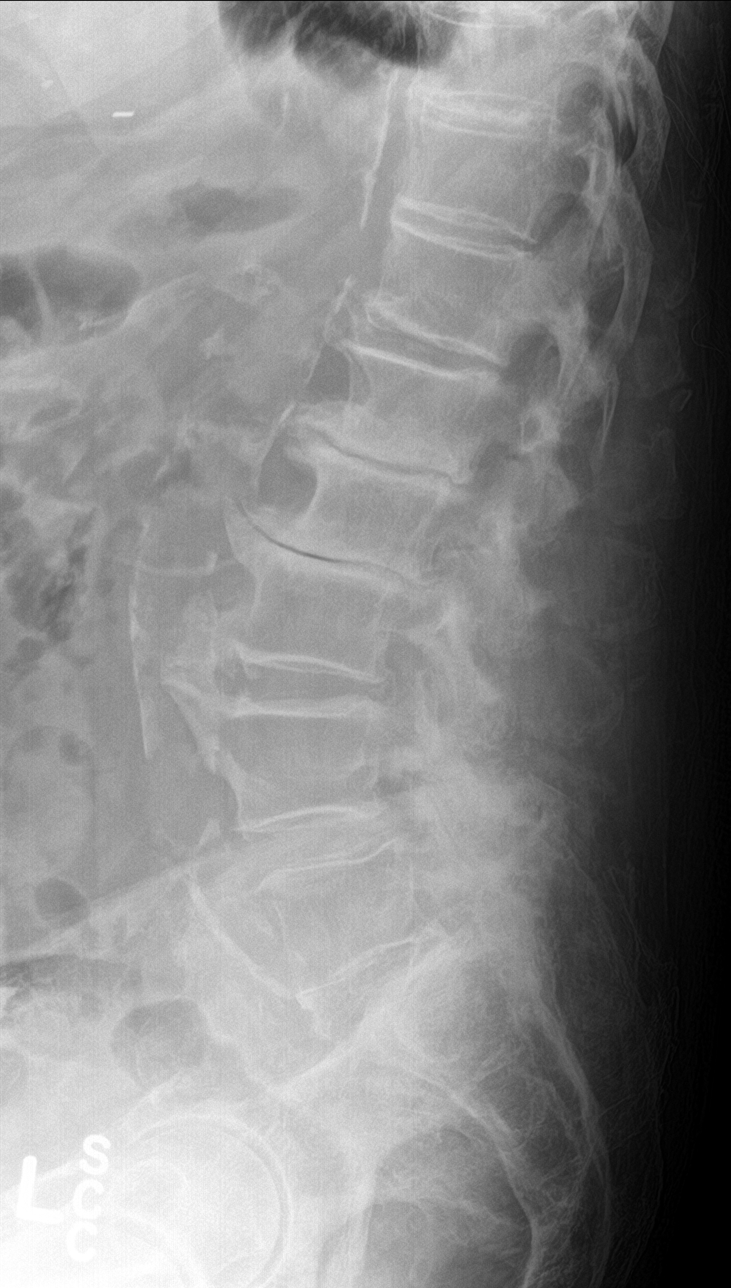

[l-spine spot]
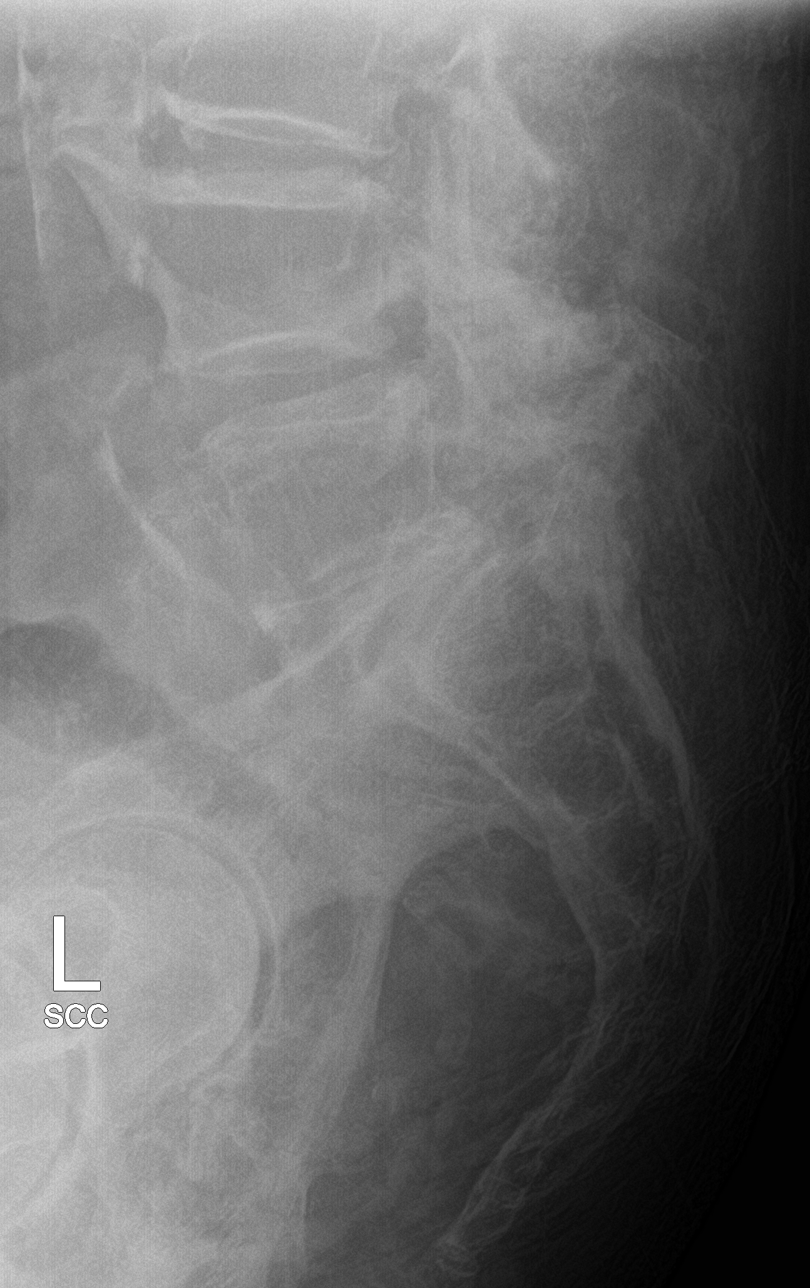

[5 of 5 positions shown; findings below may reference images not displayed]

FINDINGS: Moderate to advanced diffuse degenerative disc disease, most
pronounced at L1-2 and L2-3 with disc space narrowing, spurring,
endplate sclerosis and vacuum disc. Diffuse degenerative facet
disease, most pronounced in the lower lumbar spine. Slight
anterolisthesis of L4 on L5 related to facet disease. No fracture.
SI joints are symmetric and unremarkable.
IMPRESSION: Moderate to advanced degenerative disc and facet disease. No acute
findings.
